# Patient Record
Sex: Male | Born: 1955
Health system: Southern US, Community
[De-identification: ages and names within clinical notes are randomized; demographics above are authoritative.]

## PROBLEM LIST (undated history)

## (undated) DIAGNOSIS — K635 Polyp of colon: Secondary | ICD-10-CM

## (undated) DIAGNOSIS — R011 Cardiac murmur, unspecified: Secondary | ICD-10-CM

## (undated) DIAGNOSIS — K219 Gastro-esophageal reflux disease without esophagitis: Secondary | ICD-10-CM

## (undated) DIAGNOSIS — I341 Nonrheumatic mitral (valve) prolapse: Secondary | ICD-10-CM

## (undated) DIAGNOSIS — I340 Nonrheumatic mitral (valve) insufficiency: Secondary | ICD-10-CM

## (undated) DIAGNOSIS — I2510 Atherosclerotic heart disease of native coronary artery without angina pectoris: Secondary | ICD-10-CM

## (undated) DIAGNOSIS — I4819 Other persistent atrial fibrillation: Secondary | ICD-10-CM

## (undated) DIAGNOSIS — I4891 Unspecified atrial fibrillation: Secondary | ICD-10-CM

## (undated) HISTORY — DX: Polyp of colon: K63.5

## (undated) HISTORY — DX: Cardiac murmur, unspecified: R01.1

## (undated) HISTORY — PX: POLYPECTOMY: SHX149

## (undated) HISTORY — DX: Gastro-esophageal reflux disease without esophagitis: K21.9

## (undated) HISTORY — DX: Unspecified atrial fibrillation: I48.91

## (undated) HISTORY — DX: Nonrheumatic mitral (valve) insufficiency: I34.0

## (undated) HISTORY — PX: COLONOSCOPY: SHX174

## (undated) HISTORY — PX: CARDIAC CATHETERIZATION: SHX172

## (undated) HISTORY — PX: UPPER GASTROINTESTINAL ENDOSCOPY: SHX188

---

## 1980-12-11 HISTORY — PX: APPENDECTOMY: SHX54

## 2003-08-30 ENCOUNTER — Emergency Department (HOSPITAL_COMMUNITY): Admission: EM | Admit: 2003-08-30 | Discharge: 2003-08-30 | Payer: Self-pay | Admitting: Emergency Medicine

## 2003-09-04 ENCOUNTER — Ambulatory Visit (HOSPITAL_COMMUNITY): Admission: RE | Admit: 2003-09-04 | Discharge: 2003-09-04 | Payer: Self-pay | Admitting: *Deleted

## 2004-10-27 ENCOUNTER — Ambulatory Visit: Payer: Self-pay | Admitting: Gastroenterology

## 2006-12-11 HISTORY — PX: OTHER SURGICAL HISTORY: SHX169

## 2006-12-11 HISTORY — PX: HIP SURGERY: SHX245

## 2007-04-08 ENCOUNTER — Ambulatory Visit: Payer: Self-pay | Admitting: Gastroenterology

## 2008-04-07 ENCOUNTER — Encounter: Admission: RE | Admit: 2008-04-07 | Discharge: 2008-04-07 | Payer: Self-pay | Admitting: Orthopedic Surgery

## 2008-06-10 DIAGNOSIS — D126 Benign neoplasm of colon, unspecified: Secondary | ICD-10-CM

## 2008-06-10 HISTORY — DX: Benign neoplasm of colon, unspecified: D12.6

## 2008-06-22 ENCOUNTER — Ambulatory Visit: Payer: Self-pay | Admitting: Gastroenterology

## 2008-07-08 ENCOUNTER — Ambulatory Visit: Payer: Self-pay | Admitting: Gastroenterology

## 2008-07-08 ENCOUNTER — Encounter: Payer: Self-pay | Admitting: Gastroenterology

## 2008-08-06 ENCOUNTER — Encounter: Payer: Self-pay | Admitting: Gastroenterology

## 2011-01-01 ENCOUNTER — Encounter: Payer: Self-pay | Admitting: Orthopedic Surgery

## 2011-04-28 NOTE — Assessment & Plan Note (Signed)
[facility]                         GASTROENTEROLOGY OFFICE NOTE   NAME:Daniel Browning, Daniel Browning                        MRN:          [id number]  DATE:04/08/2007                            DOB:          Apr 28, 1956    This is a return office visit for GERD.  Daniel Browning recently switched to  generic pantoprazole and noted several days where his symptoms did not  appear to be adequately controlled, but this resolved and now his  symptoms remain under very good control.  He has no dysphagia,  odynophagia, abdominal pain, change in bowel habits, weight loss, melena  or hematochezia.  He has not previously had upper endoscopy.   CURRENT MEDICATIONS:  1. Aspirin 81 mg daily.  2. Pantoprazole 40 mg daily.  3. Fexofenadine 80 mg daily p.r.n.   ALLERGIES:  Medication allergies:  None known.   PHYSICAL EXAMINATION:  No acute distress.  Weight 187 pounds, blood  pressure 112/78, pulse 68 and regular.  CHEST:  Clear to auscultation bilaterally.  CARDIAC:  Regular rate and rhythm without murmurs.  ABDOMEN:  Soft, nontender, nondistended, normoactive bowel sounds.  No  palpable organomegaly, masses or hernias.   ASSESSMENT AND PLAN:  1. GERD.  Symptoms under excellent control.  Continue standard anti-      reflux measures and pantoprazole 40 mg p.o. q.a.m.  Refills for 2      years supplied.  Plan for return office visit in 2 years.  If his      symptoms remain under excellent control he may try to decrease his      dosing by changing to every other day and if his symptoms remain      under good control every third day.  If he tolerates this regimen      he can try discontinuing the medication.  If he remains on      medication to control reflux, consider upper endoscopy within the      next few years to screen for Barrett's esophagus.  2. Colorectal cancer screening.  Average risk.  Colonoscopy      recommended at age 29.     Venita Lick. Russella Dar, MD, [facility]  Electronically  Signed    MTS/MedQ  DD: 04/08/2007  DT: 04/08/2007  Job #: [id number]

## 2011-04-28 NOTE — Cardiovascular Report (Signed)
NAME:  Daniel Browning, Daniel Browning                           ACCOUNT NO.:  [id number]   MEDICAL RECORD NO.:  [id number]                   PATIENT TYPE:  OIB   LOCATION:  2899                                 FACILITY:  [facility]   PHYSICIAN:  Darlin Priestly, M.D.             DATE OF BIRTH:  [birth year]   DATE OF PROCEDURE:  09/04/2003  DATE OF DISCHARGE:                              CARDIAC CATHETERIZATION   PROCEDURES PERFORMED:  1. Left heart catheterization.  2. Coronary angiography.  3. Left ventriculogram.  4. Ascending aortography.   COMPLICATIONS:  None.   INDICATIONS:  Mr. Sayre is a 55 year old male patient of Meredith Staggers, M.D. with a positive family history of CAD who recently complained  of substernal chest pain after doing yard work.  This is somewhat atypical  and is worth with deep breathing and palpation of the area.  However,  continued to persist with associated nausea and diaphoresis.  We did ask him  to undergo Cardiolite scan on September 01, 2003 which revealed mild  anterolateral ischemia at the apex with normal EF.  He is now referred for  cardiac catheterization to define his coronary status.   DESCRIPTION OF OPERATION:  After giving informed written consent patient was  brought to the cardiac catheterization laboratory.  Right and left groin  shaved, prepped and draped in usual sterile fashion.  ECG monitor  established.  Using a modified Seldinger technique, a number 6-French  arterial sheath inserted in right femoral artery.  A 6-French diagnostic  catheter was then used to perform diagnostic angiography.  This reveals a  large left main with no significant disease.  The LAD is a large sized  vessel which coursed the apex, gave rise to two diagonal branches.  There is  mild calcification in the proximal part of the LAD.  There is mild 30%  narrowing in the proximal LAD with 40% mid LAD narrowing.  The first  diagonal is a large vessel which bifurcates  distally, has a 40% ostial  narrowing.  The second diagonal is a medium sized vessel with no significant  disease.   Left circumflex is a large vessel coursing the AV groove and gave rise to  two obtuse marginal branches.  AV groove circumflex has no significant  disease.  First OM is a large vessel which bifurcates distally.  Has no  significant disease.  The second OM is a large vessel bifurcates in its  proximal portion with no significant disease.   The right coronary artery is a medium sized vessel which is dominant.  Gives  rise to a PDA/posterolateral branch.  There is no significant disease in the  RCA, PDA, or posterolateral branch.   Left ventriculogram reveals low normal EF at 50%.  There did not appear to  be any segmental wall motion abnormalities noted.   Ascending aortography reveals no evidence of aortic dissection or aortic  regurgitation.   HEMODYNAMICS:  Systemic arterial pressure 118/72, LV systemic pressure  117/7, LVEDP 13.   Following the case the right femoral site was then closed using AngioSeal  device to obtain hemostasis.  There were no complications.   CONCLUSION:  1. No significant coronary artery disease.  2. Normal left ventricular systolic function.  3. No evidence of aortic dissection.  4. Successful right femoral closure using an AngioSeal device.                                               Darlin Priestly, M.D.    RHM/MEDQ  D:  09/04/2003  T:  09/04/2003  Job:  [id number]   cc:   Meredith Staggers, M.D.  [address]  [place]  [place] [address]  Fax: [phone]

## 2013-01-07 ENCOUNTER — Other Ambulatory Visit: Payer: Self-pay | Admitting: Dermatology

## 2013-07-02 ENCOUNTER — Encounter: Payer: Self-pay | Admitting: Gastroenterology

## 2015-01-30 ENCOUNTER — Encounter: Payer: Self-pay | Admitting: Gastroenterology

## 2015-05-20 ENCOUNTER — Encounter: Payer: Self-pay | Admitting: Gastroenterology

## 2016-07-14 ENCOUNTER — Encounter: Payer: Self-pay | Admitting: Gastroenterology

## 2016-09-05 ENCOUNTER — Ambulatory Visit (AMBULATORY_SURGERY_CENTER): Payer: Self-pay | Admitting: *Deleted

## 2016-09-05 VITALS — Ht 70.0 in | Wt 189.0 lb

## 2016-09-05 DIAGNOSIS — Z86010 Personal history of colonic polyps: Secondary | ICD-10-CM

## 2016-09-05 MED ORDER — NA SULFATE-K SULFATE-MG SULF 17.5-3.13-1.6 GM/177ML PO SOLN: 1.0000 | Freq: Once | ORAL | 0 refills | Status: AC

## 2016-09-05 NOTE — Progress Notes (Signed)
No egg or soy allergy known to patient  No issues with past sedation with any surgeries  or procedures, no intubation problems  No diet pills per patient No home 02 use per patient  No blood thinners per patient  Pt denies issues with constipation  No A fib or A flutter   

## 2016-09-06 ENCOUNTER — Encounter: Payer: Self-pay | Admitting: Gastroenterology

## 2016-09-19 ENCOUNTER — Encounter: Payer: Self-pay | Admitting: Gastroenterology

## 2017-10-04 ENCOUNTER — Other Ambulatory Visit: Payer: Self-pay

## 2017-10-04 ENCOUNTER — Encounter (HOSPITAL_COMMUNITY): Payer: Self-pay | Admitting: *Deleted

## 2017-10-04 ENCOUNTER — Emergency Department (HOSPITAL_COMMUNITY)
Admission: EM | Admit: 2017-10-04 | Discharge: 2017-10-04 | Disposition: A | Discharge status: Home / Self Care | Payer: 59 | Attending: Emergency Medicine | Admitting: Emergency Medicine

## 2017-10-04 ENCOUNTER — Emergency Department (HOSPITAL_COMMUNITY): Payer: 59

## 2017-10-04 DIAGNOSIS — Z79899 Other long term (current) drug therapy: Secondary | ICD-10-CM | POA: Insufficient documentation

## 2017-10-04 DIAGNOSIS — R55 Syncope and collapse: Secondary | ICD-10-CM | POA: Diagnosis present

## 2017-10-04 LAB — URINALYSIS, ROUTINE W REFLEX MICROSCOPIC
Bilirubin Urine: NEGATIVE
Glucose, UA: NEGATIVE mg/dL
Hgb urine dipstick: NEGATIVE
Ketones, ur: NEGATIVE mg/dL
Leukocytes, UA: NEGATIVE
Nitrite: NEGATIVE
Protein, ur: 100 mg/dL — AB
Specific Gravity, Urine: 1.028 (ref 1.005–1.030)
pH: 5 (ref 5.0–8.0)

## 2017-10-04 LAB — CBC
HEMATOCRIT: 34.2 % — AB (ref 39.0–52.0)
Hemoglobin: 11.2 g/dL — ABNORMAL LOW (ref 13.0–17.0)
MCH: 26.5 pg (ref 26.0–34.0)
MCHC: 32.7 g/dL (ref 30.0–36.0)
MCV: 81 fL (ref 78.0–100.0)
Platelets: 231 10*3/uL (ref 150–400)
RBC: 4.22 MIL/uL (ref 4.22–5.81)
RDW: 13.4 % (ref 11.5–15.5)
WBC: 13.4 10*3/uL — ABNORMAL HIGH (ref 4.0–10.5)

## 2017-10-04 LAB — BASIC METABOLIC PANEL
Anion gap: 10 (ref 5–15)
BUN: 20 mg/dL (ref 6–20)
CO2: 26 mmol/L (ref 22–32)
Calcium: 9.3 mg/dL (ref 8.9–10.3)
Chloride: 102 mmol/L (ref 101–111)
Creatinine, Ser: 0.99 mg/dL (ref 0.61–1.24)
GFR calc Af Amer: >60 mL/min (ref >60)
GFR calc non Af Amer: >60 mL/min (ref >60)
Glucose, Bld: 100 mg/dL — ABNORMAL HIGH (ref 65–99)
Potassium: 4 mmol/L (ref 3.5–5.1)
Sodium: 138 mmol/L (ref 135–145)

## 2017-10-04 NOTE — ED Provider Notes (Signed)
[facility] EMERGENCY DEPARTMENT Provider Note   CSN: [id number] Arrival date & time: 10/04/17  1819     History   Chief Complaint Chief Complaint  Patient presents with  . Loss of Consciousness    HPI Daniel Browning is a 61 y.o. male.  Patient is a 61 year old male with no significant past medical history presenting for evaluation of syncope.  He was at work this evening receiving a massage that consisted of vigorous rubbing of his upper back and on deep inspiration.  After this was ongoing for several minutes, he became lightheaded, then experienced a syncopal episode.  He lost consciousness for approximately 30 seconds, then woke.  When waking, he was alert, appropriate.  There is no reported seizure-like activity and no bowel or bladder incontinence.  He now feels fine and has no complaints.  He denies any complaints leading up to this episode.   The history is provided by the patient.  Loss of Consciousness   This is a new problem. The current episode started less than 1 hour ago. The problem has been resolved. He lost consciousness for a period of less than one minute. Associated with: Massage as above. Pertinent negatives include chest pain, confusion, fever, headaches, palpitations and seizures. He has tried nothing for the symptoms.    Past Medical History:  Diagnosis Date  . Colon polyps   . GERD (gastroesophageal reflux disease)   . Heart murmur     There are no active problems to display for this patient.   Past Surgical History:  Procedure Laterality Date  . APPENDECTOMY  1982  . COLONOSCOPY    . HIP SURGERY Right 2008   8-9 screws- s/p 30 ft fall  . POLYPECTOMY    . radial head surgery Right 2008   from 30 ft. fall-   . UPPER GASTROINTESTINAL ENDOSCOPY         Home Medications    Prior to Admission medications   Medication Sig Start Date End Date Taking? Authorizing Provider  omeprazole (PRILOSEC OTC) 20 MG tablet Take 20 mg by  mouth daily.   Yes [provider]    Family History Family History  Problem Relation Age of Onset  . Lung cancer Mother 33  . Aneurysm Father 64  . Colon polyps Neg Hx   . Colon cancer Neg Hx   . Esophageal cancer Neg Hx   . Rectal cancer Neg Hx   . Stomach cancer Neg Hx     Social History Social History  Substance Use Topics  . Smoking status: Never Smoker  . Smokeless tobacco: Never Used  . Alcohol use Yes     Comment: occasional- 2-3 beers a month      Allergies   Patient has no known allergies.   Review of Systems Review of Systems  Constitutional: Negative for fever.  Cardiovascular: Positive for syncope. Negative for chest pain and palpitations.  Neurological: Negative for seizures and headaches.  Psychiatric/Behavioral: Negative for confusion.  All other systems reviewed and are negative.    Physical Exam Updated Vital Signs BP 134/85 (BP Location: Left Arm)   Pulse 61   Temp (!) 97.4 F (36.3 C) (Oral)   SpO2 100%   Physical Exam  Constitutional: He is oriented to person, place, and time. He appears well-developed and well-nourished. No distress.  HENT:  Head: Normocephalic and atraumatic.  Mouth/Throat: Oropharynx is clear and moist.  Eyes: Pupils are equal, round, and reactive to light. EOM are  normal.  Neck: Normal range of motion. Neck supple.  Cardiovascular: Normal rate and regular rhythm.  Exam reveals no friction rub.   No murmur heard. Pulmonary/Chest: Effort normal and breath sounds normal. No respiratory distress. He has no wheezes. He has no rales.  Abdominal: Soft. Bowel sounds are normal. He exhibits no distension. There is no tenderness.  Musculoskeletal: Normal range of motion. He exhibits no edema.  Neurological: He is alert and oriented to person, place, and time. No cranial nerve deficit. He exhibits normal muscle tone. Coordination normal.  Skin: Skin is warm and dry. He is not diaphoretic.  Nursing note and vitals  reviewed.    ED Treatments / Results  Labs (all labs ordered are listed, but only abnormal results are displayed) Labs Reviewed  BASIC METABOLIC PANEL - Abnormal; Notable for the following:       Result Value   Glucose, Bld 100 (*)    All other components within normal limits  CBC - Abnormal; Notable for the following:    WBC 13.4 (*)    Hemoglobin 11.2 (*)    HCT 34.2 (*)    All other components within normal limits  URINALYSIS, ROUTINE W REFLEX MICROSCOPIC - Abnormal; Notable for the following:    APPearance HAZY (*)    Protein, ur 100 (*)    Bacteria, UA RARE (*)    Squamous Epithelial / LPF 0-5 (*)    All other components within normal limits  CBG MONITORING, ED    EKG  EKG Interpretation  Date/Time:  Thursday October 04 2017 18:35:46 EDT Ventricular Rate:  58 PR Interval:  194 QRS Duration: 112 QT Interval:  424 QTC Calculation: 416 R Axis:   71 Text Interpretation:  Sinus bradycardia with occasional Premature ventricular complexes Incomplete left bundle branch block Nonspecific ST abnormality Abnormal ECG Confirmed by Veryl Speak [phone]) on 10/04/2017 11:21:45 PM       Radiology Dg Chest 2 View  Result Date: 10/04/2017 CLINICAL DATA:  Syncopal episode today. EXAM: CHEST  2 VIEW COMPARISON:  None. FINDINGS: The heart size and mediastinal contours are within normal limits. There is no focal infiltrate, pulmonary edema, or pleural effusion. The visualized skeletal structures are unremarkable. IMPRESSION: No active cardiopulmonary disease. Electronically Signed   By: Abelardo Diesel M.D.   On: 10/04/2017 19:36    Procedures Procedures (including critical care time)  Medications Ordered in ED Medications - No data to display   Initial Impression / Assessment and Plan / ED Course  I have reviewed the triage vital signs and the nursing notes.  Pertinent labs & imaging results that were available during my care of the patient were reviewed by me and considered  in my medical decision making (see chart for details).  Patient brought here after a syncopal episode that sounds vasovagal in nature.  He reports vigorous stimulation of his back muscles during a massage along with taking deep inspirations.  His workup reveals normal laboratory studies, normal EKG, and normal neurologic exam.  I see no indication for further workup.  Patient is feeling well and I believe is appropriate for discharge.  To return as needed/follow-up for any problems.  Final Clinical Impressions(s) / ED Diagnoses   Final diagnoses:  None    New Prescriptions New Prescriptions   No medications on file     Veryl Speak, MD 10/04/17 2341

## 2017-10-04 NOTE — Discharge Instructions (Signed)
Return to the emergency department if you experience any new or concerning symptoms.

## 2017-10-04 NOTE — ED Triage Notes (Addendum)
To ED for eval after having a syncopal episode during a massage while at work. No cp. No sob. Now feels nauseated. States he felt fine prior to massage. EMS on scene told pt his bp was low at 90/p. Wife states pt has had a cough over the past couple of days. Denies fever

## 2017-10-17 MED ORDER — FENTANYL CITRATE (PF) 100 MCG/2ML IJ SOLN
INTRAMUSCULAR | Status: AC
  Filled 2017-10-17: qty 2

## 2019-04-02 DIAGNOSIS — L309 Dermatitis, unspecified: Secondary | ICD-10-CM | POA: Diagnosis not present

## 2019-04-30 DIAGNOSIS — L309 Dermatitis, unspecified: Secondary | ICD-10-CM | POA: Diagnosis not present

## 2019-07-14 DIAGNOSIS — L2089 Other atopic dermatitis: Secondary | ICD-10-CM | POA: Diagnosis not present

## 2019-11-05 DIAGNOSIS — Z0000 Encounter for general adult medical examination without abnormal findings: Secondary | ICD-10-CM | POA: Diagnosis not present

## 2019-11-05 DIAGNOSIS — R079 Chest pain, unspecified: Secondary | ICD-10-CM | POA: Diagnosis not present

## 2019-11-05 DIAGNOSIS — Z125 Encounter for screening for malignant neoplasm of prostate: Secondary | ICD-10-CM | POA: Diagnosis not present

## 2019-11-05 DIAGNOSIS — R5383 Other fatigue: Secondary | ICD-10-CM | POA: Diagnosis not present

## 2019-11-10 DIAGNOSIS — E7800 Pure hypercholesterolemia, unspecified: Secondary | ICD-10-CM | POA: Diagnosis not present

## 2019-12-23 ENCOUNTER — Ambulatory Visit: Payer: Self-pay | Attending: Internal Medicine

## 2019-12-24 ENCOUNTER — Ambulatory Visit: Payer: Self-pay | Admitting: Cardiology

## 2019-12-30 DIAGNOSIS — Z23 Encounter for immunization: Secondary | ICD-10-CM | POA: Diagnosis not present

## 2020-01-06 NOTE — Progress Notes (Deleted)
    Patient referred by Daniel Arabian, MD for exertional chest pain  Subjective:   Daniel Browning, male    DOB: 1956/10/15, 64 y.o.   MRN: [id number]  *** Chief Complaint  Patient presents with  . exertional Chest Pain  . Heart Murmur    *** HPI  64 y.o. *** male with ***  *** Past Medical History:  Diagnosis Date  . Colon polyps   . GERD (gastroesophageal reflux disease)   . Heart murmur     *** Past Surgical History:  Procedure Laterality Date  . APPENDECTOMY  1982  . COLONOSCOPY    . HIP SURGERY Right 2008   8-9 screws- s/p 30 ft fall  . POLYPECTOMY    . radial head surgery Right 2008   from 30 ft. fall-   . UPPER GASTROINTESTINAL ENDOSCOPY      *** Social History   Tobacco Use  Smoking Status Never Smoker  Smokeless Tobacco Never Used    Social History   Substance and Sexual Activity  Alcohol Use Yes   Comment: occasional- 2-3 beers a month     *** Family History  Problem Relation Age of Onset  . Lung cancer Mother 63  . Aneurysm Father 65  . Colon polyps Neg Hx   . Colon cancer Neg Hx   . Esophageal cancer Neg Hx   . Rectal cancer Neg Hx   . Stomach cancer Neg Hx     *** Current Outpatient Medications on File Prior to Visit  Medication Sig Dispense Refill  . omeprazole (PRILOSEC OTC) 20 MG tablet Take 20 mg by mouth daily.     No current facility-administered medications on file prior to visit.    Cardiovascular and other pertinent studies:  *** EKG 01/07/2020: Atrial fibrillation with controlled ventricular rate 85 bpm. Occasional ectopic ventricular beat. Poor R wave progression.  Coronary angiogram 2012: LM: Normal LAD: Mild calcification with 30% prox stenosis, mid 40% stenosis, large D1 40% ostial stenosis LCx: Normal RCA: Normal Mild nonobstructive CAD Normal left ventricular systolic function. No evidence of aortic dissection.   *** Recent labs: 11/05/2019: Glucose 93, BUN/Cr 23/0.8. EGFR normal. Na/K 140/4.3.  Rest of the CMP normal H/H 13.7/39.6. MCV 88.2. Platelets 192 Chol 222, TG 65, HDL 65, LDL 145  *** ROS      *** Vitals:   01/07/20 0812 01/07/20 0816  BP: (!) 150/105 (!) 133/93  Pulse: 69   SpO2: 98%     *** Body mass index is 27.98 kg/m. Filed Weights   01/07/20 0812  Weight: 195 lb (88.5 kg)    *** Objective:   Physical Exam    ***     Assessment & Recommendations:   ***  *** CHA2DS2VAsc score 1, annual stroke risk 0.6%.   *** 10 yr ASCVD risk      Thank you for referring the patient to [place]. Please feel free to contact with any questions.  Nigel Mormon, MD [facility] [place] Pager: [phone] Office: [phone]

## 2020-01-07 ENCOUNTER — Encounter: Payer: Self-pay | Admitting: Cardiology

## 2020-01-07 ENCOUNTER — Ambulatory Visit (INDEPENDENT_AMBULATORY_CARE_PROVIDER_SITE_OTHER): Payer: BC Managed Care – PPO | Admitting: Cardiology

## 2020-01-07 ENCOUNTER — Other Ambulatory Visit: Payer: Self-pay

## 2020-01-07 VITALS — BP 133/93 | HR 69 | Ht 70.0 in | Wt 195.0 lb

## 2020-01-07 DIAGNOSIS — I208 Other forms of angina pectoris: Secondary | ICD-10-CM | POA: Diagnosis not present

## 2020-01-07 DIAGNOSIS — I4819 Other persistent atrial fibrillation: Secondary | ICD-10-CM | POA: Insufficient documentation

## 2020-01-07 DIAGNOSIS — I4891 Unspecified atrial fibrillation: Secondary | ICD-10-CM | POA: Diagnosis not present

## 2020-01-07 DIAGNOSIS — R079 Chest pain, unspecified: Secondary | ICD-10-CM | POA: Insufficient documentation

## 2020-01-07 DIAGNOSIS — I340 Nonrheumatic mitral (valve) insufficiency: Secondary | ICD-10-CM | POA: Insufficient documentation

## 2020-01-07 DIAGNOSIS — R011 Cardiac murmur, unspecified: Secondary | ICD-10-CM

## 2020-01-07 MED ORDER — NITROGLYCERIN 0.4 MG SL SUBL: 0.4000 mg | SUBLINGUAL_TABLET | SUBLINGUAL | 3 refills | Status: DC | PRN

## 2020-01-07 MED ORDER — RIVAROXABAN 20 MG PO TABS: 20.0000 mg | ORAL_TABLET | Freq: Every day | ORAL | 2 refills | Status: DC

## 2020-01-07 NOTE — Progress Notes (Signed)
Patient referred by [name], MD for exertional chest pain  Subjective:   Daniel Browning, male    DOB: 1956/01/25, 64 y.o.   MRN: [id number]   Chief Complaint  Patient presents with  . exertional Chest Pain  . Heart Murmur    HPI  64 y.o. Caucasian male with mild nonobstructive coronary artery disease (Cath 2003), known heart murmur, referred for evaluation of exertional chest pain.  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  He has been active throughout his life.  Starting spring 2020, he has experienced exertional chest tightness.  Symptoms do not occur at rest.  While he does have symptoms on exertion, he is usually able to continue his walking through it.  He has had improvement in his symptoms over the last month or so.  For example, patient walked for up to an hour for 3 miles yesterday, without any symptoms of chest tightness.  He does have exertional dyspnea over the last year or so, which has stayed stable.  Patient was known to have heart murmur throughout his life.  He has not had any echocardiogram in the recent past.  He underwent coronary angiogram after an abnormal Cardiolite stress test in 2003, by Dr. Tami Ribas.  Coronary angiogram showed mild nonobstructive coronary artery disease, details below.  His blood pressure is elevated today.  He does not have elevated blood pressure at baseline.  On a separate note, he has acid reflux which is treated with omeprazole.   Past Medical History:  Diagnosis Date  . Colon polyps   . GERD (gastroesophageal reflux disease)   . Heart murmur      Past Surgical History:  Procedure Laterality Date  . APPENDECTOMY  1982  . COLONOSCOPY    . HIP SURGERY Right 2008   8-9 screws- s/p 30 ft fall  . POLYPECTOMY    . radial head surgery Right 2008   from 30 ft. fall-   . UPPER GASTROINTESTINAL ENDOSCOPY       Social History   Tobacco Use  Smoking Status Never Smoker  Smokeless Tobacco Never Used    Social History     Substance and Sexual Activity  Alcohol Use Yes   Comment: occasional- 2-3 beers a month     Family History  Problem Relation Age of Onset  . Lung cancer Mother 67  . Aneurysm Father 79  . Colon polyps Neg Hx   . Colon cancer Neg Hx   . Esophageal cancer Neg Hx   . Rectal cancer Neg Hx   . Stomach cancer Neg Hx      Current Outpatient Medications on File Prior to Visit  Medication Sig Dispense Refill  . omeprazole (PRILOSEC OTC) 20 MG tablet Take 20 mg by mouth daily.     No current facility-administered medications on file prior to visit.    Cardiovascular and other pertinent studies:  EKG 01/07/2020: Atrial fibrillation with controlled ventricular rate 85 bpm. Occasional ectopic ventricular beat. Poor R wave progression.  Coronary angiogram 2012: LM: Normal LAD: Mild calcification with 30% prox stenosis, mid 40% stenosis, large D1 40% ostial stenosis LCx: Normal RCA: Normal Mild nonobstructive CAD Normal left ventricular systolic function. No evidence of aortic dissection.  Recent labs: 11/05/2019: Glucose 93, BUN/Cr 23/0.8. EGFR normal. Na/K 140/4.3. Rest of the CMP normal H/H 13.7/39.6. MCV 88.2. Platelets 192 Chol 222, TG 65, HDL 65, LDL 145   Review of Systems  Cardiovascular: Positive for chest pain (Exertional) and dyspnea  on exertion. Negative for leg swelling, palpitations and syncope.        Vitals:   01/07/20 0812 01/07/20 0816  BP: (!) 150/105 (!) 133/93  Pulse: 69   SpO2: 98%      Body mass index is 27.98 kg/m. Filed Weights   01/07/20 0812  Weight: 195 lb (88.5 kg)     Objective:   Physical Exam  Constitutional: He appears well-developed and well-nourished.  Neck: No JVD present.  Cardiovascular: Normal rate and intact distal pulses. An irregularly irregular rhythm present.  Murmur heard. High-pitched holosystolic murmur is present with a grade of 4/6 radiating to the neck and apex. Carotid bruit is likely conducted  murmur Pulses:      Carotid pulses are on the left side with bruit. Pulmonary/Chest: Effort normal and breath sounds normal. He has no wheezes. He has no rales.  Musculoskeletal:        General: No edema.  Nursing note and vitals reviewed.       Assessment & Recommendations:   64 y.o. Caucasian male with mild nonobstructive coronary artery disease (Cath 2003), known heart murmur, referred for evaluation of exertional chest pain, found to have atrial fibrillaiton  Atrial fibrillation: New finding.  Unclear chronicity.  Rate very well controlled without any AV nodal blocking agents. CHA2DS2VASc score 1, annual stroke risk 0.6%.  While he does have mild coronary artery disease, he is not had any myocardial infarction. While his overall stroke risk remains low, I recommend Xarelto 20 mg daily for the following reason.  He is relatively young and healthy, and has had exertional chest pain and dyspnea.  I do think he will benefit from early conversion to sinus rhythm.  Therefore, I recommend cardioversion in 4 weeks after adequate anticoagulation.  He will need anticoagulation for at least 4 weeks after the cardioversion to reduce stroke risk.  Exertional chest pain: Concerning for angina.  His symptoms lasted throughout spring and summer, but seem to have improved recently.  He does have known mild coronary artery disease as of coronary angiogram in 2003.  Stress test was abnormal back then prior to his coronary angiogram.  Therefore, stress test now will not provide any incremental information.  Calcium score is a moot point given that he did have mild obstructive CAD even in 2003.  CT angiogram would be ideal to evaluate for any obstructive CAD.  However, unable to do so currently due to his atrial fibrillation.  Therefore, I recommend CT angiogram after conversion to sinus rhythm.  He is reluctant to start statin and/or antianginal therapy at this time.  We have mutually agreed on sublingual  nitroglycerin for as needed use, at very least.  Given use of Xarelto, I would avoid aspirin at this time.  Systolic murmur: I suspect he may have mitral valve prolapse and mitral regurgitation.  Other differential is aortic stenosis, although less likely.  I will obtain echocardiogram.  If he does have mitral regurgitation, I suspect that may be driving his atrial fibrillation.  Further recommendations after above testing.  Thank you for referring the patient to [place]. Please feel free to contact with any questions.  Nigel Mormon, MD [facility] Pager: [phone] Office: [phone]

## 2020-01-14 ENCOUNTER — Other Ambulatory Visit: Payer: Self-pay

## 2020-01-14 ENCOUNTER — Ambulatory Visit (INDEPENDENT_AMBULATORY_CARE_PROVIDER_SITE_OTHER): Payer: BC Managed Care – PPO

## 2020-01-14 DIAGNOSIS — I208 Other forms of angina pectoris: Secondary | ICD-10-CM | POA: Diagnosis not present

## 2020-01-14 DIAGNOSIS — I4891 Unspecified atrial fibrillation: Secondary | ICD-10-CM | POA: Diagnosis not present

## 2020-01-15 ENCOUNTER — Ambulatory Visit: Payer: BC Managed Care – PPO | Attending: Internal Medicine

## 2020-01-15 DIAGNOSIS — Z20822 Contact with and (suspected) exposure to covid-19: Secondary | ICD-10-CM

## 2020-01-16 ENCOUNTER — Other Ambulatory Visit: Payer: BC Managed Care – PPO

## 2020-01-16 LAB — NOVEL CORONAVIRUS, NAA: SARS-CoV-2, NAA: NOT DETECTED

## 2020-01-20 DIAGNOSIS — R05 Cough: Secondary | ICD-10-CM | POA: Diagnosis not present

## 2020-01-20 DIAGNOSIS — K219 Gastro-esophageal reflux disease without esophagitis: Secondary | ICD-10-CM | POA: Diagnosis not present

## 2020-01-20 DIAGNOSIS — I4891 Unspecified atrial fibrillation: Secondary | ICD-10-CM | POA: Diagnosis not present

## 2020-01-21 ENCOUNTER — Telehealth: Payer: Self-pay

## 2020-01-21 ENCOUNTER — Other Ambulatory Visit: Payer: BC Managed Care – PPO

## 2020-01-21 ENCOUNTER — Telehealth: Payer: Self-pay | Admitting: Cardiology

## 2020-01-21 DIAGNOSIS — I340 Nonrheumatic mitral (valve) insufficiency: Secondary | ICD-10-CM

## 2020-01-21 NOTE — Telephone Encounter (Signed)
Discussed the echo findings of severe primary MR. Will cancel the cardioversion and CTA. Instead, will perform TEE and RHC/LHC and refer to Dr. Roxy Manns to discuss mitral valve repair.  Will schedule for 2/23.  Nigel Mormon, MD

## 2020-01-21 NOTE — Telephone Encounter (Signed)
Please send lasix 20 mg 30 pills 1 refill.  Thanks MJP

## 2020-01-21 NOTE — Telephone Encounter (Signed)
Pt called back to inform us that he would like to start on a fluid pill that you had suggest. Please advise. Thank you

## 2020-01-21 NOTE — Telephone Encounter (Signed)
TEE at 9:15am on 2/23  L & R CATH at 12pm on 2/23  (Dr Einar Gip has procedures in between) CTA- CANCELLED for 2/25 [place] Patient aware    Spoke with the patient- advised he is to hold [place] 2 days from procedure (Starting on Sunday Feb 21st)   Please advice if changes need to be made, thank you.   Leda Quail

## 2020-01-22 MED ORDER — FUROSEMIDE 20 MG PO TABS: 20.0000 mg | ORAL_TABLET | Freq: Every day | ORAL | 3 refills | Status: DC

## 2020-01-22 NOTE — Telephone Encounter (Signed)
Ok done and pt is infomred

## 2020-01-27 DIAGNOSIS — Z23 Encounter for immunization: Secondary | ICD-10-CM | POA: Diagnosis not present

## 2020-01-29 DIAGNOSIS — I342 Nonrheumatic mitral (valve) stenosis: Secondary | ICD-10-CM | POA: Diagnosis not present

## 2020-01-29 DIAGNOSIS — I340 Nonrheumatic mitral (valve) insufficiency: Secondary | ICD-10-CM | POA: Diagnosis not present

## 2020-01-30 ENCOUNTER — Other Ambulatory Visit (HOSPITAL_COMMUNITY)
Admission: RE | Admit: 2020-01-30 | Discharge: 2020-01-30 | Disposition: A | Discharge status: Home / Self Care | Payer: BC Managed Care – PPO | Source: Ambulatory Visit | Attending: Cardiology | Admitting: Cardiology

## 2020-01-30 ENCOUNTER — Other Ambulatory Visit (HOSPITAL_COMMUNITY): Payer: Self-pay | Admitting: Cardiology

## 2020-01-30 DIAGNOSIS — Z20822 Contact with and (suspected) exposure to covid-19: Secondary | ICD-10-CM | POA: Insufficient documentation

## 2020-01-30 DIAGNOSIS — Z01812 Encounter for preprocedural laboratory examination: Secondary | ICD-10-CM | POA: Insufficient documentation

## 2020-01-30 DIAGNOSIS — I4891 Unspecified atrial fibrillation: Secondary | ICD-10-CM | POA: Diagnosis not present

## 2020-01-30 LAB — SARS CORONAVIRUS 2 (TAT 6-24 HRS): SARS Coronavirus 2: NEGATIVE

## 2020-02-02 LAB — BASIC METABOLIC PANEL
BUN/Creatinine Ratio: 14 (ref 10–24)
BUN: 17 mg/dL (ref 8–27)
CO2: 22 mmol/L (ref 20–29)
Calcium: 9.7 mg/dL (ref 8.6–10.2)
Chloride: 102 mmol/L (ref 96–106)
Creatinine, Ser: 1.19 mg/dL (ref 0.76–1.27)
GFR calc Af Amer: 75 mL/min/{1.73_m2} (ref >59)
GFR calc non Af Amer: 65 mL/min/{1.73_m2} (ref >59)
Glucose: 87 mg/dL (ref 65–99)
Potassium: 4.9 mmol/L (ref 3.5–5.2)
Sodium: 143 mmol/L (ref 134–144)

## 2020-02-03 ENCOUNTER — Encounter (HOSPITAL_COMMUNITY)
Admission: RE | Disposition: A | Discharge status: Home / Self Care | Payer: Self-pay | Source: Home / Self Care | Attending: Cardiology

## 2020-02-03 ENCOUNTER — Ambulatory Visit (HOSPITAL_COMMUNITY)
Admission: RE | Admit: 2020-02-03 | Discharge: 2020-02-03 | Disposition: A | Discharge status: Home / Self Care | Payer: BC Managed Care – PPO | Attending: Cardiology | Admitting: Cardiology

## 2020-02-03 ENCOUNTER — Ambulatory Visit (HOSPITAL_COMMUNITY): Payer: BC Managed Care – PPO | Admitting: Certified Registered Nurse Anesthetist

## 2020-02-03 ENCOUNTER — Ambulatory Visit (HOSPITAL_COMMUNITY): Payer: BC Managed Care – PPO

## 2020-02-03 ENCOUNTER — Other Ambulatory Visit: Payer: Self-pay

## 2020-02-03 ENCOUNTER — Encounter (HOSPITAL_COMMUNITY): Payer: Self-pay | Admitting: Cardiology

## 2020-02-03 DIAGNOSIS — I2582 Chronic total occlusion of coronary artery: Secondary | ICD-10-CM | POA: Diagnosis not present

## 2020-02-03 DIAGNOSIS — K219 Gastro-esophageal reflux disease without esophagitis: Secondary | ICD-10-CM | POA: Diagnosis not present

## 2020-02-03 DIAGNOSIS — R591 Generalized enlarged lymph nodes: Secondary | ICD-10-CM | POA: Diagnosis not present

## 2020-02-03 DIAGNOSIS — I340 Nonrheumatic mitral (valve) insufficiency: Secondary | ICD-10-CM | POA: Diagnosis present

## 2020-02-03 DIAGNOSIS — Z7901 Long term (current) use of anticoagulants: Secondary | ICD-10-CM | POA: Insufficient documentation

## 2020-02-03 DIAGNOSIS — I4891 Unspecified atrial fibrillation: Secondary | ICD-10-CM | POA: Diagnosis not present

## 2020-02-03 DIAGNOSIS — Z79899 Other long term (current) drug therapy: Secondary | ICD-10-CM | POA: Diagnosis not present

## 2020-02-03 DIAGNOSIS — I10 Essential (primary) hypertension: Secondary | ICD-10-CM | POA: Diagnosis not present

## 2020-02-03 DIAGNOSIS — I2510 Atherosclerotic heart disease of native coronary artery without angina pectoris: Secondary | ICD-10-CM | POA: Diagnosis not present

## 2020-02-03 DIAGNOSIS — I081 Rheumatic disorders of both mitral and tricuspid valves: Secondary | ICD-10-CM | POA: Diagnosis not present

## 2020-02-03 HISTORY — PX: TEE WITHOUT CARDIOVERSION: SHX5443

## 2020-02-03 HISTORY — PX: RIGHT/LEFT HEART CATH AND CORONARY ANGIOGRAPHY: CATH118266

## 2020-02-03 LAB — CBC
HCT: 41.4 % (ref 39.0–52.0)
Hemoglobin: 14 g/dL (ref 13.0–17.0)
MCH: 30.2 pg (ref 26.0–34.0)
MCHC: 33.8 g/dL (ref 30.0–36.0)
MCV: 89.2 fL (ref 80.0–100.0)
Platelets: 269 10*3/uL (ref 150–400)
RBC: 4.64 MIL/uL (ref 4.22–5.81)
RDW: 12.6 % (ref 11.5–15.5)
WBC: 5.4 10*3/uL (ref 4.0–10.5)
nRBC: 0 % (ref 0.0–0.2)

## 2020-02-03 LAB — POCT I-STAT EG7
Acid-base deficit: 1 mmol/L (ref 0.0–2.0)
Bicarbonate: 24.5 mmol/L (ref 20.0–28.0)
Calcium, Ion: 1.24 mmol/L (ref 1.15–1.40)
HCT: 37 % — ABNORMAL LOW (ref 39.0–52.0)
Hemoglobin: 12.6 g/dL — ABNORMAL LOW (ref 13.0–17.0)
O2 Saturation: 74 %
Potassium: 4.1 mmol/L (ref 3.5–5.1)
Sodium: 141 mmol/L (ref 135–145)
TCO2: 26 mmol/L (ref 22–32)
pCO2, Ven: 44.9 mmHg (ref 44.0–60.0)
pH, Ven: 7.345 (ref 7.250–7.430)
pO2, Ven: 42 mmHg (ref 32.0–45.0)

## 2020-02-03 LAB — BASIC METABOLIC PANEL
Anion gap: 10 (ref 5–15)
BUN: 19 mg/dL (ref 8–23)
CO2: 25 mmol/L (ref 22–32)
Calcium: 9.1 mg/dL (ref 8.9–10.3)
Chloride: 105 mmol/L (ref 98–111)
Creatinine, Ser: 1.06 mg/dL (ref 0.61–1.24)
GFR calc Af Amer: >60 mL/min (ref >60)
GFR calc non Af Amer: >60 mL/min (ref >60)
Glucose, Bld: 109 mg/dL — ABNORMAL HIGH (ref 70–99)
Potassium: 4.5 mmol/L (ref 3.5–5.1)
Sodium: 140 mmol/L (ref 135–145)

## 2020-02-03 SURGERY — ECHOCARDIOGRAM, TRANSESOPHAGEAL
Anesthesia: Monitor Anesthesia Care

## 2020-02-03 SURGERY — RIGHT/LEFT HEART CATH AND CORONARY ANGIOGRAPHY
Anesthesia: LOCAL

## 2020-02-03 MED ORDER — MIDAZOLAM HCL 2 MG/2ML IJ SOLN
INTRAMUSCULAR | Status: AC
  Filled 2020-02-03: qty 2

## 2020-02-03 MED ORDER — SODIUM CHLORIDE 0.9% FLUSH: 3.0000 mL | INTRAVENOUS | Status: DC | PRN

## 2020-02-03 MED ORDER — HEPARIN SODIUM (PORCINE) 1000 UNIT/ML IJ SOLN
INTRAMUSCULAR | Status: AC
  Filled 2020-02-03: qty 1

## 2020-02-03 MED ORDER — FENTANYL CITRATE (PF) 100 MCG/2ML IJ SOLN
INTRAMUSCULAR | Status: DC | PRN
  Administered 2020-02-03: 25 ug via INTRAVENOUS

## 2020-02-03 MED ORDER — HEPARIN (PORCINE) IN NACL 1000-0.9 UT/500ML-% IV SOLN
INTRAVENOUS | Status: DC | PRN
  Administered 2020-02-03 (×2): 500 mL

## 2020-02-03 MED ORDER — SODIUM CHLORIDE 0.9 % WEIGHT BASED INFUSION
3.0000 mL/kg/h | INTRAVENOUS | Status: AC
  Administered 2020-02-03: 250 mL/kg/h via INTRAVENOUS
  Administered 2020-02-03: 3 mL/kg/h via INTRAVENOUS

## 2020-02-03 MED ORDER — SODIUM CHLORIDE 0.9 % IV SOLN: INTRAVENOUS | Status: DC

## 2020-02-03 MED ORDER — HEPARIN SODIUM (PORCINE) 1000 UNIT/ML IJ SOLN
INTRAMUSCULAR | Status: DC | PRN
  Administered 2020-02-03: 4500 [IU] via INTRAVENOUS

## 2020-02-03 MED ORDER — FENTANYL CITRATE (PF) 100 MCG/2ML IJ SOLN
INTRAMUSCULAR | Status: AC
  Filled 2020-02-03: qty 2

## 2020-02-03 MED ORDER — SODIUM CHLORIDE 0.9% FLUSH: 3.0000 mL | Freq: Two times a day (BID) | INTRAVENOUS | Status: DC

## 2020-02-03 MED ORDER — LIDOCAINE HCL (PF) 1 % IJ SOLN
INTRAMUSCULAR | Status: DC | PRN
  Administered 2020-02-03 (×2): 2 mL

## 2020-02-03 MED ORDER — PROPOFOL 500 MG/50ML IV EMUL
INTRAVENOUS | Status: DC | PRN
  Administered 2020-02-03: 150 ug/kg/min via INTRAVENOUS

## 2020-02-03 MED ORDER — IOHEXOL 350 MG/ML SOLN
INTRAVENOUS | Status: DC | PRN
  Administered 2020-02-03: 70 mL via INTRA_ARTERIAL

## 2020-02-03 MED ORDER — HEPARIN (PORCINE) IN NACL 1000-0.9 UT/500ML-% IV SOLN
INTRAVENOUS | Status: AC
  Filled 2020-02-03: qty 1000

## 2020-02-03 MED ORDER — VERAPAMIL HCL 2.5 MG/ML IV SOLN
INTRAVENOUS | Status: AC
  Filled 2020-02-03: qty 2

## 2020-02-03 MED ORDER — VERAPAMIL HCL 2.5 MG/ML IV SOLN
INTRAVENOUS | Status: DC | PRN
  Administered 2020-02-03: 10 mL via INTRA_ARTERIAL

## 2020-02-03 MED ORDER — SODIUM CHLORIDE 0.9 % IV SOLN: 250.0000 mL | INTRAVENOUS | Status: DC | PRN

## 2020-02-03 MED ORDER — SODIUM CHLORIDE 0.9 % WEIGHT BASED INFUSION
1.0000 mL/kg/h | INTRAVENOUS | Status: DC
  Administered 2020-02-03: 1 mL/kg/h via INTRAVENOUS

## 2020-02-03 MED ORDER — ASPIRIN 81 MG PO CHEW
81.0000 mg | CHEWABLE_TABLET | ORAL | Status: AC
  Administered 2020-02-03: 81 mg via ORAL

## 2020-02-03 MED ORDER — ASPIRIN 81 MG PO CHEW
CHEWABLE_TABLET | ORAL | Status: AC
  Filled 2020-02-03: qty 1

## 2020-02-03 MED ORDER — BUTAMBEN-TETRACAINE-BENZOCAINE 2-2-14 % EX AERO
INHALATION_SPRAY | CUTANEOUS | Status: DC | PRN
  Administered 2020-02-03: 2 via TOPICAL

## 2020-02-03 MED ORDER — LIDOCAINE HCL (PF) 1 % IJ SOLN
INTRAMUSCULAR | Status: AC
  Filled 2020-02-03: qty 30

## 2020-02-03 MED ORDER — MIDAZOLAM HCL 2 MG/2ML IJ SOLN
INTRAMUSCULAR | Status: DC | PRN
  Administered 2020-02-03: 1 mg via INTRAVENOUS

## 2020-02-03 SURGICAL SUPPLY — 12 items
CATH BALLN WEDGE 5F 110CM (CATHETERS) ×1 IMPLANT
CATH INFINITI 5 FR JL3.5 (CATHETERS) ×1 IMPLANT
CATH INFINITI JR4 5F (CATHETERS) ×1 IMPLANT
CATH OPTITORQUE TIG 4.0 5F (CATHETERS) ×1 IMPLANT
GLIDESHEATH SLEND A-KIT 6F 22G (SHEATH) ×1 IMPLANT
GUIDEWIRE INQWIRE 1.5J.035X260 (WIRE) IMPLANT
INQWIRE 1.5J .035X260CM (WIRE) ×2
KIT HEART LEFT (KITS) ×2 IMPLANT
PACK CARDIAC CATHETERIZATION (CUSTOM PROCEDURE TRAY) ×2 IMPLANT
SHEATH GLIDE SLENDER 4/5FR (SHEATH) ×1 IMPLANT
TRANSDUCER W/STOPCOCK (MISCELLANEOUS) ×2 IMPLANT
TUBING CIL FLEX 10 FLL-RA (TUBING) ×2 IMPLANT

## 2020-02-03 NOTE — Interval H&P Note (Signed)
History and Physical Interval Note:  02/03/2020 8:30 AM  Daniel Browning  has presented today for surgery, with the diagnosis of AFIB.  The various methods of treatment have been discussed with the patient and family. After consideration of risks, benefits and other options for treatment, the patient has consented to  Procedure(s): TRANSESOPHAGEAL ECHOCARDIOGRAM (TEE) (N/A) as a surgical intervention.  The patient's history has been reviewed, patient examined, no change in status, stable for surgery.  I have reviewed the patient's chart and labs.  Questions were answered to the patient's satisfaction.     [place]

## 2020-02-03 NOTE — H&P (Signed)
Daniel Browning is an 64 y.o. male.   Chief Complaint: Mitral regurgitation HPI:   64 y.o. Caucasian male with mild nonobstructive coronary artery disease (Cath 2003), severe primary MR,. New onset Afib.  Plan for TEE and RHC/LHC as pre-op workup.   Past Medical History:  Diagnosis Date  . Colon polyps   . GERD (gastroesophageal reflux disease)   . Heart murmur     Past Surgical History:  Procedure Laterality Date  . APPENDECTOMY  1982  . COLONOSCOPY    . HIP SURGERY Right 2008   8-9 screws- s/p 30 ft fall  . POLYPECTOMY    . radial head surgery Right 2008   from 30 ft. fall-   . UPPER GASTROINTESTINAL ENDOSCOPY      Family History  Problem Relation Age of Onset  . Lung cancer Mother 41  . Aneurysm Father 64  . Colon polyps Neg Hx   . Colon cancer Neg Hx   . Esophageal cancer Neg Hx   . Rectal cancer Neg Hx   . Stomach cancer Neg Hx    Social History:  reports that he has never smoked. He has never used smokeless tobacco. He reports current alcohol use. He reports that he does not use drugs.  Allergies: No Known Allergies  Review of Systems  Constitution: Negative for decreased appetite, malaise/fatigue, weight gain and weight loss.  HENT: Negative for congestion.   Eyes: Negative for visual disturbance.  Cardiovascular: Positive for chest pain and dyspnea on exertion. Negative for leg swelling, palpitations and syncope.  Respiratory: Negative for cough.   Endocrine: Negative for cold intolerance.  Hematologic/Lymphatic: Does not bruise/bleed easily.  Skin: Negative for itching and rash.  Musculoskeletal: Negative for myalgias.  Gastrointestinal: Negative for abdominal pain, nausea and vomiting.  Genitourinary: Negative for dysuria.  Neurological: Negative for dizziness and weakness.  Psychiatric/Behavioral: The patient is not nervous/anxious.   All other systems reviewed and are negative.    There were no vitals taken for this visit. There is no height or  weight on file to calculate BMI.  Physical Exam  Constitutional: He is oriented to person, place, and time. He appears well-developed and well-nourished. No distress.  HENT:  Head: Normocephalic and atraumatic.  Eyes: Pupils are equal, round, and reactive to light. Conjunctivae are normal.  Neck: No JVD present.  Cardiovascular: Normal rate, regular rhythm and intact distal pulses.  Murmur heard. High-pitched blowing holosystolic murmur is present with a grade of 3/6 at the apex. Pulmonary/Chest: Effort normal and breath sounds normal. He has no wheezes. He has no rales.  Abdominal: Soft. Bowel sounds are normal. There is no rebound.  Musculoskeletal:        General: No edema.  Lymphadenopathy:    He has no cervical adenopathy.  Neurological: He is alert and oriented to person, place, and time. No cranial nerve deficit.  Skin: Skin is warm and dry.  Psychiatric: He has a normal mood and affect.  Nursing note and vitals reviewed.   Labs:   Lab Results  Component Value Date   WBC 13.4 (H) 10/04/2017   HGB 11.2 (L) 10/04/2017   HCT 34.2 (L) 10/04/2017   MCV 81.0 10/04/2017   PLT 231 10/04/2017    Medications Prior to Admission  Medication Sig Dispense Refill  . nitroGLYCERIN (NITROSTAT) 0.4 MG SL tablet Place 1 tablet (0.4 mg total) under the tongue every 5 (five) minutes as needed for chest pain. 90 tablet 3  . omeprazole (PRILOSEC OTC) 20  MG tablet Take 20 mg by mouth daily.    . furosemide (LASIX) 20 MG tablet Take 1 tablet (20 mg total) by mouth daily. (Patient not taking: Reported on 01/27/2020) 90 tablet 3  . rivaroxaban (XARELTO) 20 MG TABS tablet Take 1 tablet (20 mg total) by mouth daily with supper. (Patient not taking: Reported on 01/27/2020) 60 tablet 2      Current Facility-Administered Medications:  .  0.9 %  sodium chloride infusion, , Intravenous, Continuous, Judah Chevere J, MD .  0.9 %  sodium chloride infusion, 250 mL, Intravenous, PRN, Shayle Donahoo,  Swayzee Wadley J, MD .  0.9% sodium chloride infusion, 3 mL/kg/hr, Intravenous, Continuous **FOLLOWED BY** 0.9% sodium chloride infusion, 1 mL/kg/hr, Intravenous, Continuous, Kalif Kattner J, MD .  Derrill Memo ON 02/04/2020] aspirin chewable tablet 81 mg, 81 mg, Oral, Pre-Cath, Leviathan Macera J, MD   Vitals pending    CARDIAC STUDIES:  Echocardiogram 01/14/2020:  Left ventricle cavity is normal in size. Mild concentric hypertrophy of  the left ventricle. Normal global wall motion. Normal LV systolic function  with EF 55%. Diastolic function not assessed due to severity of mitral  regurgitation.   Left atrial cavity is severely dilated.  Flail posterior leaflet with mild thickening. Severe, eccentric,  anteriorly directed mitral regurgitation.  Mild tricuspid regurgitation.  Estimated pulmonary artery systolic pressure is 29 mmHg.  EKG 01/07/2020: Atrial fibrillation, occasional PVC.   Assessment/Plan  64 y.o. Caucasian male with mild nonobstructive coronary artery disease (Cath 2003), severe primary MR,. New onset Afib.  Plan for TEE and RHC/LHC for pre-op workup.  Nigel Mormon, MD 02/03/2020, 8:24 AM [facility] Pager: [phone] Office: [phone] If no answer: [phone]

## 2020-02-03 NOTE — Anesthesia Postprocedure Evaluation (Signed)
Anesthesia Post Note  Patient: Daniel Browning  Procedure(s) Performed: TRANSESOPHAGEAL ECHOCARDIOGRAM (TEE) (N/A )     Patient location during evaluation: PACU Anesthesia Type: MAC Level of consciousness: awake and alert Pain management: pain level controlled Vital Signs Assessment: post-procedure vital signs reviewed and stable Respiratory status: spontaneous breathing, nonlabored ventilation, respiratory function stable and patient connected to nasal cannula oxygen Cardiovascular status: stable and blood pressure returned to baseline Postop Assessment: no apparent nausea or vomiting Anesthetic complications: no    Last Vitals:  Vitals:   02/03/20 0955 02/03/20 1010  BP: 94/70 118/78  Pulse: 89 99  Resp: (!) 31 14  Temp:    SpO2: 97% 98%    Last Pain:  Vitals:   02/03/20 0943  TempSrc: Axillary                 [name]

## 2020-02-03 NOTE — Anesthesia Preprocedure Evaluation (Addendum)
Anesthesia Evaluation  Patient identified by MRN, date of birth, ID band Patient awake    Reviewed: Allergy & Precautions, NPO status , Patient's Chart, lab work & pertinent test results  Airway Mallampati: II  TM Distance: >3 FB Neck ROM: Full    Dental no notable dental hx. (+) Teeth Intact, Implants   Pulmonary neg pulmonary ROS,    Pulmonary exam normal breath sounds clear to auscultation       Cardiovascular hypertension, Normal cardiovascular exam+ Valvular Problems/Murmurs MR  Rhythm:Regular Rate:Normal     Neuro/Psych negative neurological ROS  negative psych ROS   GI/Hepatic Neg liver ROS, GERD  ,  Endo/Other  negative endocrine ROS  Renal/GU negative Renal ROS     Musculoskeletal negative musculoskeletal ROS (+)   Abdominal   Peds  Hematology negative hematology ROS (+)   Anesthesia Other Findings   Reproductive/Obstetrics                            Anesthesia Physical Anesthesia Plan  ASA: III  Anesthesia Plan: MAC   Post-op Pain Management:    Induction: Intravenous  PONV Risk Score and Plan: Treatment may vary due to age or medical condition  Airway Management Planned: Nasal Cannula and Natural Airway  Additional Equipment:   Intra-op Plan:   Post-operative Plan:   Informed Consent: I have reviewed the patients History and Physical, chart, labs and discussed the procedure including the risks, benefits and alternatives for the proposed anesthesia with the patient or authorized representative who has indicated his/her understanding and acceptance.     Dental advisory given  Plan Discussed with:   Anesthesia Plan Comments:         Anesthesia Quick Evaluation

## 2020-02-03 NOTE — CV Procedure (Signed)
TEE: Under deep sedation administered and monitored by anesthesiology, TEE was performed without complications: LV: Normal. Normal EF. RV: Normal LA: Normal. Left atrial appendage: Normal without thrombus. Normal function. Inter atrial septum is intact without defect. RA: Normal  MV: Flail P2 with severe anteriorly directed MR. TV: Normal Trace TR AV: Normal. No AI or AS. PV: Normal. No PI.  Thoracic and ascending aorta: Normal without significant plaque or atheromatous changes.  Deep sedation administered and monitored by anesthesiology, Patient tolerated the procedure well and there was no complication from deep sedation.   Nigel Mormon, MD [facility] Pager: [phone] Office: [phone]

## 2020-02-03 NOTE — Transfer of Care (Signed)
Immediate Anesthesia Transfer of Care Note  Patient: Daniel Browning  Procedure(s) Performed: TRANSESOPHAGEAL ECHOCARDIOGRAM (TEE) (N/A )  Patient Location: PACU and Endoscopy Unit  Anesthesia Type:MAC  Level of Consciousness: responds to stimulation  Airway & Oxygen Therapy: Patient Spontanous Breathing and Patient connected to nasal cannula oxygen  Post-op Assessment: Report given to RN and Post -op Vital signs reviewed and stable  Post vital signs: Reviewed and stable  Last Vitals:  Vitals Value Taken Time  BP 95/52 02/03/20 0943  Temp 36.6 C 02/03/20 0943  Pulse 93 02/03/20 0948  Resp 21 02/03/20 0948  SpO2 96 % 02/03/20 0948  Vitals shown include unvalidated device data.  Last Pain:  Vitals:   02/03/20 0943  TempSrc: Axillary         Complications: No apparent anesthesia complications

## 2020-02-03 NOTE — Discharge Instructions (Signed)
DRINK PLENTY OF FLUIDS FOR THE NEXT 2-3 DAYS.  KEEP ARM ELEVATED THE REMAINDER OF THE DAY.  Radial Site Care  This sheet gives you information about how to care for yourself after your procedure. Your health care provider may also give you more specific instructions. If you have problems or questions, contact your health care provider. What can I expect after the procedure? After the procedure, it is common to have:  Bruising and tenderness at the catheter insertion area. Follow these instructions at home: Medicines  Take over-the-counter and prescription medicines only as told by your health care provider. Insertion site care 1. Follow instructions from your health care provider about how to take care of your insertion site. Make sure you: ? Wash your hands with soap and water before you change your bandage (dressing). If soap and water are not available, use hand sanitizer. ? Change your dressing as told by your health care provider. 2. Check your insertion site every day for signs of infection. Check for: ? Redness, swelling, or pain. ? Fluid or blood. ? Pus or a bad smell. ? Warmth. 3. Do not take baths, swim, or use a hot tub for 5 days. 4. You may shower 24-48 hours after the procedure. ? Remove the dressing and gently wash the site with plain soap and water. ? Pat the area dry with a clean towel. ? Do not rub the site. That could cause bleeding. 5. Do not apply powder or lotion to the site. Activity  1. For 24 hours after the procedure, or as directed by your health care provider: ? Do not flex or bend the affected arm. ? Do not push or pull heavy objects with the affected arm. ? Do not drive yourself home from the hospital or clinic. You may drive 24 hours after the procedure. ? Do not operate machinery or power tools. 2. Do not push, pull or lift anything that is heavier than 10 lb for 5 days. 3. Ask your health care provider when it is okay to: ? Return to work or  school. ? Resume usual physical activities or sports. ? Resume sexual activity. General instructions  If the catheter site starts to bleed, raise your arm and put firm pressure on the site. If the bleeding does not stop, get help right away. This is a medical emergency.  If you went home on the same day as your procedure, a responsible adult should be with you for the first 24 hours after you arrive home.  Keep all follow-up visits as told by your health care provider. This is important. Contact a health care provider if:  You have a fever.  You have redness, swelling, or yellow drainage around your insertion site. Get help right away if:  You have unusual pain at the radial site.  The catheter insertion area swells very fast.  The insertion area is bleeding, and the bleeding does not stop when you hold steady pressure on the area.  Your arm or hand becomes pale, cool, tingly, or numb. These symptoms may represent a serious problem that is an emergency. Do not wait to see if the symptoms will go away. Get medical help right away. Call your local emergency services (911 in the [place] Do not drive yourself to the hospital. Summary  After the procedure, it is common to have bruising and tenderness at the site.  Follow instructions from your health care provider about how to take care of your radial site wound. Check   the wound every day for signs of infection.  Do not push, pull or lift anything that is heavier than 10 lb for 5 days.  This information is not intended to replace advice given to you by your health care provider. Make sure you discuss any questions you have with your health care provider. Document Revised: 01/02/2018 Document Reviewed: 01/02/2018 Elsevier Patient Education  2020 [employer] 

## 2020-02-03 NOTE — Progress Notes (Signed)
  Echocardiogram Echocardiogram Transesophageal has been performed.  Daniel Browning 02/03/2020, 10:09 AM

## 2020-02-03 NOTE — Interval H&P Note (Signed)
History and Physical Interval Note:  02/03/2020 10:39 AM  Daniel Browning  has presented today for surgery, with the diagnosis of Chest pain.  The various methods of treatment have been discussed with the patient and family. After consideration of risks, benefits and other options for treatment, the patient has consented to  Procedure(s): RIGHT/LEFT HEART CATH AND CORONARY ANGIOGRAPHY (N/A) as a surgical intervention.  The patient's history has been reviewed, patient examined, no change in status, stable for surgery.  I have reviewed the patient's chart and labs.  Questions were answered to the patient's satisfaction.    2012 Appropriate Use Criteria for Diagnostic Catheterization Home / Select Test of Interest Indication for RHC Valvular Disease Valvular Disease (Right and Left Heart Catheterization or Right Heart Catheterization Alone With or  Valvular Disease  (Right and Left Heart Catheterization or Right Heart Catheterization  Alone With or Without Left Ventriculography and Coronary Angiography) Link Here: [employer]impTSh[employer]fi Indication:  Preoperative assessment before valvular surgery A (7) Indication: 70; Score 7    Jayde Daffin J Caylan Schifano

## 2020-02-05 ENCOUNTER — Ambulatory Visit (HOSPITAL_COMMUNITY): Payer: Self-pay

## 2020-02-05 ENCOUNTER — Encounter: Payer: Self-pay | Admitting: Thoracic Surgery (Cardiothoracic Vascular Surgery)

## 2020-02-05 ENCOUNTER — Other Ambulatory Visit: Payer: Self-pay

## 2020-02-05 ENCOUNTER — Institutional Professional Consult (permissible substitution) (INDEPENDENT_AMBULATORY_CARE_PROVIDER_SITE_OTHER): Payer: BC Managed Care – PPO | Admitting: Thoracic Surgery (Cardiothoracic Vascular Surgery)

## 2020-02-05 VITALS — BP 133/86 | HR 60 | Temp 97.7°F | Resp 20 | Ht 70.0 in | Wt 188.0 lb

## 2020-02-05 DIAGNOSIS — I25119 Atherosclerotic heart disease of native coronary artery with unspecified angina pectoris: Secondary | ICD-10-CM | POA: Diagnosis not present

## 2020-02-05 DIAGNOSIS — I4819 Other persistent atrial fibrillation: Secondary | ICD-10-CM | POA: Diagnosis not present

## 2020-02-05 DIAGNOSIS — I340 Nonrheumatic mitral (valve) insufficiency: Secondary | ICD-10-CM | POA: Diagnosis not present

## 2020-02-05 DIAGNOSIS — I208 Other forms of angina pectoris: Secondary | ICD-10-CM

## 2020-02-05 DIAGNOSIS — I2510 Atherosclerotic heart disease of native coronary artery without angina pectoris: Secondary | ICD-10-CM | POA: Insufficient documentation

## 2020-02-05 NOTE — Patient Instructions (Addendum)
Stop taking Xarelto after you take your dose on March 4   Continue taking all other medications without change through the day before surgery.  Make sure to bring all of your medications with you when you come for your Pre-Admission Testing appointment at [facility] Short-Stay Department.  Have nothing to eat or drink after midnight the night before surgery.  On the morning of surgery take only Prilosec with a sip of water.  At your appointment for Pre-Admission Testing at the [facility] Short-Stay Department you will be asked to sign permission forms for your upcoming surgery.  By definition your signature on these forms implies that you and/or your designee provide full informed consent for your planned surgical procedure(s), that alternative treatment options have been discussed, that you understand and accept any and all potential risks, and that you have some understanding of what to expect for your post-operative convalescence.  For any major cardiac surgical procedure potential operative risks include but are not limited to at least some risk of death, stroke or other neurologic complication, myocardial infarction, congestive heart failure, respiratory failure, renal failure, bleeding requiring blood transfusion and/or reexploration, irregular heart rhythm, heart block or bradycardia requiring permanent pacemaker, pneumonia, pericardial effusion, pleural effusion, wound infection, pulmonary embolus or other thromboembolic complication, chronic pain, or other complications related to the specific procedure(s) performed.  Please call to schedule a follow-up appointment in our office prior to surgery if you have any unresolved questions about your planned surgical procedure, the associated risks, alternative treatment options, and/or expectations for your post-operative recovery.

## 2020-02-05 NOTE — Progress Notes (Signed)
[address]       [place] [address]             [phone]     CARDIOTHORACIC SURGERY CONSULTATION REPORT  Referring Provider is Patwardhan, Reynold Bowen, MD PCP is [name], MD  Chief Complaint  Patient presents with  . Mitral Regurgitation    Surgical eval, Cardiac Cath and TEE 02/03/20    HPI:  Patient is a 64 yo male with history of GERD, mitral valve prolapse with life-long heart murmur and CAD referred for surgical consultation to discuss treatment options for management of recently diagnosed severe, symptomatic primary mitral regurgitation, single-vessel coronary artery disease with stable angina pectoris and presumably recent onset persistent atrial fibrillation.  Patient states that he has been told that he had a heart murmur since his teenage years.  He was first evaluated by a cardiologist in 2003 when he had an abnormal stress test performed for evaluation of chest pain.  Catheterization performed at that time reportedly revealed non-obstructive coronary artery disease.  Patient states that he also had an echocardiogram performed at that time that revealed mitral valve prolapse which he was told was not worrisome but needed to be watched intermittently.  Patient states that approximately 1 year ago he began to experience intermittent chest discomfort and SOB with exertion.  He initially attributed these symptoms as reflux, but they persisted and gradually got worse.  He denies any prolonged episodes of chest pain and symptoms have always been associated with exertion and relieved by rest.  He denies PND, orthopnea, or lower extremity edema.  He also denies any symptoms of palpitations, dizzy spells or syncope.  Patient eventually presented to his primary care physician last November who discovered a new prominent systolic murmur on examination.  He was referred for cardiology consult and evaluated by Dr. Earnie Larsson on January 07, 2020.  He was found  to be in atrial fibrillation at the time and started on oral Xarelto for anticoagulation.  Transthoracic echocardiogram revealed mitral valve prolapse with severe mitral regurgitation and low normal LV systolic function with ejection fraction reported 55% in the setting of severe mitral regurgitation.  The patient was evaluated by Dr. Gerrit Friends at [facility] for possible mitral valve repair who recommended diagnostic cardiac catheterization.  TEE and diagnostic catheterization were performed February 03, 2020.  TEE confirmed the presence of mitral valve prolapse with an obvious flail segment of the posterior leaflet and severe mitral regurgitation.  There was severe left atrial enlargement.  Left ventricular function was reported normal with ejection fraction 55-60%.  Diagnostic cardiac catheterization revealed severe coronary artery disease with chronic occlusion of the mid left anterior descending coronary artery with mild non-obstructive disease in the left circumflex and right coronary arteries.  There was mild pulmonary hypertension and large V waves on wedge tracing consistent with severe mitral regurgitation.  The patient was referred for a second surgical consultation.  Patient is married and lives locally in [place] with his wife and an adult daughter who is disabled.  He has 2 other adult daughters who both live and work in [place].  He works full-time at [employer] in [place].  He previously owned and operated a Tree surgeon.  The patient does not exercise on a regular basis but he reports no significant physical limitations.  He enjoys playing golf although he has not been playing as much recently.  He spends a lot of time working around the house when  he is not at work.   Past Medical History:  Diagnosis Date  . Colon polyps   . GERD (gastroesophageal reflux disease)   . Heart murmur     Past Surgical History:  Procedure Laterality Date  . APPENDECTOMY  1982  . COLONOSCOPY    . HIP  SURGERY Right 2008   8-9 screws- s/p 30 ft fall  . POLYPECTOMY    . radial head surgery Right 2008   from 30 ft. fall-   . RIGHT/LEFT HEART CATH AND CORONARY ANGIOGRAPHY N/A 02/03/2020   Procedure: RIGHT/LEFT HEART CATH AND CORONARY ANGIOGRAPHY;  Surgeon: Nigel Mormon, MD;  Location: [facility];  Service: Cardiovascular;  Laterality: N/A;  . UPPER GASTROINTESTINAL ENDOSCOPY      Family History  Problem Relation Age of Onset  . Lung cancer Mother 49  . Aneurysm Father 69  . Colon polyps Neg Hx   . Colon cancer Neg Hx   . Esophageal cancer Neg Hx   . Rectal cancer Neg Hx   . Stomach cancer Neg Hx     Social History   Socioeconomic History  . Marital status: Married    Spouse name: Not on file  . Number of children: 3  . Years of education: Not on file  . Highest education level: Not on file  Occupational History  . Not on file  Tobacco Use  . Smoking status: Never Smoker  . Smokeless tobacco: Never Used  Substance and Sexual Activity  . Alcohol use: Yes    Comment: occasional- 2-3 beers a month   . Drug use: No  . Sexual activity: Not on file  Other Topics Concern  . Not on file  Social History Narrative  . Not on file   Social Determinants of Health   Financial Resource Strain:   . Difficulty of Paying Living Expenses: Not on file  Food Insecurity:   . Worried About Charity fundraiser in the Last Year: Not on file  . Ran Out of Food in the Last Year: Not on file  Transportation Needs:   . Lack of Transportation (Medical): Not on file  . Lack of Transportation (Non-Medical): Not on file  Physical Activity:   . Days of Exercise per Week: Not on file  . Minutes of Exercise per Session: Not on file  Stress:   . Feeling of Stress : Not on file  Social Connections:   . Frequency of Communication with Friends and Family: Not on file  . Frequency of Social Gatherings with Friends and Family: Not on file  . Attends Religious Services: Not on file    . Active Member of Clubs or Organizations: Not on file  . Attends Archivist Meetings: Not on file  . Marital Status: Not on file  Intimate Partner Violence:   . Fear of Current or Ex-Partner: Not on file  . Emotionally Abused: Not on file  . Physically Abused: Not on file  . Sexually Abused: Not on file    Current Outpatient Medications  Medication Sig Dispense Refill  . furosemide (LASIX) 20 MG tablet Take 1 tablet (20 mg total) by mouth daily. 90 tablet 3  . nitroGLYCERIN (NITROSTAT) 0.4 MG SL tablet Place 1 tablet (0.4 mg total) under the tongue every 5 (five) minutes as needed for chest pain. 90 tablet 3  . omeprazole (PRILOSEC OTC) 20 MG tablet Take 20 mg by mouth daily.    . rivaroxaban (XARELTO) 20 MG TABS tablet  Take 1 tablet (20 mg total) by mouth daily with supper. 60 tablet 2   No current facility-administered medications for this visit.    No Known Allergies    Review of Systems:   General:  normal appetite, decreased energy, no weight gain, no weight loss, no fever  Cardiac:  + chest pain with exertion, no chest pain at rest, +SOB with exertion, no resting SOB, no PND, no orthopnea, no palpitations, + arrhythmia, + atrial fibrillation, no LE edema, no dizzy spells, no syncope  Respiratory:  + exertion shortness of breath, no home oxygen, + productive cough, + dry cough, no bronchitis, no wheezing, no hemoptysis, no asthma, no pain with inspiration or cough, no sleep apnea, no CPAP at night  GI:   no difficulty swallowing, + reflux, no frequent heartburn, no hiatal hernia, no abdominal pain, no constipation, no diarrhea, no hematochezia, no hematemesis, no melena  GU:   no dysuria,  no frequency, no urinary tract infection, no hematuria, no enlarged prostate, no kidney stones, no kidney disease  Vascular:  no pain suggestive of claudication, no pain in feet, no leg cramps, no varicose veins, no DVT, no non-healing foot ulcer  Neuro:   no stroke, no TIA's, no  seizures, no headaches, no temporary blindness one eye,  no slurred speech, no peripheral neuropathy, no chronic pain, no instability of gait, no memory/cognitive dysfunction  Musculoskeletal: no arthritis, no joint swelling, no myalgias, no difficulty walking, normal mobility   Skin:   no rash, no itching, no skin infections, no pressure sores or ulcerations  Psych:   no anxiety, no depression, no nervousness, no unusual recent stress  Eyes:   no blurry vision, no floaters, no recent vision changes, + wears glasses for reading only  ENT:   no hearing loss, no loose or painful teeth, no dentures, last saw dentist 1 year ago  Hematologic:  no easy bruising, no abnormal bleeding, no clotting disorder, no frequent epistaxis  Endocrine:  no diabetes, does not check CBG's at home     Physical Exam:   BP 133/86 (BP Location: Right Arm, Patient Position: Sitting, Cuff Size: Normal)   Pulse 60   Temp 97.7 F (36.5 C) (Oral)   Resp 20   Ht 5\' 10"  (1.778 m)   Wt 188 lb (85.3 kg)   SpO2 97% Comment: RA  BMI 26.98 kg/m   General:   well-appearing  HEENT:  Unremarkable   Neck:   no JVD, no bruits, no adenopathy   Chest:   clear to auscultation, symmetrical breath sounds, no wheezes, no rhonchi   CV:   Irregular rate and rhythm w/ prominent holosystolic murmur   Abdomen:  soft, non-tender, no masses   Extremities:  warm, well-perfused, pulses palpable, no LE edema  Rectal/GU  Deferred  Neuro:   Grossly non-focal and symmetrical throughout  Skin:   Clean and dry, no rashes, no breakdown   Diagnostic Tests:   Echocardiogram 01/14/2020:  Left ventricle cavity is normal in size. Mild concentric hypertrophy of  the left ventricle. Normal global wall motion. Normal LV systolic function  with EF 55%. Diastolic function not assessed due to severity of mitral  regurgitation.   Left atrial cavity is severely dilated.  Flail posterior leaflet with mild thickening. Severe, eccentric,  anteriorly  directed mitral regurgitation.  Mild tricuspid regurgitation.  Estimated pulmonary artery systolic pressure is 29 mmHg.      TRANSESOPHOGEAL ECHO REPORT       Patient Name:  Oletta Darter Date of Exam: 02/03/2020  Medical Rec #: [id number]   Height:    70.0 in  Accession #:  [id number]  Weight:    195.0 lb  Date of Birth: [birth year]   BSA:     2.065 m  Patient Age:  77 years   BP:      129/81 mmHg  Patient Gender: M       HR:      104 bpm.  Exam Location: Outpatient   Procedure: Transesophageal Echo   Indications:   mitral regurgitation    History:     Patient has no prior history of Echocardiogram  examinations.          Arrythmias:Atrial Fibrillation.    Sonographer:   Johny Chess RDCS  Referring Phys: [id number] [facility] J PATWARDHAN  Diagnosing Phys: Vernell Leep MD   PROCEDURE: After discussion of the risks and benefits of a TEE, an  informed consent was obtained from the patient. The transesophogeal probe  was passed without difficulty through the esophogus of the patient. Local  oropharyngeal anesthetic was provided  with Cetacaine. Sedation performed by different physician. Image quality  was good. The patient's vital signs; including heart rate, blood pressure,  and oxygen saturation; remained stable throughout the procedure. The  patient developed no complications  during the procedure. Deep sedation administered and monitored by  anesthesiology.   IMPRESSIONS    1. Left ventricular ejection fraction, by estimation, is 55 to 60%. The  left ventricle has normal function. The left ventricle has no regional  wall motion abnormalities. Left ventricular diastolic function could not  be evaluated.  2. Right ventricular systolic function is normal. The right ventricular  size is normal.  3. Left atrial size was severely dilated. No left atrial/left atrial  appendage thrombus was detected.   4. 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  5. No other significant valvular abnormality.   FINDINGS  Left Ventricle: Left ventricular ejection fraction, by estimation, is 55  to 60%. The left ventricle has normal function. The left ventricle has no  regional wall motion abnormalities. The left ventricular internal cavity  size was normal in size. There is  no left ventricular hypertrophy.   Right Ventricle: The right ventricular size is normal. No increase in  right ventricular wall thickness. Right ventricular systolic function is  normal.   Left Atrium: Left atrial size was severely dilated. No left atrial/left  atrial appendage thrombus was detected.   Right Atrium: Right atrial size was normal in size.   Pericardium: There is no evidence of pericardial effusion.   Mitral Valve: The mitral valve is myxomatous. There is severe prolapse of  the middle scallop of the posterior leaflet of the mitral valve. Severe  mitral valve regurgitation. MV peak gradient, 9.2 mmHg. The mean mitral  valve gradient is 4.0 mmHg.   Tricuspid Valve: The tricuspid valve is grossly normal. Tricuspid valve  regurgitation is trivial.   Aortic Valve: The aortic valve is tricuspid. Aortic valve regurgitation is  not visualized.   Pulmonic Valve: The pulmonic valve was normal in structure. Pulmonic valve  regurgitation is not visualized.   Aorta: The aortic root is normal in size and structure.   IAS/Shunts: No atrial level shunt detected by color flow Doppler.     MITRAL VALVE  MV Peak grad: 9.2 mmHg  MV Mean grad: 4.0 mmHg  MV Vmax:   1.52 m/s  MV Vmean:   90.4  cm/s  MR Peak grad: 105.5 mmHg  MR Mean grad: 57.0 mmHg  MR Vmax:   513.50 cm/s  MR Vmean:   346.5 cm/s   Manish Patwardhan MD  Electronically signed by Vernell Leep MD  Signature Date/Time: 02/03/2020/5:28:05 PM    RIGHT/LEFT HEART CATH AND CORONARY  ANGIOGRAPHY  Conclusion  LM: Normal LAD: Mid LAD CTO with faint collaterals from Diag and RCA LCx: Prox OM1 focal 40% stenosis RCA: Normal  RA: 6 mmHg RV: 31/2 mmHg PA: 36/14 mmHg, mean PAP 23 mmHg PCW: Mean 16 mmHg with tall V wave LVEDP 7 mmHg CO: 5.4 L/min CI: 2.6 L/min.m2  Impression: Single vessel obstructive CAD (LAD CTO) Nonobstructive prox OM1 disease Tall V waves s/o severe mitral regurgitation  Nigel Mormon, MD [facility] [place] Pager: [phone] Office: [phone]    Recommendations  Antiplatelet/Anticoag On Aspirin and rivaroxaban for CAD and Afib  Surgeon Notes    02/03/2020 9:41 AM CV Procedure signed by Nigel Mormon, MD  Indications  Nonrheumatic mitral valve regurgitation [I34.0 (ICD-10-CM)]  Procedural Details  Technical Details Procedures: 1. Right heart catheterization 2. Left heart catheterization 3. Selective right and left coronary angiography 4. Conscious sedation monitoring 36 min  Indication: Mitral regurgitation  History: 64 y.o.Caucasianmalewith mild nonobstructive coronary artery disease(Cath 2003),severe primary MR,. New onset Afib.  Diagnostic Angiography: 5 Fr TIG  Pressures tracings obtained in right atrium, right ventricle, pulmonary artery, and pulmonary capillary wedge position.   Anticoagulation:  4500 units heparin  Hemostasis: TR band  Total contrast used: 70 cc   Total fluoro time: 5.4 min Air Kerma: 699 mGy  All wires and catheters removed out of the body at the end of the procedure Final angiogram showed no dissection/perforation         Estimated blood loss <50 mL.   During this procedure medications were administered to achieve and maintain moderate conscious sedation while the patient's heart rate, blood pressure, and oxygen saturation were continuously monitored and I was present face-to-face 100% of this time.     RIGHT/LEFT HEART CATH AND CORONARY  ANGIOGRAPHY  None Documented by Nigel Mormon, MD 02/03/2020 6:23 PM  Date Found: 02/03/2020  Time Range: Intraprocedure      Coronary Findings  Diagnostic Dominance: Co-dominant Left Anterior Descending  Collaterals  Dist LAD filled by collaterals from 1st Diag.    Collaterals  Dist LAD filled by collaterals from RPDA.    Mid LAD to Dist LAD lesion 100% stenosed  Mid LAD to Dist LAD lesion is 100% stenosed.  Left Circumflex  First Obtuse Marginal Branch  1st Mrg lesion 40% stenosed  1st Mrg lesion is 40% stenosed.  Right Coronary Artery  Prox RCA lesion 0% stenosed  Non-stenotic Prox RCA lesion.  Intervention  No interventions have been documented. Right Heart  Right Heart Pressures RA: 6 mmHg RV: 31/2 mmHg PA: 36/14 mmHg, mean PAP 23 mmHg PCW: Mean 16 mmHg with tall V wave  Left Heart  Left Ventricle LVEDP 7 mmHg  CO: 5.4 L/min CI: 2.6 L/min.m2  Coronary Diagrams  Diagnostic Dominance: Co-dominant  Intervention  Implants   No implant documentation for this case.  Syngo Images  Show images for CARDIAC CATHETERIZATION  Images on Long Term Storage  Show images for Callahan, Mondry to Procedure Log  Procedure Log    Hemo Data   Most Recent Value  Fick Cardiac Output 5.48 L/min  Fick Cardiac Output Index 2.69 (L/min)/BSA  RA A Wave  7 mmHg  RA V Wave 7 mmHg  RA Mean 6 mmHg  RV Systolic Pressure 31 mmHg  RV Diastolic Pressure 2 mmHg  RV EDP 6 mmHg  PA Systolic Pressure 36 mmHg  PA Diastolic Pressure 14 mmHg  PA Mean 23 mmHg  PW A Wave 14 mmHg  PW V Wave 26 mmHg  PW Mean 16 mmHg  AO Systolic Pressure 0000000 mmHg  AO Diastolic Pressure 82 mmHg  AO Mean 96 mmHg  LV Systolic Pressure 93 mmHg  LV Diastolic Pressure 4 mmHg  LV EDP 7 mmHg  AOp Systolic Pressure [id number] mmHg  AOp Diastolic Pressure 71 mmHg  AOp Mean Pressure 86 mmHg  LVp Systolic Pressure 123XX123 mmHg  LVp Diastolic Pressure 7 mmHg  LVp EDP Pressure 9 mmHg  QP/QS 1    TPVR Index 8.56 HRUI  TSVR Index 35.75 HRUI  PVR SVR Ratio 0.08  TPVR/TSVR Ratio 0.24     Impression:  Patient has mitral valve prolapse with stage D severe symptomatic primary mitral regurgitation, severe vessel coronary artery disease with stable exertional angina, and recent onset persistent atrial fibrillation.  He describes stable symptoms of exertional chest discomfort and shortness of breath that have been slowly progressing over the past year consistent with classical angina pectoris and chronic diastolic congestive heart failure, functional class II.    I have personally reviewed the patient's recent transesophageal echocardiogram and diagnostic cardiac catheterization.  TEE reveals myxomatous degenerative disease of the mitral valve with an obvious flail segment involving a portion of the posterior leaflet with ruptured primary chordae tendinae and severe mitral regurgitation.  The jet of regurgitation is eccentric.  The left atrium is dilated.  There is normal left ventricular size and systolic function.  Catheterization reveals multivessel coronary artery disease with 100% chronic occlusion of the mid left anterior descending coronary artery just after a medium size diagonal branch.  There are faint collaterals filling the terminal portion of the left anterior descending coronary artery from both the diagonal branch and the right coronary circulation.  The distal portion of the left anterior descending coronary artery is not well-visualized and may be diffusely diseased.  There is nonobstructive disease in the left circumflex and right coronary territories.  There was mild pulmonary hypertension with large V waves on pulmonary capillary wedge tracing.  I agree the patient needs mitral valve repair.  Although the patient's terminal portion of the left anterior descending coronary artery may be diffusely diseased, I would favor an attempt at coronary artery bypass grafting at the time of  surgery.  Finally, the patient may benefit from concomitant Maze procedure.   Plan:  The patient and his wife were counseled at length regarding the indications, risks and potential benefits of mitral valve repair and coronary artery bypass grafting.  The rationale for elective surgery has been explained, including a comparison between surgery and continued medical therapy with close follow-up.  The likelihood of successful and durable mitral valve repair has been discussed with particular reference to the findings of their recent echocardiogram.  Based upon these findings and previous experience, I have quoted them a greater than 95 percent likelihood of successful valve repair with less than 1 percent risk of mortality or major morbidity.  Alternative surgical approaches have been discussed including a comparison between conventional sternotomy and minimally-invasive techniques.  The rationale for coronary artery bypass grafting and the associated need to proceed with surgery via conventional median sternotomy was discussed.  The relative risks and benefits of  performing a maze procedure at the time of his surgery was discussed at length, including the expected likelihood of long term freedom from recurrent symptomatic atrial fibrillation and/or atrial flutter.  Expectations for the patient's postoperative convalescence has been discussed.  The patient desires to proceed with surgery in the near future.    We tentatively plan to proceed with mitral valve repair, coronary artery bypass grafting, and Maze procedure on Thursday, February 19, 2020.  The patient has been instructed to stop taking Xarelto 1 week prior to surgery.  He will return to our office for follow-up prior to surgery on Monday, February 16, 2020.  All questions answered.    I spent in excess of 90 minutes during the conduct of this office consultation and >50% of this time involved direct face-to-face encounter with the patient for counseling  and/or coordination of their care.    Valentina Gu. Roxy Manns, MD 02/05/2020 4:18 PM

## 2020-02-06 ENCOUNTER — Encounter: Payer: Self-pay | Admitting: *Deleted

## 2020-02-06 ENCOUNTER — Other Ambulatory Visit: Payer: Self-pay | Admitting: *Deleted

## 2020-02-06 DIAGNOSIS — I2510 Atherosclerotic heart disease of native coronary artery without angina pectoris: Secondary | ICD-10-CM

## 2020-02-06 DIAGNOSIS — I340 Nonrheumatic mitral (valve) insufficiency: Secondary | ICD-10-CM

## 2020-02-13 NOTE — Pre-Procedure Instructions (Signed)
[id number], [place] [id number] [place] [phone] Phone: [phone] Fax: [phone]     Your procedure is scheduled on Thursday March 11th.  Report to [facility] Main Entrance "A" at 5:30 A.M., and check in at the Admitting office.  Call this number if you have problems the morning of surgery:  [phone]  Call [phone] if you have any questions prior to your surgery date Monday-Friday 8am-4pm    Remember:  Do not eat or drink after midnight the night before your surgery   Take these medicines the morning of surgery with A SIP OF WATER  omeprazole (PRILOSEC OTC)   Follow your surgeon's instructions on when to stop rivaroxaban (XARELTO).  If no instructions were given by your surgeon then you will need to call the office to get those instructions.     7 days prior to surgery STOP taking any Aspirin (unless otherwise instructed by your surgeon), Aleve, Naproxen, Ibuprofen, Motrin, Advil, Goody's, BC's, all herbal medications, fish oil, and all vitamins.    The Morning of Surgery  Do not wear jewelry, make-up or nail polish.  Do not wear lotions, powders, or perfumes/colognes, or deodorant  Do not shave 48 hours prior to surgery.  Men may shave face and neck.  Do not bring valuables to the hospital.  [facility] is not responsible for any belongings or valuables.  If you are a smoker, DO NOT Smoke 24 hours prior to surgery  If you wear a CPAP at night please bring your mask the morning of surgery   Remember that you must have someone to transport you home after your surgery, and remain with you for 24 hours if you are discharged the same day.   Please bring cases for contacts, glasses, hearing aids, dentures or bridgework because it cannot be worn into surgery.    Leave your suitcase in the car.  After surgery it may be brought to your room.  For patients admitted to the  hospital, discharge time will be determined by your treatment team.  Patients discharged the day of surgery will not be allowed to drive home.    Special instructions:   [facility]- Preparing For Surgery  Before surgery, you can play an important role. Because skin is not sterile, your skin needs to be as free of germs as possible. You can reduce the number of germs on your skin by washing with CHG (chlorahexidine gluconate) Soap before surgery.  CHG is an antiseptic cleaner which kills germs and bonds with the skin to continue killing germs even after washing.    Oral Hygiene is also important to reduce your risk of infection.  Remember - BRUSH YOUR TEETH THE MORNING OF SURGERY WITH YOUR REGULAR TOOTHPASTE  Please do not use if you have an allergy to CHG or antibacterial soaps. If your skin becomes reddened/irritated stop using the CHG.  Do not shave (including legs and underarms) for at least 48 hours prior to first CHG shower. It is OK to shave your face.  Please follow these instructions carefully.   1. Shower the NIGHT BEFORE SURGERY and the MORNING OF SURGERY with CHG Soap.   2. If you chose to wash your hair, wash your hair first as usual with your normal shampoo.  3. After you shampoo, rinse your hair and body thoroughly to remove the shampoo.  4. Use CHG as you would any other liquid  soap. You can apply CHG directly to the skin and wash gently with a scrungie or a clean washcloth.   5. Apply the CHG Soap to your body ONLY FROM THE NECK DOWN.  Do not use on open wounds or open sores. Avoid contact with your eyes, ears, mouth and genitals (private parts). Wash Face and genitals (private parts)  with your normal soap.   6. Wash thoroughly, paying special attention to the area where your surgery will be performed.  7. Thoroughly rinse your body with warm water from the neck down.  8. DO NOT shower/wash with your normal soap after using and rinsing off the CHG Soap.  9. Pat  yourself dry with a CLEAN TOWEL.  10. Wear CLEAN PAJAMAS to bed the night before surgery, wear comfortable clothes the morning of surgery  11. Place CLEAN SHEETS on your bed the night of your first shower and DO NOT SLEEP WITH PETS.    Day of Surgery:  Please shower the morning of surgery with the CHG soap Do not apply any deodorants/lotions. Please wear clean clothes to the hospital/surgery center.   Remember to brush your teeth WITH YOUR REGULAR TOOTHPASTE.   Please read over the following fact sheets that you were given.

## 2020-02-16 ENCOUNTER — Ambulatory Visit (HOSPITAL_COMMUNITY)
Admission: RE | Admit: 2020-02-16 | Discharge: 2020-02-16 | Disposition: A | Discharge status: Home / Self Care | Payer: BC Managed Care – PPO | Source: Ambulatory Visit | Attending: Thoracic Surgery (Cardiothoracic Vascular Surgery) | Admitting: Thoracic Surgery (Cardiothoracic Vascular Surgery)

## 2020-02-16 ENCOUNTER — Encounter: Payer: Self-pay | Admitting: Thoracic Surgery (Cardiothoracic Vascular Surgery)

## 2020-02-16 ENCOUNTER — Other Ambulatory Visit: Payer: Self-pay

## 2020-02-16 ENCOUNTER — Other Ambulatory Visit (HOSPITAL_COMMUNITY)
Admission: RE | Admit: 2020-02-16 | Discharge: 2020-02-16 | Disposition: A | Discharge status: Home / Self Care | Payer: BC Managed Care – PPO | Source: Ambulatory Visit | Attending: Thoracic Surgery (Cardiothoracic Vascular Surgery) | Admitting: Thoracic Surgery (Cardiothoracic Vascular Surgery)

## 2020-02-16 ENCOUNTER — Ambulatory Visit (INDEPENDENT_AMBULATORY_CARE_PROVIDER_SITE_OTHER): Payer: BC Managed Care – PPO | Admitting: Thoracic Surgery (Cardiothoracic Vascular Surgery)

## 2020-02-16 ENCOUNTER — Encounter (HOSPITAL_COMMUNITY): Payer: Self-pay

## 2020-02-16 ENCOUNTER — Encounter (HOSPITAL_COMMUNITY)
Admission: RE | Admit: 2020-02-16 | Discharge: 2020-02-16 | Disposition: A | Discharge status: Home / Self Care | Payer: BC Managed Care – PPO | Source: Ambulatory Visit | Attending: Thoracic Surgery (Cardiothoracic Vascular Surgery) | Admitting: Thoracic Surgery (Cardiothoracic Vascular Surgery)

## 2020-02-16 VITALS — BP 132/84 | HR 84 | Temp 97.3°F | Resp 16 | Ht 70.0 in | Wt 193.0 lb

## 2020-02-16 DIAGNOSIS — I2510 Atherosclerotic heart disease of native coronary artery without angina pectoris: Secondary | ICD-10-CM | POA: Diagnosis not present

## 2020-02-16 DIAGNOSIS — Z20822 Contact with and (suspected) exposure to covid-19: Secondary | ICD-10-CM | POA: Diagnosis not present

## 2020-02-16 DIAGNOSIS — Z951 Presence of aortocoronary bypass graft: Secondary | ICD-10-CM | POA: Diagnosis not present

## 2020-02-16 DIAGNOSIS — I340 Nonrheumatic mitral (valve) insufficiency: Secondary | ICD-10-CM

## 2020-02-16 DIAGNOSIS — I4891 Unspecified atrial fibrillation: Secondary | ICD-10-CM | POA: Insufficient documentation

## 2020-02-16 DIAGNOSIS — Z01810 Encounter for preprocedural cardiovascular examination: Secondary | ICD-10-CM | POA: Diagnosis not present

## 2020-02-16 DIAGNOSIS — I447 Left bundle-branch block, unspecified: Secondary | ICD-10-CM | POA: Diagnosis not present

## 2020-02-16 DIAGNOSIS — Z01818 Encounter for other preprocedural examination: Secondary | ICD-10-CM | POA: Diagnosis not present

## 2020-02-16 DIAGNOSIS — I4819 Other persistent atrial fibrillation: Secondary | ICD-10-CM

## 2020-02-16 DIAGNOSIS — I25119 Atherosclerotic heart disease of native coronary artery with unspecified angina pectoris: Secondary | ICD-10-CM

## 2020-02-16 HISTORY — DX: Atherosclerotic heart disease of native coronary artery without angina pectoris: I25.10

## 2020-02-16 LAB — BLOOD GAS, ARTERIAL
Acid-Base Excess: 0.4 mmol/L (ref 0.0–2.0)
Bicarbonate: 24.2 mmol/L (ref 20.0–28.0)
Drawn by: 421801
FIO2: 21
O2 Saturation: 98.2 %
Patient temperature: 37
pCO2 arterial: 37 mmHg (ref 32.0–48.0)
pH, Arterial: 7.43 (ref 7.350–7.450)
pO2, Arterial: 112 mmHg — ABNORMAL HIGH (ref 83.0–108.0)

## 2020-02-16 LAB — CBC
HCT: 40.4 % (ref 39.0–52.0)
Hemoglobin: 13.6 g/dL (ref 13.0–17.0)
MCH: 30.4 pg (ref 26.0–34.0)
MCHC: 33.7 g/dL (ref 30.0–36.0)
MCV: 90.2 fL (ref 80.0–100.0)
Platelets: 179 10*3/uL (ref 150–400)
RBC: 4.48 MIL/uL (ref 4.22–5.81)
RDW: 13.1 % (ref 11.5–15.5)
WBC: 6.4 10*3/uL (ref 4.0–10.5)
nRBC: 0 % (ref 0.0–0.2)

## 2020-02-16 LAB — URINALYSIS, ROUTINE W REFLEX MICROSCOPIC
Bilirubin Urine: NEGATIVE
Glucose, UA: NEGATIVE mg/dL
Hgb urine dipstick: NEGATIVE
Ketones, ur: NEGATIVE mg/dL
Leukocytes,Ua: NEGATIVE
Nitrite: NEGATIVE
Protein, ur: NEGATIVE mg/dL
Specific Gravity, Urine: 1.021 (ref 1.005–1.030)
pH: 6 (ref 5.0–8.0)

## 2020-02-16 LAB — COMPREHENSIVE METABOLIC PANEL
ALT: 19 U/L (ref 0–44)
AST: 22 U/L (ref 15–41)
Albumin: 3.9 g/dL (ref 3.5–5.0)
Alkaline Phosphatase: 53 U/L (ref 38–126)
Anion gap: 8 (ref 5–15)
BUN: 19 mg/dL (ref 8–23)
CO2: 22 mmol/L (ref 22–32)
Calcium: 9 mg/dL (ref 8.9–10.3)
Chloride: 107 mmol/L (ref 98–111)
Creatinine, Ser: 0.95 mg/dL (ref 0.61–1.24)
GFR calc Af Amer: >60 mL/min (ref >60)
GFR calc non Af Amer: >60 mL/min (ref >60)
Glucose, Bld: 106 mg/dL — ABNORMAL HIGH (ref 70–99)
Potassium: 4.2 mmol/L (ref 3.5–5.1)
Sodium: 137 mmol/L (ref 135–145)
Total Bilirubin: 1.1 mg/dL (ref 0.3–1.2)
Total Protein: 6.7 g/dL (ref 6.5–8.1)

## 2020-02-16 LAB — ABO/RH: ABO/RH(D): O POS

## 2020-02-16 LAB — SURGICAL PCR SCREEN
MRSA, PCR: NEGATIVE
Staphylococcus aureus: NEGATIVE

## 2020-02-16 LAB — HEMOGLOBIN A1C
Hgb A1c MFr Bld: 5.2 % (ref 4.8–5.6)
Mean Plasma Glucose: 102.54 mg/dL

## 2020-02-16 NOTE — Progress Notes (Signed)
[address]       [place] [address]             [phone]     CARDIOTHORACIC SURGERY OFFICE NOTE  Referring Provider is Patwardhan, Reynold Bowen, MD PCP is [name], MD   HPI:  Patient is a 64 yo male with history of GERD, mitral valve prolapse with life-long heart murmur and CAD who returns to the office with tentative plans to proceed with elective surgical intervention later this week for management of recently diagnosed severe, symptomatic primary mitral regurgitation, single-vessel coronary artery disease with stable angina pectoris and presumably recent onset persistent atrial fibrillation.  He was originally seen in consultation on February 05, 2020.  He reports no new problems or complaints over the last few weeks.  He stopped taking Xarelto 2 days ago in anticipation of his upcoming surgery.  Current Outpatient Medications  Medication Sig Dispense Refill  . furosemide (LASIX) 20 MG tablet Take 1 tablet (20 mg total) by mouth daily. 90 tablet 3  . nitroGLYCERIN (NITROSTAT) 0.4 MG SL tablet Place 1 tablet (0.4 mg total) under the tongue every 5 (five) minutes as needed for chest pain. 90 tablet 3  . omeprazole (PRILOSEC OTC) 20 MG tablet Take 20 mg by mouth daily.    . Potassium 99 MG TABS Take 99 mg by mouth daily.    . rivaroxaban (XARELTO) 20 MG TABS tablet Take 1 tablet (20 mg total) by mouth daily with supper. (Patient not taking: Reported on 02/16/2020) 60 tablet 2   No current facility-administered medications for this visit.      Physical Exam:   BP 132/84 (BP Location: Left Arm, Patient Position: Sitting, Cuff Size: Normal)   Pulse 84   Temp (!) 97.3 F (36.3 C)   Resp 16   Ht 5\' 10"  (1.778 m)   Wt 193 lb (87.5 kg)   SpO2 99% Comment: RA  BMI 27.69 kg/m   General:  Well-appearing  Chest:   Clear to auscultation  CV:   Irregular rate and rhythm with prominent systolic murmur  Incisions:  n/a  Abdomen:  Soft  nontender  Extremities:  Warm and well-perfused  Diagnostic Tests:  CHEST - 2 VIEW  COMPARISON:  10/04/2017  FINDINGS: The heart size and mediastinal contours are within normal limits. Both lungs are clear. The visualized skeletal structures are unremarkable.  IMPRESSION: No acute abnormality of the lungs.   Electronically Signed   By: Eddie Candle M.D.   On: 02/16/2020 10:11   Impression:  Patient has mitral valve prolapse with stage D severe symptomatic primary mitral regurgitation, severe vessel coronary artery disease with stable exertional angina, and recent onset persistent atrial fibrillation.  He describes stable symptoms of exertional chest discomfort and shortness of breath that have been slowly progressing over the past year consistent with classical angina pectoris and chronic diastolic congestive heart failure, functional class II.    I have personally reviewed the patient's recent transesophageal echocardiogram and diagnostic cardiac catheterization.  TEE reveals myxomatous degenerative disease of the mitral valve with an obvious flail segment involving a portion of the posterior leaflet with ruptured primary chordae tendinae and severe mitral regurgitation.  The jet of regurgitation is eccentric.  The left atrium is dilated.  There is normal left ventricular size and systolic function.  Catheterization reveals multivessel coronary artery disease with 100% chronic occlusion of the mid left anterior descending coronary artery just after a medium size diagonal  branch.  There are faint collaterals filling the terminal portion of the left anterior descending coronary artery from both the diagonal branch and the right coronary circulation.  The distal portion of the left anterior descending coronary artery is not well-visualized and may be diffusely diseased.  There is nonobstructive disease in the left circumflex and right coronary territories.  There was mild pulmonary  hypertension with large V waves on pulmonary capillary wedge tracing.  I agree the patient needs mitral valve repair.  Although the patient's terminal portion of the left anterior descending coronary artery may be diffusely diseased, I would favor an attempt at coronary artery bypass grafting at the time of surgery.  Finally, the patient may benefit from concomitant Maze procedure.   Plan:  The patient and his wife were again counseled at length regarding the indications, risks and potential benefits of mitral valve repair and coronary artery bypass grafting.  The rationale for elective surgery has been explained, including a comparison between surgery and continued medical therapy with close follow-up.  The likelihood of successful and durable mitral valve repair has been discussed with particular reference to the findings of their recent echocardiogram.  Based upon these findings and previous experience, I have quoted them a greater than 95 percent likelihood of successful valve repair with less than 1 percent risk of mortality or major morbidity.  Alternative surgical approaches have been discussed including a comparison between conventional sternotomy and minimally-invasive techniques.  The rationale for coronary artery bypass grafting and the associated need to proceed with surgery via conventional median sternotomy was discussed.  The relative risks and benefits of performing a maze procedure at the time of his surgery was discussed at length, including the expected likelihood of long term freedom from recurrent symptomatic atrial fibrillation and/or atrial flutter.  Expectations for the patient's postoperative convalescence has been discussed.    The patient understands and accepts all potential risks of surgery including but not limited to risk of death, stroke or other neurologic complication, myocardial infarction, congestive heart failure, respiratory failure, renal failure, bleeding  requiring transfusion and/or reexploration, arrhythmia, infection or other wound complications, pneumonia, pleural and/or pericardial effusion, pulmonary embolus, aortic dissection or other major vascular complication, late recurrence of ischemic heart disease, or delayed complications related to valve repair or replacement including but not limited to structural valve deterioration and failure, thrombosis, embolization, endocarditis, or paravalvular leak.   All of their questions have been answered.    I spent in excess of 15 minutes during the conduct of this office consultation and >50% of this time involved direct face-to-face encounter with the patient for counseling and/or coordination of their care.    Valentina Gu. Roxy Manns, MD 02/16/2020 11:05 AM

## 2020-02-16 NOTE — Patient Instructions (Signed)
Do not take Xarelto  Continue taking all other medications without change through the day before surgery.  Make sure to bring all of your medications with you when you come for your Pre-Admission Testing appointment at [facility] Short-Stay Department.  Have nothing to eat or drink after midnight the night before surgery.  On the morning of surgery take only Prilosec with a sip of water.  At your appointment for Pre-Admission Testing at the [facility] Short-Stay Department you will be asked to sign permission forms for your upcoming surgery.  By definition your signature on these forms implies that you and/or your designee provide full informed consent for your planned surgical procedure(s), that alternative treatment options have been discussed, that you understand and accept any and all potential risks, and that you have some understanding of what to expect for your post-operative convalescence.  For any major cardiac surgical procedure potential operative risks include but are not limited to at least some risk of death, stroke or other neurologic complication, myocardial infarction, congestive heart failure, respiratory failure, renal failure, bleeding requiring blood transfusion and/or reexploration, irregular heart rhythm, heart block or bradycardia requiring permanent pacemaker, pneumonia, pericardial effusion, pleural effusion, wound infection, pulmonary embolus or other thromboembolic complication, chronic pain, or other complications related to the specific procedure(s) performed.  Please call to schedule a follow-up appointment in our office prior to surgery if you have any unresolved questions about your planned surgical procedure, the associated risks, alternative treatment options, and/or expectations for your post-operative recovery.

## 2020-02-16 NOTE — Progress Notes (Signed)
PCP - Elisabeth Cara Cardiologist - [place]  Chest x-ray - 02/16/20 EKG - 02/16/20 Stress Test - 2003 ECHO - 01/14/20 Cardiac Cath - 02/03/20    Blood Thinner Instructions: LD on 02/14/20, will need PTT, PT/INR DOS  COVID TEST- 02/16/20   Anesthesia review: yes, cardiac hx  Patient denies shortness of breath, fever, cough and chest pain at PAT appointment   All instructions explained to the patient, with a verbal understanding of the material. Patient agrees to go over the instructions while at home for a better understanding. Patient also instructed to self quarantine after being tested for COVID-19. The opportunity to ask questions was provided.

## 2020-02-16 NOTE — Progress Notes (Signed)
Results sent to [facility] with TCTS  Contains abnormal data Blood gas, arterial on room air Order: [id number] Status:  Final result  Visible to patient:  Yes (MyChart)  Next appt:  03/04/2020 at 10:00 AM in Cardiology [facility] Esther Hardy, MD)  Dx:  Mitral valve insufficiency, unspecifi...  Ref Range & Units 09:33  FIO2  21.00   pH, Arterial 7.350 - 7.450 7.430   pCO2 arterial 32.0 - 48.0 mmHg 37.0   pO2, Arterial 83.0 - 108.0 mmHg 112High    Bicarbonate 20.0 - 28.0 mmol/L 24.2   Acid-Base Excess 0.0 - 2.0 mmol/L 0.4   O2 Saturation % 98.2   Patient temperature  37.0   Collection site  LEFT BRACHIAL   Drawn by  [id number]   Sample type  ARTERIAL DRAW   Allens test (pass/fail) PASS BRACHIAL ARTERYAbnormal    Comment: Performed at [facility], [place] [address] [place], [place] [address]  Resulting Agency  [facility]      Specimen Collected: 02/16/20 09:33  Last Resulted: 02/16/20 09:53      Lab Flowsheet    Order Details    View Encounter    Lab and Collection Details    Routing    Result History

## 2020-02-16 NOTE — Progress Notes (Signed)
VASCULAR LAB PRELIMINARY  PRELIMINARY  PRELIMINARY  PRELIMINARY  Pre CABG Dopplers completed.    Preliminary report:  See CV proc for preliminary results.   Terrell Shimko, RVT 02/16/2020, 8:47 AM

## 2020-02-17 ENCOUNTER — Encounter (HOSPITAL_COMMUNITY): Payer: Self-pay

## 2020-02-17 LAB — SARS CORONAVIRUS 2 (TAT 6-24 HRS): SARS Coronavirus 2: NEGATIVE

## 2020-02-17 NOTE — Progress Notes (Signed)
Anesthesia Chart Review:  Case: [id number] Date/Time: 02/19/20 0715   Procedures:      MITRAL VALVE REPAIR (MVR) (N/A Chest)     CORONARY ARTERY BYPASS GRAFTING (CABG) (N/A Chest)     MAZE (N/A )     TRANSESOPHAGEAL ECHOCARDIOGRAM (TEE) (N/A )   Anesthesia type: General   Pre-op diagnosis:      MR     CAD     AFIB   Location: [facility] OR ROOM 15 / [facility] OR   Surgeons: Rexene Alberts, MD      DISCUSSION: Patient is a 64 year old male scheduled for the above procedure.  History includes never smoker, CAD (1V), murmur/MVP/severe MR, afib (diagnosed 01/28/20), GERD   Last Xarelto 02/14/20. He is for PT/PTT on the day of surgery.  02/16/2020 presurgical COVID-19 test negative.   VS: BP 122/88   Pulse 81   Temp 36.5 C (Oral)   Resp 18   Ht 5\' 10"  (1.778 m)   Wt 87.5 kg   SpO2 100%   BMI 27.69 kg/m   PROVIDERS: [name], MD is PCP  Vernell Leep, MD is cardiologist   LABS: Labs reviewed: Acceptable for surgery. (all labs ordered are listed, but only abnormal results are displayed)  Labs Reviewed  BLOOD GAS, ARTERIAL - Abnormal; Notable for the following components:      Result Value   pO2, Arterial 112 (*)    Allens test (pass/fail) BRACHIAL ARTERY (*)    All other components within normal limits  COMPREHENSIVE METABOLIC PANEL - Abnormal; Notable for the following components:   Glucose, Bld 106 (*)    All other components within normal limits  SURGICAL PCR SCREEN  CBC  HEMOGLOBIN A1C  URINALYSIS, ROUTINE W REFLEX MICROSCOPIC  TYPE AND SCREEN  ABO/RH     IMAGES: CXR 02/16/20: FINDINGS: The heart size and mediastinal contours are within normal limits. Both lungs are clear. The visualized skeletal structures are unremarkable. IMPRESSION: No acute abnormality of the lungs.   EKG:  EKG 02/16/20:  Atrial fibrillation Incomplete left bundle branch block Abnormal ECG New since previous tracing 04 Oct 2017 [Afib/flutter diagnosed 01/07/20] Confirmed by Dixie Dials [phone]) on 02/16/2020 11:13:44 AM  EKG 01/07/20: Atrial flutter-fibrillation  - occasional ectopic ventricular beat    ABNORMAL RHYTHM   CV: Carotid US 02/16/20: Summary:  Right Carotid: The extracranial vessels were near-normal with only minimal wall thickening or plaque.  Left Carotid: The extracranial vessels were near-normal with only minimal wall thickening or plaque.  Vertebrals: Bilateral vertebral arteries demonstrate antegrade flow.  Subclavians: Normal flow hemodynamics were seen in bilateral subclavian arteries.   RHC/LHC 02/03/20: LM: Normal LAD: Mid LAD CTO with faint collaterals from Diag and RCA LCx: Prox OM1 focal 40% stenosis RCA: Normal RA: 6 mmHg RV: 31/2 mmHg PA: 36/14 mmHg, mean PAP 23 mmHg PCW: Mean 16 mmHg with tall V wave LVEDP 7 mmHg CO: 5.4 L/min CI: 2.6 L/min.m2 Impression: Single vessel obstructive CAD (LAD CTO) Nonobstructive prox OM1 disease Tall V waves s/o severe mitral regurgitation  TEE 02/03/20: IMPRESSIONS  1. Left ventricular ejection fraction, by estimation, is 55 to 60%. The  left ventricle has normal function. The left ventricle has no regional  wall motion abnormalities. Left ventricular diastolic function could not  be evaluated.  2. Right ventricular systolic function is normal. The right ventricular  size is normal.  3. Left atrial size was severely dilated. No left atrial/left atrial  appendage thrombus was detected.  4. Myxomatous mitral  valve with flail P1-P2 with severe eccentirc mitral  regurgitation.  5. No other significant valvular abnormality.  Echocardiogram 01/14/2020:  Left ventricle cavity is normal in size. Mild concentric hypertrophy of  the left ventricle. Normal global wall motion. Normal LV systolic function  with EF 55%. Diastolic function not assessed due to severity of mitral  regurgitation.   Left atrial cavity is severely dilated.  Flail posterior leaflet with mild thickening. Severe, eccentric,   anteriorly directed mitral regurgitation.  Mild tricuspid regurgitation.  Estimated pulmonary artery systolic pressure is 29 mmHg.   Past Medical History:  Diagnosis Date  . Atrial fibrillation ([place])   . Colon polyps   . Coronary artery disease   . GERD (gastroesophageal reflux disease)   . Heart murmur   . Nonrheumatic mitral valve regurgitation     Past Surgical History:  Procedure Laterality Date  . APPENDECTOMY  1982  . COLONOSCOPY    . HIP SURGERY Right 2008   8-9 screws- s/p 30 ft fall  . POLYPECTOMY    . radial head surgery Right 2008   from 30 ft. fall-   . RIGHT/LEFT HEART CATH AND CORONARY ANGIOGRAPHY N/A 02/03/2020   Procedure: RIGHT/LEFT HEART CATH AND CORONARY ANGIOGRAPHY;  Surgeon: Nigel Mormon, MD;  Location: [facility] CV LAB;  Service: Cardiovascular;  Laterality: N/A;  . TEE WITHOUT CARDIOVERSION N/A 02/03/2020   Procedure: TRANSESOPHAGEAL ECHOCARDIOGRAM (TEE);  Surgeon: Nigel Mormon, MD;  Location[facility] ENDOSCOPY;  Service: Cardiovascular;  Laterality: N/A;  . UPPER GASTROINTESTINAL ENDOSCOPY      MEDICATIONS: . furosemide (LASIX) 20 MG tablet  . nitroGLYCERIN (NITROSTAT) 0.4 MG SL tablet  . omeprazole (PRILOSEC OTC) 20 MG tablet  . Potassium 99 MG TABS  . rivaroxaban (XARELTO) 20 MG TABS tablet   No current facility-administered medications for this encounter.     Myra Gianotti, PA-C Surgical Short Stay/Anesthesiology [facility] Phone [phone] [facility] Phone [phone] 02/17/2020 2:12 PM

## 2020-02-17 NOTE — Anesthesia Preprocedure Evaluation (Addendum)
Anesthesia Evaluation  Patient identified by MRN, date of birth, ID band Patient awake    Reviewed: Allergy & Precautions, NPO status , Patient's Chart, lab work & pertinent test results  Airway Mallampati: II  TM Distance: >3 FB Neck ROM: Full    Dental  (+) Teeth Intact, Dental Advisory Given   Pulmonary    breath sounds clear to auscultation       Cardiovascular  Rhythm:Irregular Rate:Normal + Systolic murmurs    Neuro/Psych    GI/Hepatic   Endo/Other    Renal/GU      Musculoskeletal   Abdominal   Peds  Hematology   Anesthesia Other Findings   Reproductive/Obstetrics                             Anesthesia Physical Anesthesia Plan  ASA: III  Anesthesia Plan: General   Post-op Pain Management:    Induction: Intravenous  PONV Risk Score and Plan: Ondansetron and Dexamethasone  Airway Management Planned: Oral ETT  Additional Equipment: Arterial line, PA Cath, 3D TEE and Ultrasound Guidance Line Placement  Intra-op Plan:   Post-operative Plan: Possible Post-op intubation/ventilation  Informed Consent: I have reviewed the patients History and Physical, chart, labs and discussed the procedure including the risks, benefits and alternatives for the proposed anesthesia with the patient or authorized representative who has indicated his/her understanding and acceptance.     Dental advisory given  Plan Discussed with: CRNA and Anesthesiologist  Anesthesia Plan Comments: (PAT note written 02/17/2020 by Myra Gianotti, PA-C. )       Anesthesia Quick Evaluation

## 2020-02-18 MED ORDER — SODIUM CHLORIDE 0.9 % IV SOLN
750.0000 mg | INTRAVENOUS | Status: AC
  Administered 2020-02-19: 750 mg via INTRAVENOUS
  Filled 2020-02-18: qty 750

## 2020-02-18 MED ORDER — SODIUM CHLORIDE 0.9 % IV SOLN
1.5000 g | INTRAVENOUS | Status: AC
  Administered 2020-02-19: 1.5 g via INTRAVENOUS
  Filled 2020-02-18: qty 1.5

## 2020-02-18 MED ORDER — TRANEXAMIC ACID 1000 MG/10ML IV SOLN
1.5000 mg/kg/h | INTRAVENOUS | Status: AC
  Administered 2020-02-19: 1.5 mg/kg/h via INTRAVENOUS
  Filled 2020-02-18: qty 25

## 2020-02-18 MED ORDER — PLASMA-LYTE 148 IV SOLN
INTRAVENOUS | Status: DC
  Filled 2020-02-18: qty 2.5

## 2020-02-18 MED ORDER — INSULIN REGULAR(HUMAN) IN NACL 100-0.9 UT/100ML-% IV SOLN
INTRAVENOUS | Status: AC
  Administered 2020-02-19: .9 [IU]/h via INTRAVENOUS
  Filled 2020-02-18: qty 100

## 2020-02-18 MED ORDER — POTASSIUM CHLORIDE 2 MEQ/ML IV SOLN
80.0000 meq | INTRAVENOUS | Status: DC
  Filled 2020-02-18: qty 40

## 2020-02-18 MED ORDER — MILRINONE LACTATE IN DEXTROSE 20-5 MG/100ML-% IV SOLN
0.3000 ug/kg/min | INTRAVENOUS | Status: AC
  Administered 2020-02-19: .25 ug/kg/min via INTRAVENOUS
  Filled 2020-02-18: qty 100

## 2020-02-18 MED ORDER — VANCOMYCIN HCL 1000 MG IV SOLR
INTRAVENOUS | Status: DC
  Filled 2020-02-18: qty 1000

## 2020-02-18 MED ORDER — DEXMEDETOMIDINE HCL IN NACL 400 MCG/100ML IV SOLN
0.1000 ug/kg/h | INTRAVENOUS | Status: AC
  Administered 2020-02-19: .3 ug/kg/h via INTRAVENOUS
  Filled 2020-02-18: qty 100

## 2020-02-18 MED ORDER — NOREPINEPHRINE 4 MG/250ML-% IV SOLN
0.0000 ug/min | INTRAVENOUS | Status: DC
  Filled 2020-02-18: qty 250

## 2020-02-18 MED ORDER — PHENYLEPHRINE HCL-NACL 20-0.9 MG/250ML-% IV SOLN
30.0000 ug/min | INTRAVENOUS | Status: AC
  Administered 2020-02-19: 15 ug/min via INTRAVENOUS
  Filled 2020-02-18: qty 250

## 2020-02-18 MED ORDER — TRANEXAMIC ACID (OHS) PUMP PRIME SOLUTION
2.0000 mg/kg | INTRAVENOUS | Status: DC
  Filled 2020-02-18: qty 1.75

## 2020-02-18 MED ORDER — MANNITOL 20 % IV SOLN
INTRAVENOUS | Status: DC
  Filled 2020-02-18: qty 13

## 2020-02-18 MED ORDER — GLUTARALDEHYDE 0.625% SOAKING SOLUTION
TOPICAL | Status: DC
  Filled 2020-02-18: qty 50

## 2020-02-18 MED ORDER — SODIUM CHLORIDE 0.9 % IV SOLN
INTRAVENOUS | Status: DC
  Filled 2020-02-18: qty 30

## 2020-02-18 MED ORDER — EPINEPHRINE HCL 5 MG/250ML IV SOLN IN NS
0.0000 ug/min | INTRAVENOUS | Status: DC
  Filled 2020-02-18: qty 250

## 2020-02-18 MED ORDER — VANCOMYCIN HCL 1500 MG/300ML IV SOLN
1500.0000 mg | INTRAVENOUS | Status: AC
  Administered 2020-02-19: 1500 mg via INTRAVENOUS
  Filled 2020-02-18: qty 300

## 2020-02-18 MED ORDER — NITROGLYCERIN IN D5W 200-5 MCG/ML-% IV SOLN
2.0000 ug/min | INTRAVENOUS | Status: DC
  Filled 2020-02-18: qty 250

## 2020-02-18 MED ORDER — TRANEXAMIC ACID (OHS) BOLUS VIA INFUSION
15.0000 mg/kg | INTRAVENOUS | Status: AC
  Administered 2020-02-19: 1312.5 mg via INTRAVENOUS
  Filled 2020-02-18: qty 1313

## 2020-02-18 MED ORDER — MAGNESIUM SULFATE 50 % IJ SOLN
40.0000 meq | INTRAMUSCULAR | Status: DC
  Filled 2020-02-18: qty 9.85

## 2020-02-19 ENCOUNTER — Other Ambulatory Visit: Payer: Self-pay

## 2020-02-19 ENCOUNTER — Encounter (HOSPITAL_COMMUNITY)
Admission: RE | Disposition: A | Discharge status: Home / Self Care | Payer: Self-pay | Source: Home / Self Care | Attending: Thoracic Surgery (Cardiothoracic Vascular Surgery)

## 2020-02-19 ENCOUNTER — Inpatient Hospital Stay (HOSPITAL_COMMUNITY)
Admission: RE | Admit: 2020-02-19 | Discharge: 2020-02-24 | DRG: 220 | Disposition: A | Discharge status: Home / Self Care | Payer: BC Managed Care – PPO | Attending: Thoracic Surgery (Cardiothoracic Vascular Surgery) | Admitting: Thoracic Surgery (Cardiothoracic Vascular Surgery)

## 2020-02-19 ENCOUNTER — Encounter (HOSPITAL_COMMUNITY): Payer: Self-pay | Admitting: Thoracic Surgery (Cardiothoracic Vascular Surgery)

## 2020-02-19 ENCOUNTER — Inpatient Hospital Stay (HOSPITAL_COMMUNITY): Payer: BC Managed Care – PPO

## 2020-02-19 ENCOUNTER — Inpatient Hospital Stay (HOSPITAL_COMMUNITY): Payer: BC Managed Care – PPO | Admitting: Vascular Surgery

## 2020-02-19 ENCOUNTER — Inpatient Hospital Stay (HOSPITAL_COMMUNITY): Payer: BC Managed Care – PPO | Admitting: Certified Registered Nurse Anesthetist

## 2020-02-19 DIAGNOSIS — R918 Other nonspecific abnormal finding of lung field: Secondary | ICD-10-CM | POA: Diagnosis not present

## 2020-02-19 DIAGNOSIS — I2582 Chronic total occlusion of coronary artery: Secondary | ICD-10-CM | POA: Diagnosis present

## 2020-02-19 DIAGNOSIS — Z7982 Long term (current) use of aspirin: Secondary | ICD-10-CM

## 2020-02-19 DIAGNOSIS — J90 Pleural effusion, not elsewhere classified: Secondary | ICD-10-CM | POA: Diagnosis not present

## 2020-02-19 DIAGNOSIS — I5032 Chronic diastolic (congestive) heart failure: Secondary | ICD-10-CM | POA: Diagnosis not present

## 2020-02-19 DIAGNOSIS — I341 Nonrheumatic mitral (valve) prolapse: Secondary | ICD-10-CM | POA: Diagnosis not present

## 2020-02-19 DIAGNOSIS — I059 Rheumatic mitral valve disease, unspecified: Secondary | ICD-10-CM | POA: Diagnosis not present

## 2020-02-19 DIAGNOSIS — I081 Rheumatic disorders of both mitral and tricuspid valves: Secondary | ICD-10-CM | POA: Diagnosis not present

## 2020-02-19 DIAGNOSIS — Z952 Presence of prosthetic heart valve: Secondary | ICD-10-CM | POA: Diagnosis not present

## 2020-02-19 DIAGNOSIS — Z951 Presence of aortocoronary bypass graft: Secondary | ICD-10-CM

## 2020-02-19 DIAGNOSIS — J9811 Atelectasis: Secondary | ICD-10-CM

## 2020-02-19 DIAGNOSIS — D62 Acute posthemorrhagic anemia: Secondary | ICD-10-CM | POA: Diagnosis not present

## 2020-02-19 DIAGNOSIS — Z8679 Personal history of other diseases of the circulatory system: Secondary | ICD-10-CM

## 2020-02-19 DIAGNOSIS — I4819 Other persistent atrial fibrillation: Secondary | ICD-10-CM | POA: Diagnosis not present

## 2020-02-19 DIAGNOSIS — I340 Nonrheumatic mitral (valve) insufficiency: Secondary | ICD-10-CM | POA: Diagnosis not present

## 2020-02-19 DIAGNOSIS — K219 Gastro-esophageal reflux disease without esophagitis: Secondary | ICD-10-CM | POA: Diagnosis present

## 2020-02-19 DIAGNOSIS — Z7901 Long term (current) use of anticoagulants: Secondary | ICD-10-CM

## 2020-02-19 DIAGNOSIS — I440 Atrioventricular block, first degree: Secondary | ICD-10-CM | POA: Diagnosis not present

## 2020-02-19 DIAGNOSIS — Z20822 Contact with and (suspected) exposure to covid-19: Secondary | ICD-10-CM | POA: Diagnosis present

## 2020-02-19 DIAGNOSIS — I2510 Atherosclerotic heart disease of native coronary artery without angina pectoris: Secondary | ICD-10-CM | POA: Diagnosis not present

## 2020-02-19 DIAGNOSIS — Z48812 Encounter for surgical aftercare following surgery on the circulatory system: Secondary | ICD-10-CM | POA: Diagnosis not present

## 2020-02-19 DIAGNOSIS — R110 Nausea: Secondary | ICD-10-CM | POA: Diagnosis not present

## 2020-02-19 DIAGNOSIS — Z452 Encounter for adjustment and management of vascular access device: Secondary | ICD-10-CM | POA: Diagnosis not present

## 2020-02-19 DIAGNOSIS — Z9889 Other specified postprocedural states: Secondary | ICD-10-CM

## 2020-02-19 DIAGNOSIS — I4891 Unspecified atrial fibrillation: Secondary | ICD-10-CM | POA: Diagnosis not present

## 2020-02-19 DIAGNOSIS — I272 Pulmonary hypertension, unspecified: Secondary | ICD-10-CM | POA: Diagnosis present

## 2020-02-19 DIAGNOSIS — I208 Other forms of angina pectoris: Secondary | ICD-10-CM | POA: Diagnosis present

## 2020-02-19 HISTORY — PX: MITRAL VALVE REPAIR: SHX2039

## 2020-02-19 HISTORY — DX: Other specified postprocedural states: Z98.890

## 2020-02-19 HISTORY — DX: Nonrheumatic mitral (valve) prolapse: I34.1

## 2020-02-19 HISTORY — DX: Personal history of other diseases of the circulatory system: Z86.79

## 2020-02-19 HISTORY — PX: MAZE: SHX5063

## 2020-02-19 HISTORY — PX: CLIPPING OF ATRIAL APPENDAGE: SHX5773

## 2020-02-19 HISTORY — DX: Presence of aortocoronary bypass graft: Z95.1

## 2020-02-19 HISTORY — PX: CORONARY ARTERY BYPASS GRAFT: SHX141

## 2020-02-19 HISTORY — PX: TEE WITHOUT CARDIOVERSION: SHX5443

## 2020-02-19 HISTORY — DX: Other persistent atrial fibrillation: I48.19

## 2020-02-19 LAB — POCT I-STAT 7, (LYTES, BLD GAS, ICA,H+H)
Acid-base deficit: 3 mmol/L — ABNORMAL HIGH (ref 0.0–2.0)
Acid-base deficit: 4 mmol/L — ABNORMAL HIGH (ref 0.0–2.0)
Acid-base deficit: 5 mmol/L — ABNORMAL HIGH (ref 0.0–2.0)
Acid-base deficit: 6 mmol/L — ABNORMAL HIGH (ref 0.0–2.0)
Bicarbonate: 25.1 mmol/L (ref 20.0–28.0)
Bicarbonate: 23.1 mmol/L (ref 20.0–28.0)
Bicarbonate: 22.1 mmol/L (ref 20.0–28.0)
Bicarbonate: 19.2 mmol/L — ABNORMAL LOW (ref 20.0–28.0)
Bicarbonate: 19.6 mmol/L — ABNORMAL LOW (ref 20.0–28.0)
Calcium, Ion: 1.05 mmol/L — ABNORMAL LOW (ref 1.15–1.40)
Calcium, Ion: 1.03 mmol/L — ABNORMAL LOW (ref 1.15–1.40)
Calcium, Ion: 1.11 mmol/L — ABNORMAL LOW (ref 1.15–1.40)
Calcium, Ion: 1.03 mmol/L — ABNORMAL LOW (ref 1.15–1.40)
Calcium, Ion: 1.08 mmol/L — ABNORMAL LOW (ref 1.15–1.40)
HCT: 34 % — ABNORMAL LOW (ref 39.0–52.0)
HCT: 23 % — ABNORMAL LOW (ref 39.0–52.0)
HCT: 35 % — ABNORMAL LOW (ref 39.0–52.0)
HCT: 28 % — ABNORMAL LOW (ref 39.0–52.0)
HCT: 33 % — ABNORMAL LOW (ref 39.0–52.0)
Hemoglobin: 11.6 g/dL — ABNORMAL LOW (ref 13.0–17.0)
Hemoglobin: 7.8 g/dL — ABNORMAL LOW (ref 13.0–17.0)
Hemoglobin: 11.9 g/dL — ABNORMAL LOW (ref 13.0–17.0)
Hemoglobin: 9.5 g/dL — ABNORMAL LOW (ref 13.0–17.0)
Hemoglobin: 11.2 g/dL — ABNORMAL LOW (ref 13.0–17.0)
O2 Saturation: 100 %
O2 Saturation: 100 %
O2 Saturation: 90 %
O2 Saturation: 96 %
O2 Saturation: 97 %
Patient temperature: 37.4
Patient temperature: 37
Patient temperature: 37
Potassium: 4.4 mmol/L (ref 3.5–5.1)
Potassium: 3.8 mmol/L (ref 3.5–5.1)
Potassium: 4.4 mmol/L (ref 3.5–5.1)
Potassium: 4 mmol/L (ref 3.5–5.1)
Potassium: 4.3 mmol/L (ref 3.5–5.1)
Sodium: 138 mmol/L (ref 135–145)
Sodium: 138 mmol/L (ref 135–145)
Sodium: 139 mmol/L (ref 135–145)
Sodium: 140 mmol/L (ref 135–145)
Sodium: 139 mmol/L (ref 135–145)
TCO2: 26 mmol/L (ref 22–32)
TCO2: 24 mmol/L (ref 22–32)
TCO2: 23 mmol/L (ref 22–32)
TCO2: 20 mmol/L — ABNORMAL LOW (ref 22–32)
TCO2: 21 mmol/L — ABNORMAL LOW (ref 22–32)
pCO2 arterial: 42.8 mmHg (ref 32.0–48.0)
pCO2 arterial: 43.7 mmHg (ref 32.0–48.0)
pCO2 arterial: 46 mmHg (ref 32.0–48.0)
pCO2 arterial: 32.7 mmHg (ref 32.0–48.0)
pCO2 arterial: 36.3 mmHg (ref 32.0–48.0)
pH, Arterial: 7.377 (ref 7.350–7.450)
pH, Arterial: 7.332 — ABNORMAL LOW (ref 7.350–7.450)
pH, Arterial: 7.292 — ABNORMAL LOW (ref 7.350–7.450)
pH, Arterial: 7.377 (ref 7.350–7.450)
pH, Arterial: 7.341 — ABNORMAL LOW (ref 7.350–7.450)
pO2, Arterial: 290 mmHg — ABNORMAL HIGH (ref 83.0–108.0)
pO2, Arterial: 255 mmHg — ABNORMAL HIGH (ref 83.0–108.0)
pO2, Arterial: 68 mmHg — ABNORMAL LOW (ref 83.0–108.0)
pO2, Arterial: 80 mmHg — ABNORMAL LOW (ref 83.0–108.0)
pO2, Arterial: 97 mmHg (ref 83.0–108.0)

## 2020-02-19 LAB — POCT I-STAT, CHEM 8
BUN: 21 mg/dL (ref 8–23)
BUN: 20 mg/dL (ref 8–23)
BUN: 18 mg/dL (ref 8–23)
BUN: 19 mg/dL (ref 8–23)
BUN: 17 mg/dL (ref 8–23)
BUN: 16 mg/dL (ref 8–23)
Calcium, Ion: 1.22 mmol/L (ref 1.15–1.40)
Calcium, Ion: 1.19 mmol/L (ref 1.15–1.40)
Calcium, Ion: 1.1 mmol/L — ABNORMAL LOW (ref 1.15–1.40)
Calcium, Ion: 1.12 mmol/L — ABNORMAL LOW (ref 1.15–1.40)
Calcium, Ion: 1.13 mmol/L — ABNORMAL LOW (ref 1.15–1.40)
Calcium, Ion: 1.04 mmol/L — ABNORMAL LOW (ref 1.15–1.40)
Chloride: 103 mmol/L (ref 98–111)
Chloride: 103 mmol/L (ref 98–111)
Chloride: 104 mmol/L (ref 98–111)
Chloride: 104 mmol/L (ref 98–111)
Chloride: 105 mmol/L (ref 98–111)
Chloride: 104 mmol/L (ref 98–111)
Creatinine, Ser: 0.7 mg/dL (ref 0.61–1.24)
Creatinine, Ser: 0.8 mg/dL (ref 0.61–1.24)
Creatinine, Ser: 0.7 mg/dL (ref 0.61–1.24)
Creatinine, Ser: 0.7 mg/dL (ref 0.61–1.24)
Creatinine, Ser: 0.7 mg/dL (ref 0.61–1.24)
Creatinine, Ser: 0.6 mg/dL — ABNORMAL LOW (ref 0.61–1.24)
Glucose, Bld: 109 mg/dL — ABNORMAL HIGH (ref 70–99)
Glucose, Bld: 108 mg/dL — ABNORMAL HIGH (ref 70–99)
Glucose, Bld: 124 mg/dL — ABNORMAL HIGH (ref 70–99)
Glucose, Bld: 140 mg/dL — ABNORMAL HIGH (ref 70–99)
Glucose, Bld: 115 mg/dL — ABNORMAL HIGH (ref 70–99)
Glucose, Bld: 122 mg/dL — ABNORMAL HIGH (ref 70–99)
HCT: 36 % — ABNORMAL LOW (ref 39.0–52.0)
HCT: 33 % — ABNORMAL LOW (ref 39.0–52.0)
HCT: 28 % — ABNORMAL LOW (ref 39.0–52.0)
HCT: 29 % — ABNORMAL LOW (ref 39.0–52.0)
HCT: 29 % — ABNORMAL LOW (ref 39.0–52.0)
HCT: 24 % — ABNORMAL LOW (ref 39.0–52.0)
Hemoglobin: 12.2 g/dL — ABNORMAL LOW (ref 13.0–17.0)
Hemoglobin: 11.2 g/dL — ABNORMAL LOW (ref 13.0–17.0)
Hemoglobin: 9.5 g/dL — ABNORMAL LOW (ref 13.0–17.0)
Hemoglobin: 9.9 g/dL — ABNORMAL LOW (ref 13.0–17.0)
Hemoglobin: 9.9 g/dL — ABNORMAL LOW (ref 13.0–17.0)
Hemoglobin: 8.2 g/dL — ABNORMAL LOW (ref 13.0–17.0)
Potassium: 4.2 mmol/L (ref 3.5–5.1)
Potassium: 4.3 mmol/L (ref 3.5–5.1)
Potassium: 4.2 mmol/L (ref 3.5–5.1)
Potassium: 5.1 mmol/L (ref 3.5–5.1)
Potassium: 4.5 mmol/L (ref 3.5–5.1)
Potassium: 3.8 mmol/L (ref 3.5–5.1)
Sodium: 137 mmol/L (ref 135–145)
Sodium: 137 mmol/L (ref 135–145)
Sodium: 135 mmol/L (ref 135–145)
Sodium: 137 mmol/L (ref 135–145)
Sodium: 138 mmol/L (ref 135–145)
Sodium: 137 mmol/L (ref 135–145)
TCO2: 30 mmol/L (ref 22–32)
TCO2: 26 mmol/L (ref 22–32)
TCO2: 26 mmol/L (ref 22–32)
TCO2: 29 mmol/L (ref 22–32)
TCO2: 26 mmol/L (ref 22–32)
TCO2: 24 mmol/L (ref 22–32)

## 2020-02-19 LAB — CBC
HCT: 37 % — ABNORMAL LOW (ref 39.0–52.0)
HCT: 32.9 % — ABNORMAL LOW (ref 39.0–52.0)
Hemoglobin: 12.4 g/dL — ABNORMAL LOW (ref 13.0–17.0)
Hemoglobin: 11.2 g/dL — ABNORMAL LOW (ref 13.0–17.0)
MCH: 30.2 pg (ref 26.0–34.0)
MCH: 30.3 pg (ref 26.0–34.0)
MCHC: 33.5 g/dL (ref 30.0–36.0)
MCHC: 34 g/dL (ref 30.0–36.0)
MCV: 90.2 fL (ref 80.0–100.0)
MCV: 88.9 fL (ref 80.0–100.0)
Platelets: 129 10*3/uL — ABNORMAL LOW (ref 150–400)
Platelets: UNDETERMINED 10*3/uL (ref 150–400)
RBC: 4.1 MIL/uL — ABNORMAL LOW (ref 4.22–5.81)
RBC: 3.7 MIL/uL — ABNORMAL LOW (ref 4.22–5.81)
RDW: 13.1 % (ref 11.5–15.5)
RDW: 13.2 % (ref 11.5–15.5)
WBC: 20 10*3/uL — ABNORMAL HIGH (ref 4.0–10.5)
WBC: 17.3 10*3/uL — ABNORMAL HIGH (ref 4.0–10.5)
nRBC: 0 % (ref 0.0–0.2)
nRBC: 0 % (ref 0.0–0.2)

## 2020-02-19 LAB — PROTIME-INR
INR: 1.1 (ref 0.8–1.2)
INR: 1.5 — ABNORMAL HIGH (ref 0.8–1.2)
Prothrombin Time: 14.1 seconds (ref 11.4–15.2)
Prothrombin Time: 18.3 seconds — ABNORMAL HIGH (ref 11.4–15.2)

## 2020-02-19 LAB — BASIC METABOLIC PANEL
Anion gap: 10 (ref 5–15)
BUN: 16 mg/dL (ref 8–23)
CO2: 17 mmol/L — ABNORMAL LOW (ref 22–32)
Calcium: 7.1 mg/dL — ABNORMAL LOW (ref 8.9–10.3)
Chloride: 109 mmol/L (ref 98–111)
Creatinine, Ser: 0.82 mg/dL (ref 0.61–1.24)
GFR calc Af Amer: >60 mL/min (ref >60)
GFR calc non Af Amer: >60 mL/min (ref >60)
Glucose, Bld: 158 mg/dL — ABNORMAL HIGH (ref 70–99)
Potassium: 4.3 mmol/L (ref 3.5–5.1)
Sodium: 136 mmol/L (ref 135–145)

## 2020-02-19 LAB — HEMOGLOBIN AND HEMATOCRIT, BLOOD
HCT: 28.4 % — ABNORMAL LOW (ref 39.0–52.0)
Hemoglobin: 9.8 g/dL — ABNORMAL LOW (ref 13.0–17.0)

## 2020-02-19 LAB — APTT
aPTT: 28 seconds (ref 24–36)
aPTT: 29 seconds (ref 24–36)

## 2020-02-19 LAB — GLUCOSE, CAPILLARY
Glucose-Capillary: 136 mg/dL — ABNORMAL HIGH (ref 70–99)
Glucose-Capillary: 129 mg/dL — ABNORMAL HIGH (ref 70–99)
Glucose-Capillary: 109 mg/dL — ABNORMAL HIGH (ref 70–99)
Glucose-Capillary: 105 mg/dL — ABNORMAL HIGH (ref 70–99)
Glucose-Capillary: 88 mg/dL (ref 70–99)
Glucose-Capillary: 137 mg/dL — ABNORMAL HIGH (ref 70–99)
Glucose-Capillary: 149 mg/dL — ABNORMAL HIGH (ref 70–99)
Glucose-Capillary: 149 mg/dL — ABNORMAL HIGH (ref 70–99)

## 2020-02-19 LAB — ECHO INTRAOPERATIVE TEE
Height: 70 in
Weight: 3040 oz

## 2020-02-19 LAB — MAGNESIUM: Magnesium: 3 mg/dL — ABNORMAL HIGH (ref 1.7–2.4)

## 2020-02-19 LAB — PLATELET COUNT: Platelets: 106 10*3/uL — ABNORMAL LOW (ref 150–400)

## 2020-02-19 LAB — PREPARE RBC (CROSSMATCH)

## 2020-02-19 SURGERY — REPAIR, MITRAL VALVE
Anesthesia: General | Site: Chest

## 2020-02-19 MED ORDER — ROCURONIUM BROMIDE 10 MG/ML (PF) SYRINGE
PREFILLED_SYRINGE | INTRAVENOUS | Status: DC | PRN
  Administered 2020-02-19: 50 mg via INTRAVENOUS
  Administered 2020-02-19: 20 mg via INTRAVENOUS
  Administered 2020-02-19 (×3): 40 mg via INTRAVENOUS
  Administered 2020-02-19: 20 mg via INTRAVENOUS
  Administered 2020-02-19: 60 mg via INTRAVENOUS
  Administered 2020-02-19: 20 mg via INTRAVENOUS

## 2020-02-19 MED ORDER — SODIUM CHLORIDE (PF) 0.9 % IJ SOLN
OROMUCOSAL | Status: DC | PRN
  Administered 2020-02-19 (×4): 4 mL via TOPICAL

## 2020-02-19 MED ORDER — SODIUM CHLORIDE 0.45 % IV SOLN: INTRAVENOUS | Status: DC | PRN

## 2020-02-19 MED ORDER — SODIUM CHLORIDE 0.9 % IR SOLN
Status: DC | PRN
  Administered 2020-02-19: 1000 mL

## 2020-02-19 MED ORDER — INSULIN REGULAR(HUMAN) IN NACL 100-0.9 UT/100ML-% IV SOLN
INTRAVENOUS | Status: DC
  Administered 2020-02-19: 1.5 [IU]/h via INTRAVENOUS

## 2020-02-19 MED ORDER — SODIUM CHLORIDE 0.9 % IV SOLN
1.5000 g | Freq: Two times a day (BID) | INTRAVENOUS | Status: AC
  Administered 2020-02-19 – 2020-02-21 (×4): 1.5 g via INTRAVENOUS
  Filled 2020-02-19 (×5): qty 1.5

## 2020-02-19 MED ORDER — SODIUM CHLORIDE 0.9 % IV SOLN: 250.0000 mL | INTRAVENOUS | Status: DC

## 2020-02-19 MED ORDER — ACETAMINOPHEN 160 MG/5ML PO SOLN: 650.0000 mg | Freq: Once | ORAL | Status: AC

## 2020-02-19 MED ORDER — ALBUMIN HUMAN 5 % IV SOLN
INTRAVENOUS | Status: AC
  Administered 2020-02-19: 12.5 g via INTRAVENOUS
  Filled 2020-02-19: qty 500

## 2020-02-19 MED ORDER — POTASSIUM CHLORIDE 10 MEQ/50ML IV SOLN
INTRAVENOUS | Status: AC
  Filled 2020-02-19: qty 150

## 2020-02-19 MED ORDER — FENTANYL CITRATE (PF) 100 MCG/2ML IJ SOLN
INTRAMUSCULAR | Status: DC | PRN
  Administered 2020-02-19: 150 ug via INTRAVENOUS
  Administered 2020-02-19 (×2): 100 ug via INTRAVENOUS
  Administered 2020-02-19: 150 ug via INTRAVENOUS
  Administered 2020-02-19: 100 ug via INTRAVENOUS
  Administered 2020-02-19: 250 ug via INTRAVENOUS

## 2020-02-19 MED ORDER — DEXMEDETOMIDINE HCL IN NACL 400 MCG/100ML IV SOLN
0.0000 ug/kg/h | INTRAVENOUS | Status: DC
  Administered 2020-02-19: 0.032 ug/kg/h via INTRAVENOUS
  Administered 2020-02-19: 0.2 ug/kg/h via INTRAVENOUS
  Filled 2020-02-19: qty 100

## 2020-02-19 MED ORDER — NITROGLYCERIN IN D5W 200-5 MCG/ML-% IV SOLN
0.0000 ug/min | INTRAVENOUS | Status: DC
  Administered 2020-02-19: 0 ug/min via INTRAVENOUS

## 2020-02-19 MED ORDER — ORAL CARE MOUTH RINSE
15.0000 mL | Freq: Two times a day (BID) | OROMUCOSAL | Status: DC
  Administered 2020-02-20 – 2020-02-24 (×4): 15 mL via OROMUCOSAL

## 2020-02-19 MED ORDER — SODIUM CHLORIDE 0.9 % IR SOLN
Status: DC | PRN
  Administered 2020-02-19: 3000 mL
  Administered 2020-02-19: 5000 mL

## 2020-02-19 MED ORDER — ACETAMINOPHEN 650 MG RE SUPP: 650.0000 mg | Freq: Once | RECTAL | Status: AC

## 2020-02-19 MED ORDER — FAMOTIDINE IN NACL 20-0.9 MG/50ML-% IV SOLN
INTRAVENOUS | Status: AC
  Administered 2020-02-19: 20 mg
  Filled 2020-02-19: qty 50

## 2020-02-19 MED ORDER — CHLORHEXIDINE GLUCONATE 0.12 % MT SOLN
15.0000 mL | OROMUCOSAL | Status: AC
  Administered 2020-02-19: 15 mL via OROMUCOSAL

## 2020-02-19 MED ORDER — DOCUSATE SODIUM 100 MG PO CAPS
200.0000 mg | ORAL_CAPSULE | Freq: Every day | ORAL | Status: DC
  Administered 2020-02-20 – 2020-02-23 (×4): 200 mg via ORAL
  Filled 2020-02-19 (×4): qty 2

## 2020-02-19 MED ORDER — PROTAMINE SULFATE 10 MG/ML IV SOLN
INTRAVENOUS | Status: AC
  Filled 2020-02-19: qty 25

## 2020-02-19 MED ORDER — BISACODYL 10 MG RE SUPP: 10.0000 mg | Freq: Every day | RECTAL | Status: DC

## 2020-02-19 MED ORDER — OXYCODONE HCL 5 MG PO TABS
5.0000 mg | ORAL_TABLET | ORAL | Status: DC | PRN
  Administered 2020-02-19 – 2020-02-22 (×11): 10 mg via ORAL
  Filled 2020-02-19 (×11): qty 2

## 2020-02-19 MED ORDER — LACTATED RINGERS IV SOLN: INTRAVENOUS | Status: DC

## 2020-02-19 MED ORDER — PHENYLEPHRINE HCL-NACL 20-0.9 MG/250ML-% IV SOLN
0.0000 ug/min | INTRAVENOUS | Status: DC
  Administered 2020-02-19: 45 ug/min via INTRAVENOUS
  Administered 2020-02-19: 65 ug/min via INTRAVENOUS
  Administered 2020-02-19: 60 ug/min via INTRAVENOUS
  Filled 2020-02-19 (×2): qty 250

## 2020-02-19 MED ORDER — MIDAZOLAM HCL (PF) 10 MG/2ML IJ SOLN
INTRAMUSCULAR | Status: AC
  Filled 2020-02-19: qty 2

## 2020-02-19 MED ORDER — MIDAZOLAM HCL 5 MG/5ML IJ SOLN
INTRAMUSCULAR | Status: DC | PRN
  Administered 2020-02-19 (×3): 2 mg via INTRAVENOUS
  Administered 2020-02-19: 4 mg via INTRAVENOUS

## 2020-02-19 MED ORDER — ROCURONIUM BROMIDE 10 MG/ML (PF) SYRINGE
PREFILLED_SYRINGE | INTRAVENOUS | Status: AC
  Filled 2020-02-19: qty 10

## 2020-02-19 MED ORDER — DEXTROSE 50 % IV SOLN: 0.0000 mL | INTRAVENOUS | Status: DC | PRN

## 2020-02-19 MED ORDER — BISACODYL 5 MG PO TBEC
10.0000 mg | DELAYED_RELEASE_TABLET | Freq: Every day | ORAL | Status: DC
  Administered 2020-02-20 – 2020-02-23 (×4): 10 mg via ORAL
  Filled 2020-02-19 (×4): qty 2

## 2020-02-19 MED ORDER — ONDANSETRON HCL 4 MG/2ML IJ SOLN
4.0000 mg | Freq: Four times a day (QID) | INTRAMUSCULAR | Status: DC | PRN
  Administered 2020-02-21 – 2020-02-23 (×3): 4 mg via INTRAVENOUS
  Filled 2020-02-19 (×3): qty 2

## 2020-02-19 MED ORDER — ACETAMINOPHEN 160 MG/5ML PO SOLN: 1000.0000 mg | Freq: Four times a day (QID) | ORAL | Status: DC

## 2020-02-19 MED ORDER — PROPOFOL 10 MG/ML IV BOLUS
INTRAVENOUS | Status: AC
  Filled 2020-02-19: qty 20

## 2020-02-19 MED ORDER — PROTAMINE SULFATE 10 MG/ML IV SOLN
INTRAVENOUS | Status: AC
  Filled 2020-02-19: qty 5

## 2020-02-19 MED ORDER — VANCOMYCIN HCL IN DEXTROSE 1-5 GM/200ML-% IV SOLN
1000.0000 mg | Freq: Once | INTRAVENOUS | Status: AC
  Administered 2020-02-19: 1000 mg via INTRAVENOUS
  Filled 2020-02-19: qty 200

## 2020-02-19 MED ORDER — MAGNESIUM SULFATE 4 GM/100ML IV SOLN
4.0000 g | Freq: Once | INTRAVENOUS | Status: AC
  Administered 2020-02-19: 4 g via INTRAVENOUS
  Filled 2020-02-19: qty 100

## 2020-02-19 MED ORDER — ASPIRIN 81 MG PO CHEW: 324.0000 mg | CHEWABLE_TABLET | Freq: Every day | ORAL | Status: DC

## 2020-02-19 MED ORDER — METOPROLOL TARTRATE 12.5 MG HALF TABLET
12.5000 mg | ORAL_TABLET | Freq: Once | ORAL | Status: AC
  Administered 2020-02-19: 12.5 mg via ORAL
  Filled 2020-02-19: qty 1

## 2020-02-19 MED ORDER — MORPHINE SULFATE (PF) 2 MG/ML IV SOLN
1.0000 mg | INTRAVENOUS | Status: DC | PRN
  Administered 2020-02-19 (×2): 2 mg via INTRAVENOUS
  Administered 2020-02-19: 4 mg via INTRAVENOUS
  Administered 2020-02-19 – 2020-02-21 (×6): 2 mg via INTRAVENOUS
  Administered 2020-02-21: 3 mg via INTRAVENOUS
  Filled 2020-02-19 (×2): qty 1
  Filled 2020-02-19 (×2): qty 2
  Filled 2020-02-19 (×3): qty 1
  Filled 2020-02-19: qty 2
  Filled 2020-02-19 (×4): qty 1

## 2020-02-19 MED ORDER — ASPIRIN EC 325 MG PO TBEC: 325.0000 mg | DELAYED_RELEASE_TABLET | Freq: Every day | ORAL | Status: DC

## 2020-02-19 MED ORDER — VANCOMYCIN HCL 1000 MG IV SOLR
INTRAVENOUS | Status: DC | PRN
  Administered 2020-02-19: 1000 mL

## 2020-02-19 MED ORDER — ACETAMINOPHEN 650 MG RE SUPP
RECTAL | Status: AC
  Administered 2020-02-19: 650 mg via RECTAL
  Filled 2020-02-19: qty 1

## 2020-02-19 MED ORDER — CHLORHEXIDINE GLUCONATE CLOTH 2 % EX PADS
6.0000 | MEDICATED_PAD | Freq: Every day | CUTANEOUS | Status: DC
  Administered 2020-02-20 – 2020-02-21 (×3): 6 via TOPICAL

## 2020-02-19 MED ORDER — SODIUM CHLORIDE 0.9% FLUSH
10.0000 mL | Freq: Two times a day (BID) | INTRAVENOUS | Status: DC
  Administered 2020-02-20: 10 mL
  Administered 2020-02-20: 30 mL
  Administered 2020-02-21: 20 mL

## 2020-02-19 MED ORDER — SODIUM CHLORIDE 0.9 % IV SOLN: INTRAVENOUS | Status: DC

## 2020-02-19 MED ORDER — SODIUM CHLORIDE 0.9% FLUSH: 10.0000 mL | INTRAVENOUS | Status: DC | PRN

## 2020-02-19 MED ORDER — CHLORHEXIDINE GLUCONATE 4 % EX LIQD: 30.0000 mL | CUTANEOUS | Status: DC

## 2020-02-19 MED ORDER — LACTATED RINGERS IV SOLN: INTRAVENOUS | Status: DC | PRN

## 2020-02-19 MED ORDER — METOPROLOL TARTRATE 5 MG/5ML IV SOLN: 2.5000 mg | INTRAVENOUS | Status: DC | PRN

## 2020-02-19 MED ORDER — ALBUMIN HUMAN 5 % IV SOLN
250.0000 mL | INTRAVENOUS | Status: AC | PRN
  Administered 2020-02-19 (×2): 12.5 g via INTRAVENOUS
  Filled 2020-02-19: qty 250

## 2020-02-19 MED ORDER — ACETAMINOPHEN 500 MG PO TABS
1000.0000 mg | ORAL_TABLET | Freq: Four times a day (QID) | ORAL | Status: DC
  Administered 2020-02-19 – 2020-02-24 (×17): 1000 mg via ORAL
  Filled 2020-02-19 (×18): qty 2

## 2020-02-19 MED ORDER — PLASMA-LYTE 148 IV SOLN: INTRAVENOUS | Status: DC | PRN

## 2020-02-19 MED ORDER — PROPOFOL 10 MG/ML IV BOLUS
INTRAVENOUS | Status: DC | PRN
  Administered 2020-02-19: 80 mg via INTRAVENOUS

## 2020-02-19 MED ORDER — FAMOTIDINE IN NACL 20-0.9 MG/50ML-% IV SOLN: 20.0000 mg | Freq: Two times a day (BID) | INTRAVENOUS | Status: DC

## 2020-02-19 MED ORDER — PHENYLEPHRINE HCL-NACL 10-0.9 MG/250ML-% IV SOLN
INTRAVENOUS | Status: DC | PRN
  Administered 2020-02-19: 20 ug/min via INTRAVENOUS

## 2020-02-19 MED ORDER — HEPARIN SODIUM (PORCINE) 1000 UNIT/ML IJ SOLN
INTRAMUSCULAR | Status: AC
  Filled 2020-02-19: qty 1

## 2020-02-19 MED ORDER — FENTANYL CITRATE (PF) 250 MCG/5ML IJ SOLN
INTRAMUSCULAR | Status: AC
  Filled 2020-02-19: qty 25

## 2020-02-19 MED ORDER — SODIUM CHLORIDE 0.9% FLUSH: 3.0000 mL | INTRAVENOUS | Status: DC | PRN

## 2020-02-19 MED ORDER — PROTAMINE SULFATE 10 MG/ML IV SOLN
INTRAVENOUS | Status: DC | PRN
  Administered 2020-02-19: 240 mg via INTRAVENOUS
  Administered 2020-02-19: 10 mg via INTRAVENOUS

## 2020-02-19 MED ORDER — SODIUM CHLORIDE 0.9% FLUSH
3.0000 mL | Freq: Two times a day (BID) | INTRAVENOUS | Status: DC
  Administered 2020-02-20 – 2020-02-21 (×4): 3 mL via INTRAVENOUS

## 2020-02-19 MED ORDER — MILRINONE LACTATE IN DEXTROSE 20-5 MG/100ML-% IV SOLN
0.0000 ug/kg/min | INTRAVENOUS | Status: DC
  Administered 2020-02-20: 0.25 ug/kg/min via INTRAVENOUS
  Filled 2020-02-19: qty 100

## 2020-02-19 MED ORDER — PHENYLEPHRINE 40 MCG/ML (10ML) SYRINGE FOR IV PUSH (FOR BLOOD PRESSURE SUPPORT)
PREFILLED_SYRINGE | INTRAVENOUS | Status: AC
  Filled 2020-02-19: qty 10

## 2020-02-19 MED ORDER — POTASSIUM CHLORIDE 10 MEQ/50ML IV SOLN: 10.0000 meq | INTRAVENOUS | Status: AC

## 2020-02-19 MED ORDER — MIDAZOLAM HCL 2 MG/2ML IJ SOLN: 2.0000 mg | INTRAMUSCULAR | Status: DC | PRN

## 2020-02-19 MED ORDER — CHLORHEXIDINE GLUCONATE 0.12 % MT SOLN
15.0000 mL | Freq: Once | OROMUCOSAL | Status: AC
  Administered 2020-02-19: 15 mL via OROMUCOSAL
  Filled 2020-02-19: qty 15

## 2020-02-19 MED ORDER — PANTOPRAZOLE SODIUM 40 MG PO TBEC
40.0000 mg | DELAYED_RELEASE_TABLET | Freq: Every day | ORAL | Status: DC
  Administered 2020-02-21 – 2020-02-24 (×4): 40 mg via ORAL
  Filled 2020-02-19 (×4): qty 1

## 2020-02-19 MED ORDER — MILRINONE LACTATE IN DEXTROSE 20-5 MG/100ML-% IV SOLN: 0.2500 ug/kg/min | INTRAVENOUS | Status: AC

## 2020-02-19 MED ORDER — TRAMADOL HCL 50 MG PO TABS
50.0000 mg | ORAL_TABLET | ORAL | Status: DC | PRN
  Administered 2020-02-20 – 2020-02-21 (×4): 100 mg via ORAL
  Filled 2020-02-19 (×4): qty 2

## 2020-02-19 MED ORDER — HEPARIN SODIUM (PORCINE) 1000 UNIT/ML IJ SOLN
INTRAMUSCULAR | Status: DC | PRN
  Administered 2020-02-19: 27000 [IU] via INTRAVENOUS

## 2020-02-19 MED ORDER — LACTATED RINGERS IV SOLN: 500.0000 mL | Freq: Once | INTRAVENOUS | Status: DC | PRN

## 2020-02-19 MED ORDER — SODIUM CHLORIDE 0.9% IV SOLUTION: Freq: Once | INTRAVENOUS | Status: DC

## 2020-02-19 SURGICAL SUPPLY — 174 items
ADAPTER CARDIO PERF ANTE/RETRO (ADAPTER) ×3 IMPLANT
ADH SKN CLS LQ APL DERMABOND (GAUZE/BANDAGES/DRESSINGS) ×2
ADPR PRFSN 84XANTGRD RTRGD (ADAPTER) ×2
APL SWBSTK 6 STRL LF DISP (MISCELLANEOUS)
APPLICATOR COTTON TIP 6 STRL (MISCELLANEOUS) IMPLANT
APPLICATOR COTTON TIP 6IN STRL (MISCELLANEOUS)
BAG DECANTER FOR FLEXI CONT (MISCELLANEOUS) ×9 IMPLANT
BASKET HEART (ORDER IN 25'S) (MISCELLANEOUS)
BASKET HEART (ORDER IN 25S) (MISCELLANEOUS) ×2 IMPLANT
BLADE CLIPPER SURG (BLADE) ×6 IMPLANT
BLADE STERNUM SYSTEM 6 (BLADE) ×6 IMPLANT
BLADE SURG 11 STRL SS (BLADE) ×4 IMPLANT
BNDG ELASTIC 4X5.8 VLCR STR LF (GAUZE/BANDAGES/DRESSINGS) ×2 IMPLANT
BNDG ELASTIC 6X5.8 VLCR STR LF (GAUZE/BANDAGES/DRESSINGS) ×2 IMPLANT
BNDG GAUZE ELAST 4 BULKY (GAUZE/BANDAGES/DRESSINGS) ×2 IMPLANT
CANISTER SUCT 3000ML PPV (MISCELLANEOUS) ×6 IMPLANT
CANN PRFSN 3/8X14X24FR PCFC (MISCELLANEOUS)
CANN PRFSN 3/8XCNCT ST RT ANG (MISCELLANEOUS)
CANNULA EZ GLIDE AORTIC 21FR (CANNULA) ×7 IMPLANT
CANNULA FEM VENOUS REMOTE 22FR (CANNULA) ×1 IMPLANT
CANNULA GUNDRY RCSP 15FR (MISCELLANEOUS) ×3 IMPLANT
CANNULA PRFSN 3/8X14X24FR PCFC (MISCELLANEOUS) IMPLANT
CANNULA PRFSN 3/8XCNCT RT ANG (MISCELLANEOUS) IMPLANT
CANNULA SUMP PERICARDIAL (CANNULA) ×3 IMPLANT
CANNULA VEN MTL TIP RT (MISCELLANEOUS)
CANNULA VENNOUS METAL TIP 20FR (CANNULA) ×1 IMPLANT
CATH CPB KIT OWEN (MISCELLANEOUS) ×3 IMPLANT
CATH FOLEY 2WAY SLVR  5CC 14FR (CATHETERS)
CATH FOLEY 2WAY SLVR 5CC 14FR (CATHETERS) IMPLANT
CATH ROBINSON RED A/P 18FR (CATHETERS) ×1 IMPLANT
CATH THORACIC 28FR RT ANG (CATHETERS) IMPLANT
CATH THORACIC 36FR (CATHETERS) ×3 IMPLANT
CLAMP ISOLATOR SYNERGY LG (MISCELLANEOUS) ×2 IMPLANT
CLAMP OLL ABLATION (MISCELLANEOUS) ×1 IMPLANT
CLIP FOGARTY SPRING 6M (CLIP) IMPLANT
CLIP RETRACTION 3.0MM CORONARY (MISCELLANEOUS) ×3 IMPLANT
CLIP VESOCCLUDE MED 24/CT (CLIP) IMPLANT
CLIP VESOCCLUDE SM WIDE 24/CT (CLIP) IMPLANT
CNTNR URN SCR LID CUP LEK RST (MISCELLANEOUS) IMPLANT
CONN 1/2X1/2X1/2  BEN (MISCELLANEOUS) ×6
CONN 1/2X1/2X1/2 BEN (MISCELLANEOUS) ×2 IMPLANT
CONN 3/8X1/2 ST GISH (MISCELLANEOUS) ×6 IMPLANT
CONN ST 1/4X3/8  BEN (MISCELLANEOUS) ×3
CONN ST 1/4X3/8 BEN (MISCELLANEOUS) IMPLANT
CONT SPEC 4OZ CLIKSEAL STRL BL (MISCELLANEOUS) ×1 IMPLANT
CONT SPEC 4OZ STRL OR WHT (MISCELLANEOUS) ×3
COVER PROBE W GEL 5X96 (DRAPES) ×1 IMPLANT
COVER SURGICAL LIGHT HANDLE (MISCELLANEOUS) ×6 IMPLANT
DERMABOND ADHESIVE PROPEN (GAUZE/BANDAGES/DRESSINGS) ×1
DERMABOND ADVANCED .7 DNX6 (GAUZE/BANDAGES/DRESSINGS) IMPLANT
DEVICE CLOSURE PERCLS PRGLD 6F (VASCULAR PRODUCTS) IMPLANT
DEVICE SUT CK QUICK LOAD INDV (Prosthesis & Implant Heart) ×3 IMPLANT
DEVICE SUT CK QUICK LOAD MINI (Prosthesis & Implant Heart) ×2 IMPLANT
DRAIN CHANNEL 32F RND 10.7 FF (WOUND CARE) ×9 IMPLANT
DRAPE CARDIOVASCULAR INCISE (DRAPES) ×6
DRAPE CV SPLIT W-CLR ANES SCRN (DRAPES) IMPLANT
DRAPE INCISE IOBAN 66X45 STRL (DRAPES) ×7 IMPLANT
DRAPE PERI GROIN 82X75IN TIB (DRAPES) IMPLANT
DRAPE SLUSH/WARMER DISC (DRAPES) ×6 IMPLANT
DRAPE SRG 135X102X78XABS (DRAPES) ×2 IMPLANT
DRSG AQUACEL AG ADV 3.5X14 (GAUZE/BANDAGES/DRESSINGS) ×5 IMPLANT
ELECT BLADE 4.0 EZ CLEAN MEGAD (MISCELLANEOUS) ×6
ELECT REM PT RETURN 9FT ADLT (ELECTROSURGICAL) ×6
ELECTRODE BLDE 4.0 EZ CLN MEGD (MISCELLANEOUS) ×2 IMPLANT
ELECTRODE REM PT RTRN 9FT ADLT (ELECTROSURGICAL) ×8 IMPLANT
FELT TEFLON 1X6 (MISCELLANEOUS) ×12 IMPLANT
FIBERTAPE STERNAL CLSR 2 36IN (Sternal Fixation) ×2 IMPLANT
FIBERTAPE STERNAL CLSR 2X36 (Sternal Fixation) ×3 IMPLANT
GAUZE SPONGE 4X4 12PLY STRL (GAUZE/BANDAGES/DRESSINGS) ×9 IMPLANT
GLOVE BIO SURGEON STRL SZ 6 (GLOVE) IMPLANT
GLOVE BIO SURGEON STRL SZ 6.5 (GLOVE) ×6 IMPLANT
GLOVE BIO SURGEON STRL SZ7 (GLOVE) ×2 IMPLANT
GLOVE BIO SURGEON STRL SZ7.5 (GLOVE) IMPLANT
GLOVE BIOGEL PI IND STRL 6.5 (GLOVE) IMPLANT
GLOVE BIOGEL PI INDICATOR 6.5 (GLOVE) ×1
GLOVE ORTHO TXT STRL SZ7.5 (GLOVE) ×6 IMPLANT
GLOVE SURG SS PI 6.0 STRL IVOR (GLOVE) ×1 IMPLANT
GOWN STRL REUS W/ TWL LRG LVL3 (GOWN DISPOSABLE) ×16 IMPLANT
GOWN STRL REUS W/TWL LRG LVL3 (GOWN DISPOSABLE) ×27
HEMOSTAT POWDER SURGIFOAM 1G (HEMOSTASIS) ×16 IMPLANT
INSERT FOGARTY XLG (MISCELLANEOUS) ×6 IMPLANT
IV NS 1000ML (IV SOLUTION) ×3
IV NS 1000ML BAXH (IV SOLUTION) IMPLANT
IV NS IRRIG 3000ML ARTHROMATIC (IV SOLUTION) ×1 IMPLANT
KIT BASIN OR (CUSTOM PROCEDURE TRAY) ×5 IMPLANT
KIT DRAINAGE VACCUM ASSIST (KITS) ×1 IMPLANT
KIT SUCTION CATH 14FR (SUCTIONS) ×16 IMPLANT
KIT SUT CK MINI COMBO 4X17 (Prosthesis & Implant Heart) ×1 IMPLANT
KIT TURNOVER KIT B (KITS) ×5 IMPLANT
KIT VASOVIEW HEMOPRO 2 VH 4000 (KITS) ×2 IMPLANT
LEAD PACING MYOCARDI (MISCELLANEOUS) ×3 IMPLANT
LINE VENT (MISCELLANEOUS) ×1 IMPLANT
LOOP VESSEL SUPERMAXI WHITE (MISCELLANEOUS) ×4 IMPLANT
MARKER GRAFT CORONARY BYPASS (MISCELLANEOUS) ×6 IMPLANT
NDL SUT PASSING CERCLAG MED (SUTURE) IMPLANT
NDL SUT PASSING CERCLAGE MED (SUTURE) ×3
NEEDLE SUT PASSING CERCLAG MED (SUTURE) ×2 IMPLANT
NS IRRIG 1000ML POUR BTL (IV SOLUTION) ×30 IMPLANT
PACK E OPEN HEART (SUTURE) ×3 IMPLANT
PACK OPEN HEART (CUSTOM PROCEDURE TRAY) ×5 IMPLANT
PAD ARMBOARD 7.5X6 YLW CONV (MISCELLANEOUS) ×10 IMPLANT
PAD ELECT DEFIB RADIOL ZOLL (MISCELLANEOUS) ×3 IMPLANT
PENCIL BUTTON HOLSTER BLD 10FT (ELECTRODE) ×2 IMPLANT
PERCLOSE PROGLIDE 6F (VASCULAR PRODUCTS) ×6
POSITIONER HEAD DONUT 9IN (MISCELLANEOUS) ×5 IMPLANT
PROBE CRYO2-ABLATION MALLABLE (MISCELLANEOUS) ×1 IMPLANT
PUNCH AORTIC ROTATE 4.0MM (MISCELLANEOUS) IMPLANT
PUNCH AORTIC ROTATE 4.5MM 8IN (MISCELLANEOUS) IMPLANT
PUNCH AORTIC ROTATE 5MM 8IN (MISCELLANEOUS) IMPLANT
RING MITRAL MEMO 4D 34 (Prosthesis & Implant Heart) ×1 IMPLANT
SET CARDIOPLEGIA MPS 5001102 (MISCELLANEOUS) ×1 IMPLANT
SET IRRIG TUBING LAPAROSCOPIC (IRRIGATION / IRRIGATOR) ×3 IMPLANT
SHEATH PINNACLE 8F 10CM (SHEATH) ×1 IMPLANT
SOL ANTI FOG 6CC (MISCELLANEOUS) IMPLANT
SOLUTION ANTI FOG 6CC (MISCELLANEOUS)
SPONGE LAP 18X18 RF (DISPOSABLE) IMPLANT
SPONGE LAP 18X18 X RAY DECT (DISPOSABLE) ×1 IMPLANT
SPONGE LAP 4X18 RFD (DISPOSABLE) IMPLANT
SUT BONE WAX W31G (SUTURE) ×6 IMPLANT
SUT ETHIBOND (SUTURE) ×2 IMPLANT
SUT ETHIBOND 2 0 SH (SUTURE) ×8 IMPLANT
SUT ETHIBOND 2 0 SH 36X2 (SUTURE) ×4 IMPLANT
SUT ETHIBOND 2 0 V4 (SUTURE) IMPLANT
SUT ETHIBOND 2 0V4 GREEN (SUTURE) IMPLANT
SUT ETHIBOND 2-0 RB-1 WHT (SUTURE) ×2 IMPLANT
SUT ETHIBOND 4 0 TF (SUTURE) IMPLANT
SUT ETHIBOND 5 0 C 1 30 (SUTURE) ×3 IMPLANT
SUT ETHIBOND X763 2 0 SH 1 (SUTURE) ×13 IMPLANT
SUT MNCRL AB 3-0 PS2 18 (SUTURE) ×13 IMPLANT
SUT MNCRL AB 4-0 PS2 18 (SUTURE) IMPLANT
SUT PDS AB 1 CTX 36 (SUTURE) ×12 IMPLANT
SUT PROLENE 2 0 SH DA (SUTURE) IMPLANT
SUT PROLENE 3 0 SH 1 (SUTURE) ×3 IMPLANT
SUT PROLENE 3 0 SH DA (SUTURE) ×7 IMPLANT
SUT PROLENE 3 0 SH1 36 (SUTURE) ×2 IMPLANT
SUT PROLENE 4 0 RB 1 (SUTURE) ×36
SUT PROLENE 4 0 SH DA (SUTURE) ×6 IMPLANT
SUT PROLENE 4-0 RB1 .5 CRCL 36 (SUTURE) ×4 IMPLANT
SUT PROLENE 5 0 C 1 36 (SUTURE) IMPLANT
SUT PROLENE 6 0 C 1 30 (SUTURE) ×3 IMPLANT
SUT PROLENE 7.0 RB 3 (SUTURE) ×6 IMPLANT
SUT PROLENE 8 0 BV175 6 (SUTURE) ×1 IMPLANT
SUT PROLENE BLUE 7 0 (SUTURE) ×2 IMPLANT
SUT PROLENE POLY MONO (SUTURE) ×2 IMPLANT
SUT PTFE CHORD X 16MM (SUTURE) ×1 IMPLANT
SUT PTFE CHORD X 20MM (SUTURE) ×1 IMPLANT
SUT SILK  1 MH (SUTURE) ×9
SUT SILK 1 MH (SUTURE) ×4 IMPLANT
SUT STEEL 6MS V (SUTURE) IMPLANT
SUT STEEL STERNAL CCS#1 18IN (SUTURE) IMPLANT
SUT STEEL SZ 6 DBL 3X14 BALL (SUTURE) IMPLANT
SUT VIC AB 1 CTX 36 (SUTURE)
SUT VIC AB 1 CTX36XBRD ANBCTR (SUTURE) IMPLANT
SUT VIC AB 2-0 CT1 27 (SUTURE)
SUT VIC AB 2-0 CT1 TAPERPNT 27 (SUTURE) IMPLANT
SUT VIC AB 2-0 CTX 27 (SUTURE) IMPLANT
SUT VIC AB 3-0 SH 27 (SUTURE)
SUT VIC AB 3-0 SH 27X BRD (SUTURE) IMPLANT
SUT VIC AB 3-0 X1 27 (SUTURE) IMPLANT
SUT VICRYL 4-0 PS2 18IN ABS (SUTURE) IMPLANT
SWAB COLLECTION DEVICE MRSA (MISCELLANEOUS) ×1 IMPLANT
SWAB CULTURE LIQUID MINI MALE (MISCELLANEOUS) ×1 IMPLANT
SYR BULB IRRIGATION 50ML (SYRINGE) ×1 IMPLANT
SYSTEM SAHARA CHEST DRAIN ATS (WOUND CARE) ×5 IMPLANT
TAPE CLOTH SURG 4X10 WHT LF (GAUZE/BANDAGES/DRESSINGS) ×1 IMPLANT
TAPE PAPER 2X10 WHT MICROPORE (GAUZE/BANDAGES/DRESSINGS) ×1 IMPLANT
TOWEL GREEN STERILE (TOWEL DISPOSABLE) ×5 IMPLANT
TOWEL GREEN STERILE FF (TOWEL DISPOSABLE) ×5 IMPLANT
TRAY FOLEY SLVR 14FR TEMP STAT (SET/KITS/TRAYS/PACK) ×2 IMPLANT
TRAY FOLEY SLVR 16FR TEMP STAT (SET/KITS/TRAYS/PACK) ×5 IMPLANT
TUBING LAP HI FLOW INSUFFLATIO (TUBING) ×4 IMPLANT
UNDERPAD 30X30 (UNDERPADS AND DIAPERS) ×5 IMPLANT
WATER STERILE IRR 1000ML POUR (IV SOLUTION) ×10 IMPLANT
WIRE EMERALD 3MM-J .035X150CM (WIRE) ×1 IMPLANT

## 2020-02-19 NOTE — Progress Notes (Signed)
TCTS BRIEF SICU PROGRESS NOTE  Day of Surgery  S/P Procedure(s) (LRB): MITRAL VALVE REPAIR (MVR) USING MEMO 4D RING SIZE 34MM (N/A) CORONARY ARTERY BYPASS GRAFTING (CABG) x ONE, USING LEFT INTERNAL MAMMARY ARTERY (N/A) MAZE (N/A) TRANSESOPHAGEAL ECHOCARDIOGRAM (TEE) (N/A) Clipping Of Atrial Appendage Using AtriCure PROClip size 45MM (N/A)   Waking up on vent NSR w/ stable hemodynamics O2 sats 100% Chest tube output low Excellent UOP Labs okay  Plan: Continue routine early postop.  Wean to extubate.  Rexene Alberts, MD 02/19/2020 5:25 PM

## 2020-02-19 NOTE — Anesthesia Postprocedure Evaluation (Signed)
Anesthesia Post Note  Patient: Daniel Browning  Procedure(s) Performed: MITRAL VALVE REPAIR (MVR) USING MEMO 4D RING SIZE 34MM (N/A Chest) CORONARY ARTERY BYPASS GRAFTING (CABG) x ONE, USING LEFT INTERNAL MAMMARY ARTERY (N/A Chest) MAZE (N/A ) TRANSESOPHAGEAL ECHOCARDIOGRAM (TEE) (N/A ) Clipping Of Atrial Appendage Using AtriCure PROClip size 45MM (N/A Chest)     Patient location during evaluation: SICU Anesthesia Type: General Level of consciousness: sedated and patient remains intubated per anesthesia plan Pain management: pain level controlled Vital Signs Assessment: post-procedure vital signs reviewed and stable Respiratory status: patient remains intubated per anesthesia plan Cardiovascular status: stable Anesthetic complications: no    Last Vitals:  Vitals:   02/19/20 1830 02/19/20 1845  BP:  (!) 96/56  Pulse: 77 76  Resp: 18 17  Temp: 37 C   SpO2: 95% 96%    Last Pain:  Vitals:   02/19/20 1615  TempSrc: Core  PainSc:                  Daniel Browning

## 2020-02-19 NOTE — Anesthesia Procedure Notes (Signed)
Arterial Line Insertion Start/End3/10/2020 6:50 AM, 02/19/2020 7:00 AM Performed by: Glynda Jaeger, CRNA, CRNA  Preanesthetic checklist: patient identified, IV checked, site marked, risks and benefits discussed, surgical consent, monitors and equipment checked, pre-op evaluation, timeout performed and anesthesia consent Lidocaine 1% used for infiltration Left, radial was placed Catheter size: 20 G Hand hygiene performed  and maximum sterile barriers used  Allen's test indicative of satisfactory collateral circulation Attempts: 2 Procedure performed without using ultrasound guided technique. Following insertion, dressing applied and Biopatch. Post procedure assessment: normal  Patient tolerated the procedure well with no immediate complications.

## 2020-02-19 NOTE — Op Note (Signed)
CARDIOTHORACIC SURGERY OPERATIVE NOTE  Date of Procedure:  02/19/2020  Preoperative Diagnosis:   Severe Mitral Regurgitation  Severe Single Vessel Coronary Artery Disease  Persistent Atrial Fibrillation  Postoperative Diagnosis: Same  Procedure:   Mitral Valve Repair  Complex valvuloplasty including artificial Gore-tex neochord placement x8  Decalcification of posterior leaflet and posterior annulus  Sliding leaflet plasty  Suture plication of anterior commissure and P1/P2  Sorin Memo 4D ring annuloplasty (size 87mm, ref #[id number], serial [id number])   Coronary Artery Bypass Grafting x 1   Left Internal Mammary Artery to Distal Left Anterior Descending Coronary Artery   Maze Procedure   complete bilateral atrial lesion set using bipolar radiofrequency and cryothermy ablation  clipping of left atrial appendage (Atriclip size 12mm)    Surgeon: Valentina Gu. 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  Multiple elongated and single ruptured primary chordae tendinae  Severe calcification involving base of prolapsing P2 segment of posterior leaflet  Type II dysfunction with severe mitral regurgitation  Normal left ventricular systolic function  Good quality left internal mammary artery conduit for grafting  Good quality distal left anterior descending coronary artery for grafting  No residual mitral regurgitation after successful valve repair                     BRIEF CLINICAL NOTE AND INDICATIONS FOR SURGERY  Patient is a 64 yo male with history of GERD, mitral valve prolapse with life-long heart murmur and CAD referred for surgical consultation to discuss treatment options for management of recently diagnosed severe, symptomatic primary mitral regurgitation, single-vessel coronary artery disease with stable angina pectoris and presumably  recent onset persistent atrial fibrillation.  Patient states that he has been told that he had a heart murmur since his teenage years. He was first evaluated by a cardiologist in 2003 when he had an abnormal stress test performed for evaluation of chest pain. Catheterization performed at that time reportedly revealed non-obstructive coronary artery disease. Patient states that he also had an echocardiogram performed at that time that revealed mitral valve prolapse which he was told was not worrisome but needed to be watched intermittently.  Patient states that approximately 1 year ago he began to experience intermittent chest discomfort and SOB with exertion. He initially attributed these symptoms as reflux, but they persisted and gradually got worse. He denies any prolonged episodes of chest pain and symptoms have always been associated with exertion and relieved by rest. He denies PND, orthopnea, or lower extremity edema. He also denies any symptoms of palpitations, dizzy spells or syncope.  Patient eventually presented to his primary care physician last November who discovered a new prominent systolic murmur on examination. He was referred for cardiology consult and evaluated by Dr. Earnie Larsson on January 07, 2020. He was found to be in atrial fibrillation at the time and started on oral Xarelto for anticoagulation. Transthoracic echocardiogram revealed mitral valve prolapse with severe mitral regurgitation and low normal LV systolic function with ejection fraction reported 55% in the setting of severe mitral regurgitation. The patient was evaluated by Dr. Gerrit Friends at [facility] for possible mitral valve repair who recommended diagnostic cardiac catheterization. TEE and diagnostic catheterization were performed February 03, 2020. TEE confirmed the presence of mitral valve prolapse with an obvious flail segment of the posterior leaflet and severe mitral regurgitation. There was severe left atrial  enlargement. Left ventricular function was reported  normal with ejection fraction 55-60%. Diagnostic cardiac catheterization revealed severe coronary artery disease with chronic occlusion of the mid left anterior descending coronary artery with mild non-obstructive disease in the left circumflex and right coronary arteries. There was mild pulmonary hypertension and large V waves on wedge tracing consistent with severe mitral regurgitation. The patient was referred for a second surgical consultation.  The patient has been seen in consultation and counseled at length regarding the indications, risks and potential benefits of surgery.  All questions have been answered, and the patient provides full informed consent for the operation as described.    DETAILS OF THE OPERATIVE PROCEDURE  Preparation:  The patient is brought to the operating room on the above mentioned date and central monitoring was established by the anesthesia team including placement of Swan-Ganz catheter and radial arterial line. The patient is placed in the supine position on the operating table.  Intravenous antibiotics are administered. General endotracheal anesthesia is induced uneventfully. A Foley catheter is placed.  Baseline transesophageal echocardiogram was performed.  Findings were notable for mitral valve prolapse with severe mitral regurgitation.  There was normal left ventricular systolic function although there was some mild left ventricular chamber enlargement.  There was severe left and right atrial enlargement.  There was normal right ventricular size and function.  There was no tricuspid regurgitation.  The patient's chest, abdomen, both groins, and both lower extremities are prepared and draped in a sterile manner. A time out procedure is performed.   Percutaneous Vascular Access:  Percutaneous venous access were obtained on the right side.  Using ultrasound guidance the right common femoral vein was  cannulated using the Seldinger technique and a pair of Perclose vascular closure devises were placed at opposing 30 degree angles, after which time an 8 French sheath inserted.    Surgical Approach and Conduit Harvest:  A median sternotomy incision was performed and the left internal mammary artery is dissected from the chest wall and prepared for bypass grafting. The left internal mammary artery is notably good quality conduit.  After systemic heparinization the left internal mammary artery was transected distally and noted to have excellent flow.   Extracorporeal Cardiopulmonary Bypass and Myocardial Protection:  The pericardium is opened.  The ascending aorta is normal in appearance.the ascending aorta is cannulated for cardiopulmonary bypass.  The right common femoral vein is cannulated through the venous sheath and a guidewire advanced into the right atrium using TEE guidance.  The femoral vein cannulated using a 22 Fr long femoral venous cannula.   A retrograde cardioplegia cannula is placed through the right atrium into the coronary sinus.  Adequate heparinization is verified.   Cardiopulmonary bypass was begun and the surface of the heart is inspected.  A second venous cannula is placed directly into the superior vena cava.   A cardioplegia cannula is placed in the ascending aorta.  A temperature probe was placed in the interventricular septum.  The patient is cooled to 32C systemic temperature.  The aortic cross clamp is applied and cardioplegia is delivered initially in an antegrade fashion through the aortic root using modified [place] cold blood cardioplegia (Kennestone blood cardioplegia protocol).   The initial cardioplegic arrest is rapid with early diastolic arrest.  Repeat doses of cardioplegia are administered at 90 minutes and every 30 minutes thereafter through the coronary sinus catheter in order to maintain completely flat electrocardiogram.  Myocardial protection was felt to be  excellent.   Maze Procedure (left atrial lesion set - part  I):  The AtriCure Synergy bipolar radiofrequency ablation clamp is used for all radiofrequency ablation lesions for the maze procedure.  The Atricure CryoICE nitrous oxide cryothermy system is utilized for all cryothermy ablation lesions.   The heart is retracted towards the surgeon's side and the left sided pulmonary veins exposed.  An elliptical ablation lesion is created around the base of the left sided pulmonary veins.  A similar elliptical lesion was created around the base of the left atrial appendage.  The left atrial appendage was obliterated using an left atrial appendage clip (Atricure Pro245 left atrial clip size 45 mm).  The heart was replaced into the pericardial sac.   Coronary Artery Bypass Grafting:  The distal left anterior coronary artery was grafted with the left internal mammary artery in an end-to-side fashion.  At the site of distal anastomosis the target vessel was good quality and measured approximately 2.0 mm in diameter.   Maze Procedure (left atrial lesion set - part II):  An elliptical ablation lesion is created around the base of the right sided pulmonary veins.    A left atriotomy incision was performed through the interatrial groove and extended partially across the back wall of the left atrium after opening the oblique sinus inferiorly.  The floor of the left atrium and the mitral valve were exposed using a self-retaining retractor.  The mitral valve was inspected.  A bipolar ablation lesion was placed across the dome of the left atrium from the cephalad apex of the atriotomy incision to reach the cephalad apex of the elliptical lesion around the left sided pulmonary veins.  A similar bipolar lesion was placed across the back wall of the left atrium from the caudad apex of the atriotomy incision to reach the caudad apex of the elliptical lesion around the left sided pulmonary veins, thereby completing a  box.  Finally another bipolar lesion was placed across the back wall of the left atrium from the caudad apex of the atriotomy incision towards the posterior mitral valve annulus.  This lesion was completed along the endocardial surface onto the posterior mitral annulus with a 3 minute duration cryothermy lesion, followed by a second cryothermy lesion along the posterior epicardial surface of the left atrium across the coronary sinus with the probe extending up the epicardial surface of the lateral wall of the right atrium. This completes the entire left side lesion set of the Cox maze procedure.   Mitral Valve Repair:  The mitral valve was inspected and notable for forme fruste variant of Barlow's type myxomatous degenerative disease.  There was billowing with excessive leaflet tissue involving the majority of the posterior leaflet and severe prolapse of the middle scallop (P2) of the posterior leaflet.  There was no billowing or prolapse of the anterior leaflet.  There was severe calcification involving the base of the P2 segment of the posterior leaflet which extended beneath the annulus into the posterior wall and involves some of the subvalvular apparatus including both secondary and tertiary chordae tendinae.  A portion of this calcification had ulcerated through the endothelial surface of the left atrium just inferior to the posterior annulus.  A segment of this ulceration was swabbed for routine culture.  There was a single ruptured primary chordae tendinae to the P2 segment of the posterior leaflet with the remaining cords all severely elongated.  There was severe billowing and prolapse of P1 and mild billowing of P3.  Interrupted 2-0 Ethibond horizontal mattress sutures are placed circumferentially around the entire  mitral annulus.  These sutures will ultimately be utilized for ring annuloplasty and are currently utilized to suspend the valve symmetrically to facilitate exposure.  Artificial  neochord placement was performed using Chord-X multi-strand CV-4 Goretex pre-measured loops.  The appropriate cord length was measured from corresponding normal length primary cords from the P3 segment of the posterior leaflet.  This length is shortened to 20 mm to facilitate correction of the severe prolapse involving the P2 segment. The papillary muscle suture of a Chord-X multi-strand suture was placed through the head of the posterior papillary muscle in a horizontal mattress fashion and tied over Teflon felt pledgets. Each of the three pre-measured loops were then reimplanted into the free margin of the P2 segment of the posterior leaflet on the posterior side of midline. The papillary muscle suture of a second Chord-X multi-strand suture was placed through the head of the anterior papillary muscle in a horizontal mattress fashion and tied over Teflon felt pledgets.  This second multistrand suture is shortened to 16 mm because of excessive prolapse involving the anterior side of P2.  Each of the three pre-measured loops were then reimplanted into the free margin of the P2 segment of the posterior leaflet on the anterior side of midline.    Valve is tested with saline and the prolapse appears corrected.  However, the severe calcification at the base of P2 limits leaflet mobility considerably and it appears that it would be considerable likelihood of recurrent leak.  Subsequently, the P2 segment is mobilized off of the posterior annulus using an 11 blade knife.  The calcification at the base of P2 and beneath the base of P2 is all debrided carefully using rongeur forceps.  The annuloplasty sutures in this area are removed during debridement and subsequently replaced to shorten the posterior annulus dimension.  The P2 segment is then reattached to the posterior annulus using sliding leaflet plasty technique with 2 layer closure of running 4-0 Prolene suture.  The valve was tested with saline and appears  dramatically improved.  The indentation or cleft between P1 and P2 is closed using a pair of everting 4-0 Prolene suture.  Similarly, the anterior commissure is plicated using a pair of everting 4-0 Prolene sutures placed between P1 and the commissural leaflet.  Valve is again tested with saline and appears reasonably competent.  The valve is sized to a 34 mm annuloplasty ring.  A Sorin Memo 4D ring annuloplasty (size 34 mm, ref# [id number], serial # [id number]) was implanted uneventfully. The valve is tested with saline and appears perfectly competent.  Rewarming is begun.  The atriotomy was closed using a 2-layer closure of running 3-0 Prolene suture after placing a sump drain across the mitral valve to serve as a left ventricular vent.  One final dose of warm retrograde "reanimation dose" cardioplegia was administered retrograde through the coronary sinus catheter while all air was evacuated through the aortic root.  The aortic cross clamp was removed after a total cross clamp time of 198 minutes.   Maze Procedure (right atrial lesion set):  The retrograde cardioplegia cannula was removed and the small hole in the right atrium extended a short distance.  The AtriCure Synergy bipolar radiofrequency ablation clamp is utilized to create a series of linear lesions in the right atrium, each with one limb of the clamp along the endocardial surface and the other along the epicardial surface. The first lesion is placed from the posterior apex of the atriotomy incision and along the lateral wall  of the right atrium to reach the lateral aspect of the superior vena cava, connecting with the cryothermy lesion placed previously across the coronary sinus and up the epicardial surface of the lateral right atrium.  A second lesion is placed in the opposite direction from the posterior apex of the atriotomy incision along the lateral wall to reach the lateral aspect of the inferior vena cava. A third lesion is placed from the  midportion of the atriotomy incision extending at a right angle to reach the tip of the right atrial appendage. A fourth lesion is placed from the anterior apex of the atriotomy incision in an anterior and inferior direction to reach the acute margin of the heart. Finally, the cryotherapy probe is utilized to complete the right atrial lesion set by placing the probe along the endocardial surface of the right atrium from the anterior apex of the atriotomy incision to reach the tricuspid annulus at the 2:00 position. The right atriotomy incision is closed with a 2 layer closure of running 4-0 Prolene suture.   Procedure Completion:  Epicardial pacing wires are fixed to the right ventricular outflow tract and to the right atrial appendage. The patient is rewarmed to 37C temperature. The aortic and left ventricular vents are removed.  The patient is weaned and disconnected from cardiopulmonary bypass.  Initially TEE reveals evidence of significant air within the left ventricular chamber and cardiopulmonary bypass is resumed.  The patient is placed in Trendelenburg position.  An 18-gauge Angiocath is placed in the left ventricular apex to evacuate as much residual air as possible.  The site of Angiocath placement is subsequently repaired with a single 3-0 Prolene suture.  The patient is subsequently weaned from cardiopulmonary bypass without difficulty.  The patient's rhythm at separation from bypass was sinus.  The patient was weaned from cardioplegic bypass  without any inotropic support. Total cardiopulmonary bypass time for the operation was 249 minutes.  Followup transesophageal echocardiogram performed after separation from bypass revealed a well-seated annuloplasty ring in the mitral position and a normal functioning mitral valve.  There is no residual mitral regurgitation.  Mean transvalvular gradient across the mitral valve is estimated between 2 and 3 mmHg.  Left ventricular function appears normal  although there is some left ventricular chamber enlargement.  There is mild right ventricular dysfunction.  Low-dose milrinone infusion is added.    The aortic and superior vena cava cannula were removed uneventfully. Protamine was administered to reverse the anticoagulation. The femoral venous cannula was removed and Perclose sutures secured.  Manual pressure held on the groin for 30 minutes.  The mediastinum and pleural space were inspected for hemostasis and irrigated with saline solution. The mediastinum and the left pleural space were drained using 3 chest tubes placed through separate stab incisions inferiorly.  The soft tissues anterior to the aorta were reapproximated loosely. The sternum is closed with Fibertape cerlage. The soft tissues anterior to the sternum were closed in multiple layers and the skin is closed with a running subcuticular skin closure.   The post-bypass portion of the operation was notable for stable rhythm and hemodynamics.   No blood products were administered during the operation.   Patient Disposition:  The patient tolerated the procedure well and is transported to the surgical intensive care in stable condition. There are no intraoperative complications. All sponge instrument and needle counts are verified correct at completion of the operation.     Valentina Gu. Roxy Manns MD 02/19/2020 3:15 PM

## 2020-02-19 NOTE — Anesthesia Procedure Notes (Signed)
Procedure Name: Intubation Date/Time: 02/19/2020 7:58 AM Performed by: Glynda Jaeger, CRNA Pre-anesthesia Checklist: Patient identified, Patient being monitored, Timeout performed, Emergency Drugs available and Suction available Patient Re-evaluated:Patient Re-evaluated prior to induction Oxygen Delivery Method: Circle System Utilized Preoxygenation: Pre-oxygenation with 100% oxygen Induction Type: IV induction Ventilation: Mask ventilation without difficulty and Oral airway inserted - appropriate to patient size Laryngoscope Size: Mac and 4 Grade View: Grade I Tube type: Oral Tube size: 8.0 mm Number of attempts: 1 Airway Equipment and Method: Stylet Placement Confirmation: ETT inserted through vocal cords under direct vision,  positive ETCO2 and breath sounds checked- equal and bilateral Secured at: 22 cm Tube secured with: Tape Dental Injury: Teeth and Oropharynx as per pre-operative assessment

## 2020-02-19 NOTE — Interval H&P Note (Signed)
History and Physical Interval Note:  02/19/2020 6:03 AM  Daniel Browning  has presented today for surgery, with the diagnosis of MR CAD AFIB.  The various methods of treatment have been discussed with the patient and family. After consideration of risks, benefits and other options for treatment, the patient has consented to  Procedure(s): MITRAL VALVE REPAIR (MVR) (N/A) CORONARY ARTERY BYPASS GRAFTING (CABG) (N/A) MAZE (N/A) TRANSESOPHAGEAL ECHOCARDIOGRAM (TEE) (N/A) as a surgical intervention.  The patient's history has been reviewed, patient examined, no change in status, stable for surgery.  I have reviewed the patient's chart and labs.  Questions were answered to the patient's satisfaction.     Rexene Alberts

## 2020-02-19 NOTE — H&P (Signed)
[address]       [place] [address]             [phone]          CARDIOTHORACIC SURGERY HISTORY AND PHYSICAL EXAM  Referring Provider is Patwardhan, Reynold Bowen, MD PCP is Daniel Arabian, MD      Chief Complaint  Patient presents with  . Mitral Regurgitation    Surgical eval, Cardiac Cath and TEE 02/03/20    HPI:  Patient is a 64 yo male with history of GERD, mitral valve prolapse with life-long heart murmur and CAD referred for surgical consultation to discuss treatment options for management of recently diagnosed severe, symptomatic primary mitral regurgitation, single-vessel coronary artery disease with stable angina pectoris and presumably recent onset persistent atrial fibrillation.  Patient states that he has been told that he had a heart murmur since his teenage years.  He was first evaluated by a cardiologist in 2003 when he had an abnormal stress test performed for evaluation of chest pain.  Catheterization performed at that time reportedly revealed non-obstructive coronary artery disease.  Patient states that he also had an echocardiogram performed at that time that revealed mitral valve prolapse which he was told was not worrisome but needed to be watched intermittently.  Patient states that approximately 1 year ago he began to experience intermittent chest discomfort and SOB with exertion.  He initially attributed these symptoms as reflux, but they persisted and gradually got worse.  He denies any prolonged episodes of chest pain and symptoms have always been associated with exertion and relieved by rest.  He denies PND, orthopnea, or lower extremity edema.  He also denies any symptoms of palpitations, dizzy spells or syncope.  Patient eventually presented to his primary care physician last November who discovered a new prominent systolic murmur on examination.  He was referred for cardiology consult and evaluated by Daniel Browning on January 07, 2020.  He was found to be in atrial fibrillation at the time and started on oral Xarelto for anticoagulation.  Transthoracic echocardiogram revealed mitral valve prolapse with severe mitral regurgitation and low normal LV systolic function with ejection fraction reported 55% in the setting of severe mitral regurgitation.  The patient was evaluated by Daniel Browning at [facility] for possible mitral valve repair who recommended diagnostic cardiac catheterization.  TEE and diagnostic catheterization were performed February 03, 2020.  TEE confirmed the presence of mitral valve prolapse with an obvious flail segment of the posterior leaflet and severe mitral regurgitation.  There was severe left atrial enlargement.  Left ventricular function was reported normal with ejection fraction 55-60%.  Diagnostic cardiac catheterization revealed severe coronary artery disease with chronic occlusion of the mid left anterior descending coronary artery with mild non-obstructive disease in the left circumflex and right coronary arteries.  There was mild pulmonary hypertension and large V waves on wedge tracing consistent with severe mitral regurgitation.  The patient was referred for a second surgical consultation.  Patient is married and lives locally in [place] with his wife and an adult daughter who is disabled.  He has 2 other adult daughters who both live and work in [place].  He works full-time at [employer] in [place].  He previously owned and operated a Tree surgeon.  The patient does not exercise on a regular basis but he reports no significant physical limitations.  He enjoys playing golf although he has not been playing as much recently.  He  spends a lot of time working around the house when he is not at work.   Patient is a 64 yo male with history of GERD, mitral valve prolapse with life-long heart murmur and CAD who returns to the office with tentative plans to proceed with elective surgical intervention  later this week for management of recently diagnosed severe, symptomatic primary mitral regurgitation, single-vessel coronary artery disease with stable angina pectoris and presumably recent onset persistent atrial fibrillation.  He was originally seen in consultation on February 05, 2020.  He reports no new problems or complaints over the last few weeks.  He stopped taking Xarelto 2 days ago in anticipation of his upcoming surgery.   Past Medical History:  Diagnosis Date  . Atrial fibrillation ([place])   . Colon polyps   . Coronary artery disease   . GERD (gastroesophageal reflux disease)   . Heart murmur   . Nonrheumatic mitral valve regurgitation     Past Surgical History:  Procedure Laterality Date  . APPENDECTOMY  1982  . COLONOSCOPY    . HIP SURGERY Right 2008   8-9 screws- s/p 30 ft fall  . POLYPECTOMY    . radial head surgery Right 2008   from 30 ft. fall-   . RIGHT/LEFT HEART CATH AND CORONARY ANGIOGRAPHY N/A 02/03/2020   Procedure: RIGHT/LEFT HEART CATH AND CORONARY ANGIOGRAPHY;  Surgeon: Nigel Mormon, MD;  Location: [facility];  Service: Cardiovascular;  Laterality: N/A;  . TEE WITHOUT CARDIOVERSION N/A 02/03/2020   Procedure: TRANSESOPHAGEAL ECHOCARDIOGRAM (TEE);  Surgeon: Nigel Mormon, MD;  Location: [facility] END[facility]COPY;  Service: Cardiovascular;  Laterality: N/A;  . UPPER GASTROINTESTINAL ENDOSCOPY      Family History  Problem Relation Age of Onset  . Lung cancer Mother 80  . Aneurysm Father 58  . Colon polyps Neg Hx   . Colon cancer Neg Hx   . Esophageal cancer Neg Hx   . Rectal cancer Neg Hx   . Stomach cancer Neg Hx     Social History Social History   Tobacco Use  . Smoking status: Never Smoker  . Smokeless tobacco: Never Used  Substance Use Topics  . Alcohol use: Yes    Comment: rarely  . Drug use: No    Prior to Admission medications   Medication Sig Start Date End Date Taking? Authorizing Provider  furosemide (LASIX) 20 MG tablet  Take 1 tablet (20 mg total) by mouth daily. 01/22/20 04/21/20 Yes Patwardhan, Manish J, MD  nitroGLYCERIN (NITROSTAT) 0.4 MG SL tablet Place 1 tablet (0.4 mg total) under the tongue every 5 (five) minutes as needed for chest pain. 01/07/20 04/06/20 Yes Patwardhan, Manish J, MD  omeprazole (PRILOSEC OTC) 20 MG tablet Take 20 mg by mouth daily.   Yes [provider]  Potassium 99 MG TABS Take 99 mg by mouth daily.   Yes [provider]  rivaroxaban (XARELTO) 20 MG TABS tablet Take 1 tablet (20 mg total) by mouth daily with supper. Patient not taking: Reported on 02/16/2020 01/07/20  Yes Patwardhan, Manish J, MD    No Known Allergies  Review of Systems:              General:                      normal appetite, decreased energy, no weight gain, no weight loss, no fever             Cardiac:                       +  chest pain with exertion, no chest pain at rest, +SOB with exertion, no resting SOB, no PND, no orthopnea, no palpitations, + arrhythmia, + atrial fibrillation, no LE edema, no dizzy spells, no syncope             Respiratory:                 + exertion shortness of breath, no home oxygen, + productive cough, + dry cough, no bronchitis, no wheezing, no hemoptysis, no asthma, no pain with inspiration or cough, no sleep apnea, no CPAP at night             GI:                               no difficulty swallowing, + reflux, no frequent heartburn, no hiatal hernia, no abdominal pain, no constipation, no diarrhea, no hematochezia, no hematemesis, no melena             GU:                              no dysuria,  no frequency, no urinary tract infection, no hematuria, no enlarged prostate, no kidney stones, no kidney disease             Vascular:                     no pain suggestive of claudication, no pain in feet, no leg cramps, no varicose veins, no DVT, no non-healing foot ulcer             Neuro:                         no stroke, no TIA's, no seizures, no headaches, no  temporary blindness one eye,  no slurred speech, no peripheral neuropathy, no chronic pain, no instability of gait, no memory/cognitive dysfunction             Musculoskeletal:         no arthritis, no joint swelling, no myalgias, no difficulty walking, normal mobility              Skin:                            no rash, no itching, no skin infections, no pressure sores or ulcerations             Psych:                         no anxiety, no depression, no nervousness, no unusual recent stress             Eyes:                           no blurry vision, no floaters, no recent vision changes, + wears glasses for reading only             ENT:                            no hearing loss, no loose or painful teeth, no dentures, last saw dentist 1 year ago  Hematologic:               no easy bruising, no abnormal bleeding, no clotting disorder, no frequent epistaxis             Endocrine:                   no diabetes, does not check CBG's at home                           Physical Exam:              BP 133/86 (BP Location: Right Arm, Patient Position: Sitting, Cuff Size: Normal)   Pulse 60   Temp 97.7 F (36.5 C) (Oral)   Resp 20   Ht 5\' 10"  (1.778 m)   Wt 188 lb (85.3 kg)   SpO2 97% Comment: RA  BMI 26.98 kg/m              General:                       well-appearing             HEENT:                       Unremarkable              Neck:                           no JVD, no bruits, no adenopathy              Chest:                          clear to auscultation, symmetrical breath sounds, no wheezes, no rhonchi              CV:                              Irregular rate and rhythm w/ prominent holosystolic murmur              Abdomen:                    soft, non-tender, no masses              Extremities:                 warm, well-perfused, pulses palpable, no LE edema             Rectal/GU                   Deferred             Neuro:                         Grossly  non-focal and symmetrical throughout             Skin:                            Clean and dry, no rashes, no breakdown   Diagnostic Tests:  Echocardiogram 01/14/2020:  Left ventricle cavity is normal in size. Mild concentric hypertrophy of  the left ventricle. Normal global wall  motion. Normal LV systolic function  with EF 55%. Diastolic function not assessed due to severity of mitral  regurgitation.   Left atrial cavity is severely dilated.  Flail posterior leaflet with mild thickening. Severe, eccentric,  anteriorly directed mitral regurgitation.  Mild tricuspid regurgitation.  Estimated pulmonary artery systolic pressure is 29 mmHg.      TRANSESOPHOGEAL ECHO REPORT       Patient Name:  Daniel Browning Date of Exam: 02/03/2020  Medical Rec #: [id number]   Height:    70.0 in  Accession #:  [id number]  Weight:    195.0 lb  Date of Birth: 1955-12-20   BSA:     2.065 m  Patient Age:  24 years   BP:      129/81 mmHg  Patient Gender: M       HR:      104 bpm.  Exam Location: Outpatient   Procedure: Transesophageal Echo   Indications:   mitral regurgitation    History:     Patient has no prior history of Echocardiogram  examinations.          Arrythmias:Atrial Fibrillation.    Sonographer:   Johny Chess RDCS  Referring Phys: [id number] [facility] J PATWARDHAN  Diagnosing Phys: Vernell Leep MD   PROCEDURE: After discussion of the risks and benefits of a TEE, an  informed consent was obtained from the patient. The transesophogeal probe  was passed without difficulty through the esophogus of the patient. Local  oropharyngeal anesthetic was provided  with Cetacaine. Sedation performed by different physician. Image quality  was good. The patient's vital signs; including heart rate, blood pressure,  and oxygen saturation; remained stable throughout the procedure. The  patient developed no complications   during the procedure. Deep sedation administered and monitored by  anesthesiology.   IMPRESSIONS    1. Left ventricular ejection fraction, by estimation, is 55 to 60%. The  left ventricle has normal function. The left ventricle has no regional  wall motion abnormalities. Left ventricular diastolic function could not  be evaluated.  2. Right ventricular systolic function is normal. The right ventricular  size is normal.  3. Left atrial size was severely dilated. No left atrial/left atrial  appendage thrombus was detected.  4. 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  5. No other significant valvular abnormality.   FINDINGS  Left Ventricle: Left ventricular ejection fraction, by estimation, is 55  to 60%. The left ventricle has normal function. The left ventricle has no  regional wall motion abnormalities. The left ventricular internal cavity  size was normal in size. There is  no left ventricular hypertrophy.   Right Ventricle: The right ventricular size is normal. No increase in  right ventricular wall thickness. Right ventricular systolic function is  normal.   Left Atrium: Left atrial size was severely dilated. No left atrial/left  atrial appendage thrombus was detected.   Right Atrium: Right atrial size was normal in size.   Pericardium: There is no evidence of pericardial effusion.   Mitral Valve: The mitral valve is myxomatous. There is severe prolapse of  the middle scallop of the posterior leaflet of the mitral valve. Severe  mitral valve regurgitation. MV peak gradient, 9.2 mmHg. The mean mitral  valve gradient is 4.0 mmHg.   Tricuspid Valve: The tricuspid valve is grossly normal. Tricuspid valve  regurgitation is trivial.   Aortic Valve: The aortic valve is tricuspid. Aortic valve regurgitation is  not visualized.  Pulmonic Valve: The pulmonic valve was normal in structure. Pulmonic valve  regurgitation is  not visualized.   Aorta: The aortic root is normal in size and structure.   IAS/Shunts: No atrial level shunt detected by color flow Doppler.     MITRAL VALVE  MV Peak grad: 9.2 mmHg  MV Mean grad: 4.0 mmHg  MV Vmax:   1.52 m/s  MV Vmean:   90.4 cm/s  MR Peak grad: 105.5 mmHg  MR Mean grad: 57.0 mmHg  MR Vmax:   513.50 cm/s  MR Vmean:   346.5 cm/s   Manish Patwardhan MD  Electronically signed by Vernell Leep MD  Signature Date/Time: 02/03/2020/5:28:05 PM    RIGHT/LEFT HEART CATH AND CORONARY ANGIOGRAPHY  Conclusion  LM: Normal LAD: Mid LAD CTO with faint collaterals from Diag and RCA LCx: Prox OM1 focal 40% stenosis RCA: Normal  RA: 6 mmHg RV: 31/2 mmHg PA: 36/14 mmHg, mean PAP 23 mmHg PCW: Mean 16 mmHg with tall V wave LVEDP 7 mmHg CO: 5.4 L/min CI: 2.6 L/min.m2  Impression: Single vessel obstructive CAD (LAD CTO) Nonobstructive prox OM1 disease Tall V waves s/o severe mitral regurgitation  Nigel Mormon, MD [facility] Pager: [phone] Office: [phone]    Recommendations  Antiplatelet/Anticoag On Aspirin and rivaroxaban for CAD and Afib  Surgeon Notes    02/03/2020 9:41 AM CV Procedure signed by Nigel Mormon, MD  Indications  Nonrheumatic mitral valve regurgitation [I34.0 (ICD-10-CM)]  Procedural Details  Technical Details Procedures: 1. Right heart catheterization 2. Left heart catheterization 3. Selective right and left coronary angiography 4. Conscious sedation monitoring 36 min  Indication: Mitral regurgitation  History: 64 y.o.Caucasianmalewith mild nonobstructive coronary artery disease(Cath 2003),severe primary MR,. New onset Afib.  Diagnostic Angiography: 5 Fr TIG  Pressures tracings obtained in right atrium, right ventricle, pulmonary artery, and pulmonary capillary wedge position.   Anticoagulation:  4500 units heparin  Hemostasis: TR band  Total  contrast used: 70 cc   Total fluoro time: 5.4 min Air Kerma: 699 mGy  All wires and catheters removed out of the body at the end of the procedure Final angiogram showed no dissection/perforation         Estimated blood loss <50 mL.   During this procedure medications were administered to achieve and maintain moderate conscious sedation while the patient's heart rate, blood pressure, and oxygen saturation were continuously monitored and I was present face-to-face 100% of this time.     RIGHT/LEFT HEART CATH AND CORONARY ANGIOGRAPHY  None Documented by Nigel Mormon, MD 02/03/2020 6:23 PM  Date Found: 02/03/2020  Time Range: Intraprocedure      Coronary Findings  Diagnostic Dominance: Co-dominant Left Anterior Descending  Collaterals  Dist LAD filled by collaterals from 1st Diag.    Collaterals  Dist LAD filled by collaterals from RPDA.    Mid LAD to Dist LAD lesion 100% stenosed  Mid LAD to Dist LAD lesion is 100% stenosed.  Left Circumflex  First Obtuse Marginal Branch  1st Mrg lesion 40% stenosed  1st Mrg lesion is 40% stenosed.  Right Coronary Artery  Prox RCA lesion 0% stenosed  Non-stenotic Prox RCA lesion.  Intervention  No interventions have been documented. Right Heart  Right Heart Pressures RA: 6 mmHg RV: 31/2 mmHg PA: 36/14 mmHg, mean PAP 23 mmHg PCW: Mean 16 mmHg with tall V wave  Left Heart  Left Ventricle LVEDP 7 mmHg  CO: 5.4 L/min CI: 2.6 L/min.m2  Coronary Diagrams  Diagnostic Dominance:  Co-dominant  Intervention  Implants   No implant documentation for this case.  Syngo Images  Show images for CARDIAC CATHETERIZATION  Images on Long Term Storage  Show images for Bow, Ader to Procedure Log  Procedure Log    Hemo Data   Most Recent Value  Fick Cardiac Output 5.48 L/min  Fick Cardiac Output Index 2.69 (L/min)/BSA  RA A Wave 7 mmHg  RA V Wave 7 mmHg  RA Mean 6 mmHg  RV  Systolic Pressure 31 mmHg  RV Diastolic Pressure 2 mmHg  RV EDP 6 mmHg  PA Systolic Pressure 36 mmHg  PA Diastolic Pressure 14 mmHg  PA Mean 23 mmHg  PW A Wave 14 mmHg  PW V Wave 26 mmHg  PW Mean 16 mmHg  AO Systolic Pressure 0000000 mmHg  AO Diastolic Pressure 82 mmHg  AO Mean 96 mmHg  LV Systolic Pressure 93 mmHg  LV Diastolic Pressure 4 mmHg  LV EDP 7 mmHg  AOp Systolic Pressure [id number] mmHg  AOp Diastolic Pressure 71 mmHg  AOp Mean Pressure 86 mmHg  LVp Systolic Pressure 123XX123 mmHg  LVp Diastolic Pressure 7 mmHg  LVp EDP Pressure 9 mmHg  QP/QS 1  TPVR Index 8.56 HRUI  TSVR Index 35.75 HRUI  PVR SVR Ratio 0.08  TPVR/TSVR Ratio 0.24      Impression:  Patient has mitral valve prolapse with stage D severe symptomatic primary mitral regurgitation, severe vessel coronary artery disease with stable exertional angina, and recent onset persistent atrial fibrillation. He describes stable symptoms of exertional chest discomfort and shortness of breath that have been slowly progressing over the past year consistent with classical angina pectoris and chronic diastolic congestive heart failure, functional class II.  I have personally reviewed the patient's recent transesophageal echocardiogram and diagnostic cardiac catheterization. TEE reveals myxomatous degenerative disease of the mitral valve with an obvious flail segment involving a portion of the posterior leaflet with ruptured primary chordae tendinaeand severe mitral regurgitation. The jet of regurgitation is eccentric. The left atrium is dilated. There is normal left ventricular size and systolic function. Catheterization reveals multivessel coronary artery disease with 100% chronic occlusion of the mid left anterior descending coronary artery just after a medium size diagonal branch. There are faint collateralsfilling the terminal portion of the left anterior descending coronary artery from both the diagonal branch and the right  coronary circulation. The distal portion of the left anterior descending coronary artery is not well-visualized and may be diffusely diseased. There is nonobstructive disease in the left circumflex and right coronary territories. There was mild pulmonary hypertension with large V waves on pulmonary capillary wedge tracing.  I agree the patient needs mitral valve repair. Although the patient's terminal portion of the left anterior descending coronary artery may be diffusely diseased, I would favor an attempt at coronary artery bypass grafting at the time of surgery. Finally, the patient may benefit from concomitant Maze procedure.   Plan:  The patientand his wife were againcounseled at length regarding the indications, risks and potential benefits of mitral valve repair and coronary artery bypass grafting. The rationale for elective surgery has been explained, including a comparison between surgery and continued medical therapy with close follow-up. The likelihood of successful and durable mitral valve repair has been discussed with particular reference to the findings of their recent echocardiogram. Based upon these findings and previous experience, I have quoted them a greater than 95percent likelihood of successful valve repair with less than 1percent risk of  mortality or major morbidity. Alternative surgical approaches have been discussed including a comparison between conventional sternotomy and minimally-invasive techniques. The rationale for coronary artery bypass grafting and the associated need to proceed with surgery via conventional median sternotomy was discussed.The relative risks and benefits of performing a maze procedure at the time ofhissurgery was discussed at length, including the expected likelihood of long term freedom from recurrent symptomatic atrial fibrillation and/or atrial flutter. Expectations for the patient's postoperative convalescence has been discussed.    The patient understands and accepts all potential risks of surgery including but not limited to risk of death, stroke or other neurologic complication, myocardial infarction, congestive heart failure, respiratory failure, renal failure, bleeding requiring transfusion and/or reexploration, arrhythmia, infection or other wound complications, pneumonia, pleural and/or pericardial effusion, pulmonary embolus, aortic dissection or other major vascular complication, late recurrence of ischemic heart disease, or delayed complications related to valve repair or replacement including but not limited to structural valve deterioration and failure, thrombosis, embolization, endocarditis, or paravalvular leak.   All of their questions have been answered.      Valentina Gu. Roxy Manns, MD 02/16/2020 11:05 AM

## 2020-02-19 NOTE — Procedures (Signed)
Extubation Procedure Note  Patient Details:   Name: Daniel Browning DOB: 01-05-1956 MRN: [id number]   Airway Documentation:    Vent end date: 02/19/20 Vent end time: 1920   Evaluation  O2 sats: stable throughout Complications: No apparent complications Patient did tolerate procedure well. Bilateral Breath Sounds: Clear   Yes  NIF -30 VC: .9L IS 526ml.  Pt extubated per protocol to 4L [place]. Positive cuff leak, no stridor heard, VS stable throughout. Sp02 97%    Clance Boll 02/19/2020, 7:23 PM

## 2020-02-19 NOTE — Progress Notes (Signed)
  Echocardiogram Echocardiogram Transesophageal has been performed.  Daniel Browning 02/19/2020, 12:51 PM

## 2020-02-19 NOTE — Progress Notes (Deleted)
*  PRELIMINARY RESULTS* Echocardiogram 2D Echocardiogram has been performed.  Daniel Browning 02/19/2020, 12:50 PM

## 2020-02-19 NOTE — Anesthesia Procedure Notes (Signed)
Central Venous Catheter Insertion Performed by: Roberts Gaudy, MD, anesthesiologist Start/End3/10/2020 6:50 AM, 02/19/2020 7:00 AM Patient location: Pre-op. Preanesthetic checklist: patient identified, IV checked, site marked, risks and benefits discussed, surgical consent, monitors and equipment checked, pre-op evaluation, timeout performed and anesthesia consent Hand hygiene performed  and maximum sterile barriers used  PA cath was placed.Swan type:thermodilution Procedure performed without using ultrasound guided technique. Attempts: 1 Following insertion, line sutured, dressing applied and Biopatch. Post procedure assessment: blood return through all ports, free fluid flow and no air  Patient tolerated the procedure well with no immediate complications.

## 2020-02-19 NOTE — Transfer of Care (Signed)
Immediate Anesthesia Transfer of Care Note  Patient: Daniel Browning  Procedure(s) Performed: MITRAL VALVE REPAIR (MVR) USING MEMO 4D RING SIZE 34MM (N/A Chest) CORONARY ARTERY BYPASS GRAFTING (CABG) x ONE, USING LEFT INTERNAL MAMMARY ARTERY (N/A Chest) MAZE (N/A ) TRANSESOPHAGEAL ECHOCARDIOGRAM (TEE) (N/A ) Clipping Of Atrial Appendage Using AtriCure PROClip size 45MM (N/A Chest)  Patient Location: ICU  Anesthesia Type:General  Level of Consciousness: Patient remains intubated per anesthesia plan  Airway & Oxygen Therapy: Patient remains intubated per anesthesia plan  Post-op Assessment: Report given to RN and Post -op Vital signs reviewed and stable  Post vital signs: Reviewed and stable  Last Vitals:  Vitals Value Taken Time  BP 111/67   Temp    Pulse 85   Resp 14   SpO2 99     Last Pain:  Vitals:   02/19/20 0639  TempSrc:   PainSc: 0-No pain         Complications: No apparent anesthesia complications

## 2020-02-19 NOTE — Anesthesia Procedure Notes (Signed)
Central Venous Catheter Insertion Performed by: Roberts Gaudy, MD, anesthesiologist Start/End3/10/2020 6:50 AM, 02/19/2020 7:00 AM Patient location: Pre-op. Preanesthetic checklist: patient identified, IV checked, site marked, risks and benefits discussed, surgical consent, monitors and equipment checked, pre-op evaluation, timeout performed and anesthesia consent Lidocaine 1% used for infiltration and patient sedated Hand hygiene performed  and maximum sterile barriers used  Catheter size: 9 Fr Sheath introducer Procedure performed using ultrasound guided technique. Ultrasound Notes:anatomy identified, needle tip was noted to be adjacent to the nerve/plexus identified, no ultrasound evidence of intravascular and/or intraneural injection and image(s) printed for medical record Attempts: 1 Following insertion, line sutured and dressing applied. Post procedure assessment: blood return through all ports, free fluid flow and no air  Patient tolerated the procedure well with no immediate complications.

## 2020-02-20 ENCOUNTER — Inpatient Hospital Stay (HOSPITAL_COMMUNITY): Payer: BC Managed Care – PPO

## 2020-02-20 DIAGNOSIS — Z20822 Contact with and (suspected) exposure to covid-19: Secondary | ICD-10-CM | POA: Diagnosis not present

## 2020-02-20 DIAGNOSIS — D62 Acute posthemorrhagic anemia: Secondary | ICD-10-CM | POA: Diagnosis not present

## 2020-02-20 DIAGNOSIS — I081 Rheumatic disorders of both mitral and tricuspid valves: Secondary | ICD-10-CM | POA: Diagnosis not present

## 2020-02-20 DIAGNOSIS — I5032 Chronic diastolic (congestive) heart failure: Secondary | ICD-10-CM | POA: Diagnosis not present

## 2020-02-20 DIAGNOSIS — J9811 Atelectasis: Secondary | ICD-10-CM | POA: Diagnosis not present

## 2020-02-20 LAB — BASIC METABOLIC PANEL
Anion gap: 8 (ref 5–15)
Anion gap: 8 (ref 5–15)
BUN: 15 mg/dL (ref 8–23)
BUN: 18 mg/dL (ref 8–23)
CO2: 20 mmol/L — ABNORMAL LOW (ref 22–32)
CO2: 21 mmol/L — ABNORMAL LOW (ref 22–32)
Calcium: 7.5 mg/dL — ABNORMAL LOW (ref 8.9–10.3)
Calcium: 8 mg/dL — ABNORMAL LOW (ref 8.9–10.3)
Chloride: 110 mmol/L (ref 98–111)
Chloride: 105 mmol/L (ref 98–111)
Creatinine, Ser: 0.83 mg/dL (ref 0.61–1.24)
Creatinine, Ser: 1.05 mg/dL (ref 0.61–1.24)
GFR calc Af Amer: >60 mL/min (ref >60)
GFR calc Af Amer: >60 mL/min (ref >60)
GFR calc non Af Amer: >60 mL/min (ref >60)
GFR calc non Af Amer: >60 mL/min (ref >60)
Glucose, Bld: 133 mg/dL — ABNORMAL HIGH (ref 70–99)
Glucose, Bld: 132 mg/dL — ABNORMAL HIGH (ref 70–99)
Potassium: 4.7 mmol/L (ref 3.5–5.1)
Potassium: 5.1 mmol/L (ref 3.5–5.1)
Sodium: 138 mmol/L (ref 135–145)
Sodium: 134 mmol/L — ABNORMAL LOW (ref 135–145)

## 2020-02-20 LAB — GLUCOSE, CAPILLARY
Glucose-Capillary: 109 mg/dL — ABNORMAL HIGH (ref 70–99)
Glucose-Capillary: 110 mg/dL — ABNORMAL HIGH (ref 70–99)
Glucose-Capillary: 115 mg/dL — ABNORMAL HIGH (ref 70–99)
Glucose-Capillary: 117 mg/dL — ABNORMAL HIGH (ref 70–99)
Glucose-Capillary: 117 mg/dL — ABNORMAL HIGH (ref 70–99)
Glucose-Capillary: 117 mg/dL — ABNORMAL HIGH (ref 70–99)
Glucose-Capillary: 121 mg/dL — ABNORMAL HIGH (ref 70–99)
Glucose-Capillary: 125 mg/dL — ABNORMAL HIGH (ref 70–99)
Glucose-Capillary: 125 mg/dL — ABNORMAL HIGH (ref 70–99)
Glucose-Capillary: 130 mg/dL — ABNORMAL HIGH (ref 70–99)
Glucose-Capillary: 131 mg/dL — ABNORMAL HIGH (ref 70–99)
Glucose-Capillary: 131 mg/dL — ABNORMAL HIGH (ref 70–99)
Glucose-Capillary: 138 mg/dL — ABNORMAL HIGH (ref 70–99)

## 2020-02-20 LAB — TYPE AND SCREEN
ABO/RH(D): O POS
Antibody Screen: NEGATIVE
Unit division: 0
Unit division: 0

## 2020-02-20 LAB — CBC
HCT: 32.8 % — ABNORMAL LOW (ref 39.0–52.0)
HCT: 35.2 % — ABNORMAL LOW (ref 39.0–52.0)
Hemoglobin: 11 g/dL — ABNORMAL LOW (ref 13.0–17.0)
Hemoglobin: 11.6 g/dL — ABNORMAL LOW (ref 13.0–17.0)
MCH: 30.4 pg (ref 26.0–34.0)
MCH: 30.1 pg (ref 26.0–34.0)
MCHC: 33.5 g/dL (ref 30.0–36.0)
MCHC: 33 g/dL (ref 30.0–36.0)
MCV: 90.6 fL (ref 80.0–100.0)
MCV: 91.4 fL (ref 80.0–100.0)
Platelets: 104 10*3/uL — ABNORMAL LOW (ref 150–400)
Platelets: 87 10*3/uL — ABNORMAL LOW (ref 150–400)
RBC: 3.62 MIL/uL — ABNORMAL LOW (ref 4.22–5.81)
RBC: 3.85 MIL/uL — ABNORMAL LOW (ref 4.22–5.81)
RDW: 13.2 % (ref 11.5–15.5)
RDW: 13.6 % (ref 11.5–15.5)
WBC: 17.7 10*3/uL — ABNORMAL HIGH (ref 4.0–10.5)
WBC: 19.4 10*3/uL — ABNORMAL HIGH (ref 4.0–10.5)
nRBC: 0 % (ref 0.0–0.2)
nRBC: 0 % (ref 0.0–0.2)

## 2020-02-20 LAB — BPAM RBC
Blood Product Expiration Date: 202104132359
Blood Product Expiration Date: 202104132359
Unit Type and Rh: 5100
Unit Type and Rh: 5100

## 2020-02-20 LAB — MAGNESIUM
Magnesium: 2.5 mg/dL — ABNORMAL HIGH (ref 1.7–2.4)
Magnesium: 2.6 mg/dL — ABNORMAL HIGH (ref 1.7–2.4)

## 2020-02-20 LAB — SURGICAL PATHOLOGY

## 2020-02-20 MED ORDER — ASPIRIN EC 325 MG PO TBEC
325.0000 mg | DELAYED_RELEASE_TABLET | Freq: Every day | ORAL | Status: AC
  Administered 2020-02-20: 325 mg via ORAL
  Filled 2020-02-20: qty 1

## 2020-02-20 MED ORDER — WARFARIN SODIUM 2.5 MG PO TABS
2.5000 mg | ORAL_TABLET | Freq: Every day | ORAL | Status: DC
  Administered 2020-02-20 – 2020-02-22 (×3): 2.5 mg via ORAL
  Filled 2020-02-20 (×3): qty 1

## 2020-02-20 MED ORDER — ASPIRIN EC 81 MG PO TBEC
81.0000 mg | DELAYED_RELEASE_TABLET | Freq: Every day | ORAL | Status: DC
  Administered 2020-02-21 – 2020-02-24 (×4): 81 mg via ORAL
  Filled 2020-02-20 (×4): qty 1

## 2020-02-20 MED ORDER — WARFARIN - PHYSICIAN DOSING INPATIENT: Freq: Every day | Status: DC

## 2020-02-20 MED ORDER — INSULIN ASPART 100 UNIT/ML ~~LOC~~ SOLN
0.0000 [IU] | SUBCUTANEOUS | Status: DC
  Administered 2020-02-21: 2 [IU] via SUBCUTANEOUS

## 2020-02-20 MED ORDER — KETOROLAC TROMETHAMINE 15 MG/ML IJ SOLN
15.0000 mg | Freq: Four times a day (QID) | INTRAMUSCULAR | Status: DC | PRN
  Administered 2020-02-20 – 2020-02-22 (×4): 15 mg via INTRAVENOUS
  Filled 2020-02-20 (×4): qty 1

## 2020-02-20 MED ORDER — COUMADIN BOOK
Freq: Once | Status: AC
  Filled 2020-02-20: qty 1

## 2020-02-20 MED ORDER — ENOXAPARIN SODIUM 30 MG/0.3ML ~~LOC~~ SOLN
30.0000 mg | Freq: Every day | SUBCUTANEOUS | Status: DC
  Administered 2020-02-21 – 2020-02-23 (×3): 30 mg via SUBCUTANEOUS
  Filled 2020-02-20 (×3): qty 0.3

## 2020-02-20 MED FILL — Potassium Chloride Inj 2 mEq/ML: INTRAVENOUS | Qty: 40 | Status: AC

## 2020-02-20 MED FILL — Lidocaine HCl Local Preservative Free (PF) Inj 2%: INTRAMUSCULAR | Qty: 15 | Status: AC

## 2020-02-20 MED FILL — Heparin Sodium (Porcine) Inj 1000 Unit/ML: INTRAMUSCULAR | Qty: 30 | Status: AC

## 2020-02-20 NOTE — Discharge Summary (Signed)
. Physician Discharge Summary        [address]       [place] [address]             [phone]       Patient ID: Daniel Browning MRN: [id number] DOB/AGE: Jan 11, 1956 64 y.o.  Admit date: 02/19/2020 Discharge date: 02/24/2020  Admission Diagnoses:  Patient Active Problem List   Diagnosis Date Noted  . Mitral valve prolapse   . GERD (gastroesophageal reflux disease)   . Coronary artery disease involving native coronary artery of native heart with angina pectoris ([place])   . Stable angina ([place]) 01/07/2020  . Persistent atrial fibrillation ([place])   . Nonrheumatic mitral valve regurgitation     Discharge Diagnoses:  Principal Problem:   S/P mitral valve repair + CABG + maze procedure Active Problems:   Stable angina ([facility])   Persistent atrial fibrillation ([facility])   Nonrheumatic mitral valve regurgitation   Coronary artery disease involving native coronary artery of native heart with angina pectoris ([facility])   Mitral valve prolapse   GERD (gastroesophageal reflux disease)   S/P CABG x 1   S/P Maze operation for atrial fibrillation   Discharged Condition: good  HPI:  Patient is a 64 yo male with history of GERD, mitral valve prolapse with life-long heart murmur and CAD referred for surgical consultation to discuss treatment options for management of recently diagnosed severe, symptomatic primary mitral regurgitation, single-vessel coronary artery disease with stable angina pectoris and presumably recent onset persistent atrial fibrillation.  Patient states that he has been told that he had a heart murmur since his teenage years. He was first evaluated by a cardiologist in 2003 when he had an abnormal stress test performed for evaluation of chest pain. Catheterization performed at that time reportedly revealed non-obstructive coronary artery disease. Patient states that he also had an echocardiogram performed at that time that revealed mitral valve prolapse which  he was told was not worrisome but needed to be watched intermittently.  Patient states that approximately 1 year ago he began to experience intermittent chest discomfort and SOB with exertion. He initially attributed these symptoms as reflux, but they persisted and gradually got worse. He denies any prolonged episodes of chest pain and symptoms have always been associated with exertion and relieved by rest. He denies PND, orthopnea, or lower extremity edema. He also denies any symptoms of palpitations, dizzy spells or syncope.  Patient eventually presented to his primary care physician last November who discovered a new prominent systolic murmur on examination. He was referred for cardiology consult and evaluated by Dr. Earnie Browning on January 07, 2020. He was found to be in atrial fibrillation at the time and started on oral Xarelto for anticoagulation. Transthoracic echocardiogram revealed mitral valve prolapse with severe mitral regurgitation and low normal LV systolic function with ejection fraction reported 55% in the setting of severe mitral regurgitation. The patient was evaluated by Dr. Gerrit Browning at [facility] for possible mitral valve repair who recommended diagnostic cardiac catheterization. TEE and diagnostic catheterization were performed February 03, 2020. TEE confirmed the presence of mitral valve prolapse with an obvious flail segment of the posterior leaflet and severe mitral regurgitation. There was severe left atrial enlargement. Left ventricular function was reported normal with ejection fraction 55-60%. Diagnostic cardiac catheterization revealed severe coronary artery disease with chronic occlusion of the mid left anterior descending coronary artery with mild non-obstructive disease in the left circumflex and right coronary arteries. There was mild pulmonary hypertension  and large V waves on wedge tracing consistent with severe mitral regurgitation. The patient was referred for a  second surgical consultation.  Patient is married and lives locally in [place] with his wife andanadult daughter who is disabled. He has 2 other adult daughters who both live and work in [place]. He works full-time at [employer] in [place]. He previously owned and operated a Engineer, drilling. The patient does not exercise on a regular basis but he reports no significant physical limitations. He enjoys playing golf although he has not been playing as much recently. He spends a lot of time working around the house when he is not at work.   Patient is a 64 yo male with history of GERD, mitral valve prolapse with life-long heart murmur and CADwho returns to the office with tentative plans to proceed with elective surgical intervention later this week for managementof recently diagnosed severe, symptomatic primary mitral regurgitation, single-vessel coronary artery disease with stable angina pectoris and presumably recent onset persistent atrial fibrillation. He was originally seen in consultation on February 05, 2020. He reports no new problems or complaints over the last few weeks. He stopped taking Xarelto 2 days ago in anticipation of his upcoming surgery.  Hospital Course:   Mr. Daniel Browning underwent a MV Repair, CABG x 1 (LIMA to LAD), and MAZE with Dr. Roxy Browning on 02/19/2020. He tolerated the procedure well and was transferred to the surgical ICU for continued care. He was extubated in a timely manner. POD 1 we discontinued his arterial line and swan ganz catheter. We were weaning off milrinone and neo. His biggest complaint was post-op surgical pain therefore we added Toradol. We initiated low-dose coumadin for valve thrombus prophylaxis. On POD 2 he had some nausea with left sided incisional pain. We continued reglan for GI motility and discontinued his chest tubes and lines. We initiated a diuretic regimen for fluid overload. POD 3 we added metoprolol and he was stable to transfer for 4E  for continued care. POD 4 he remained nauseated therefore we added phenergan and miralax. He had not had a bowel movement since surgery which was most likely contributing to his nausea and overall not feeling well. He did tolerate some oral food and a BRAT diet was suggested. He was tolerating a normal diet and his nausea subsided when he had a bowel movement. Would recommend daily miralax to prevent constipation. Today, he is tolerating room air, his incisions are healing well, he is ambulating with limited assistance, and he is ready for discharge home.   He has follow-up for his INR to be checked on 3/17 with Dr. Bonney Roussel office.   Consults: None  Significant Diagnostic Studies:   CLINICAL DATA:  Status post bypass surgery and MVR.  EXAM: PORTABLE CHEST 1 VIEW  COMPARISON:  02/19/2020  FINDINGS: The endotracheal tube and NG tubes have been removed. The Swan-Ganz catheter is stable with its tip in the proximal right pulmonary artery. Stable left chest tube and mediastinal drain tubes.  Slightly lower lung volumes with streaky bibasilar atelectasis. No edema, effusions or pneumothorax.  IMPRESSION: 1. Removal of endotracheal and NG tubes. 2. Remaining support apparatus is stable. 3. Slightly lower lung volumes with streaky bibasilar atelectasis.   Electronically Signed   By: Marijo Sanes M.D.   On: 02/20/2020 08:51   Treatments:  CARDIOTHORACIC SURGERY OPERATIVE NOTE  Date of Procedure:                02/19/2020  Preoperative Diagnosis:  Severe Mitral Regurgitation  Severe Single Vessel Coronary Artery Disease  Persistent Atrial Fibrillation  Postoperative Diagnosis:    Same  Procedure:       Mitral Valve Repair             Complex valvuloplasty including artificial Gore-tex neochord placement x8             Decalcification of posterior leaflet and posterior annulus             Sliding leaflet plasty             Suture plication of  anterior commissure and P1/P2             Sorin Memo 4D ring annuloplasty (size 82mm, ref #[id number], serial [id number])   Coronary Artery Bypass Grafting x 1              Left Internal Mammary Artery to Distal Left Anterior Descending Coronary Artery   Maze Procedure              complete bilateral atrial lesion set using bipolar radiofrequency and cryothermy ablation             clipping of left atrial appendage (Atriclip size 23mm)               Surgeon:        Valentina Gu. Daniel Manns, MD  Assistant:       Nicholes Rough, PA-C  Anesthesia:    Roberts Gaudy, MD  Operative Findings: ? 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 Multiple elongated and single ruptured primary chordae tendinae ? Severe calcification involving base of prolapsing P2 segment of posterior leaflet ? Type II dysfunction with severe mitral regurgitation ? Normal left ventricular systolic function ? Good quality left internal mammary artery conduit for grafting ? Good quality distal left anterior descending coronary artery for grafting ? No residual mitral regurgitation after successful valve repair   Discharge Exam: Blood pressure 124/79, pulse 90, temperature 97.7 F (36.5 C), temperature source Oral, resp. rate 16, height 5\' 10"  (1.778 m), weight 86.4 kg, SpO2 100 %.   General appearance: alert, cooperative and no distress Heart: regular rate and rhythm, S1, S2 normal, no murmur, click, rub or gallop Lungs: clear to auscultation bilaterally Abdomen: soft, non-tender; bowel sounds normal; no masses,  no organomegaly Extremities: extremities normal, atraumatic, no cyanosis or edema Wound: clean and dry   Disposition: Discharge disposition: 01-Home or Self Care       Discharge Instructions    Amb Referral to Cardiac Rehabilitation   Complete by: As directed    Diagnosis:  CABG Valve Repair     Valve: Mitral   CABG X ___: 1   After initial evaluation and assessments  completed: Virtual Based Care may be provided alone or in conjunction with Phase 2 Cardiac Rehab based on patient barriers.: Yes     Allergies as of 02/24/2020   No Known Allergies     Medication List    STOP taking these medications   nitroGLYCERIN 0.4 MG SL tablet Commonly known as: NITROSTAT   Potassium 99 MG Tabs   rivaroxaban 20 MG Tabs tablet Commonly known as: Xarelto     TAKE these medications   acetaminophen 500 MG tablet Commonly known as: TYLENOL Take 2 tablets (1,000 mg total) by mouth every 6 (six) hours.   aspirin 81 MG EC tablet Take 1 tablet (81 mg total) by mouth daily. Start taking on: February 25, 2020  furosemide 40 MG tablet Commonly known as: Lasix Take 1 tablet (40 mg total) by mouth daily for 3 days. What changed:   medication strength  how much to take   metoprolol tartrate 25 MG tablet Commonly known as: LOPRESSOR Take 0.5 tablets (12.5 mg total) by mouth 2 (two) times daily.   omeprazole 20 MG tablet Commonly known as: PRILOSEC OTC Take 20 mg by mouth daily.   Potassium Chloride ER 20 MEQ Tbcr Take 20 mEq by mouth daily for 3 days.   warfarin 5 MG tablet Commonly known as: COUMADIN Take 1 tablet (5 mg total) by mouth daily at 6 PM.      Follow-up Information    [name], MD. Call in 1 day(s).   Specialty: Family Medicine Contact information: [address] [place] [address] [phone]         [facility] Follow up.   Specialty: Cardiology Why: follow-up Contact information: [address] [id number] [place] [phone]       [facility] Follow up.   Specialty: Cardiothoracic Surgery Why: Your routine follow-up appointment is on 4/13 at 1pm. Please arrive at 12:30pm for a chest xray located at [facility] which is on the first floor of our building. Contact information: [place], [place] [phone]       Nigel Mormon, MD. Call in 1 day(s).   Specialties: Cardiology, Radiology Why:   Your coumadin follow-up appointment is on 3/17 at 10:15am.   Your follow-up appointment is on March 25th at 10:00am. Please bring your hospital paperwork.  Contact information: [place] [address] [phone]          The patient has been discharged on:   1.Beta Blocker:  Yes [ yes  ]                              No   [   ]                              If No, reason:  2.Ace Inhibitor/ARB: Yes [   ]                                     No  [ no ]                                     If No, reason:  3.Statin:   Yes [   ]                  No  [ no  ]                  If No, reason:  4.Ecasa:  Yes  [ yes  ]                  No   [   ]                  If No, reason:   Signed: Elgie Collard 02/24/2020, 9:05 AM

## 2020-02-20 NOTE — Progress Notes (Addendum)
[facility] DAILY ICU PROGRESS NOTE                   [address]            [place], [address]          [phone]   1 Day Post-Op Procedure(s) (LRB): MITRAL VALVE REPAIR (MVR) USING MEMO 4D RING SIZE 34MM (N/A) CORONARY ARTERY BYPASS GRAFTING (CABG) x ONE, USING LEFT INTERNAL MAMMARY ARTERY (N/A) MAZE (N/A) TRANSESOPHAGEAL ECHOCARDIOGRAM (TEE) (N/A) Clipping Of Atrial Appendage Using AtriCure PROClip size 45MM (N/A)  Total Length of Stay:  LOS: 1 day   Subjective: Biggest complaint is incisional pain today. Pain is mostly over his left chest wall.   Objective: Vital signs in last 24 hours: Temp:  [97.5 F (36.4 C)-99.1 F (37.3 C)] 97.7 F (36.5 C) (03/12 0700) Pulse Rate:  [56-81] 72 (03/12 0700) Cardiac Rhythm: Atrial paced (03/11 2000) Resp:  [8-37] 21 (03/12 0700) BP: (94-116)/(56-88) 102/67 (03/12 0700) SpO2:  [93 %-100 %] 97 % (03/12 0700) Arterial Line BP: (88-117)/(54-69) 112/58 (03/12 0700) FiO2 (%):  [40 %-70 %] 40 % (03/11 1845) Weight:  [91.5 kg] 91.5 kg (03/12 0500)  Filed Weights   02/19/20 0601 02/20/20 0500  Weight: 86.2 kg 91.5 kg    Weight change: 5.316 kg   Hemodynamic parameters for last 24 hours: PAP: (19-38)/(5-23) 28/12 CO:  [3.6 L/min-5 L/min] 5 L/min CI:  [1.7 L/min/m2-2.5 L/min/m2] 2.5 L/min/m2  Intake/Output from previous day: 03/11 0701 - 03/12 0700 In: 6905.6 [P.O.:690; I.V.:4058.7; Blood:915; IV Piggyback:1241.9] Out: [id number] [Urine:2530; Blood:1525; Chest Tube:720]  Intake/Output this shift: No intake/output data recorded.  Current Meds: Scheduled Meds: . acetaminophen  1,000 mg Oral Q6H  . aspirin EC  325 mg Oral Daily  . [START ON 02/21/2020] aspirin EC  81 mg Oral Daily  . bisacodyl  10 mg Oral Daily   Or  . bisacodyl  10 mg Rectal Daily  . Chlorhexidine Gluconate Cloth  6 each Topical Daily  . docusate sodium  200 mg Oral Daily  . [START ON 02/21/2020] enoxaparin (LOVENOX) injection  30 mg Subcutaneous QHS    . insulin aspart  0-24 Units Subcutaneous Q4H  . mouth rinse  15 mL Mouth Rinse BID  . [START ON 02/21/2020] pantoprazole  40 mg Oral Daily  . sodium chloride flush  10-40 mL Intracatheter Q12H  . sodium chloride flush  3 mL Intravenous Q12H  . warfarin  2.5 mg Oral q1800  . Warfarin - Physician Dosing Inpatient   Does not apply q1800   Continuous Infusions: . sodium chloride    . albumin human 12.5 g (02/19/20 1809)  . cefUROXime (ZINACEF)  IV 1.5 g (02/20/20 0617)  . insulin 0.7 mL/hr at 02/20/20 0700  . lactated ringers    . lactated ringers 20 mL/hr at 02/20/20 0700  . milrinone 0.25 mcg/kg/min (02/20/20 0700)  . phenylephrine (NEO-SYNEPHRINE) Adult infusion 20 mcg/min (02/20/20 0700)   PRN Meds:.albumin human, dextrose, metoprolol tartrate, morphine injection, ondansetron (ZOFRAN) IV, oxyCODONE, sodium chloride flush, sodium chloride flush, traMADol  General appearance: alert, cooperative and no distress Heart: regular rate and rhythm, S1, S2 normal, no murmur, click, rub or gallop Lungs: clear to auscultation bilaterally and diminished in the lower lobes Abdomen: soft, non-tender; bowel sounds normal; no masses,  no organomegaly Extremities: extremities normal, atraumatic, no cyanosis or edema Wound: clean and dry covered with a sterile dressing  Lab Results: CBC: Recent Labs    02/19/20  2155 02/20/20 0408  WBC 17.3* 17.7*  HGB 11.2* 11.0*  HCT 32.9* 32.8*  PLT PLATELET CLUMPS NOTED ON SMEAR, UNABLE TO ESTIMATE 104*   BMET:  Recent Labs    02/19/20 2155 02/20/20 0408  NA 136 138  K 4.3 4.7  CL 109 110  CO2 17* 20*  GLUCOSE 158* 133*  BUN 16 15  CREATININE 0.82 0.83  CALCIUM 7.1* 7.5*    CMET: Lab Results  Component Value Date   WBC 17.7 (H) 02/20/2020   HGB 11.0 (L) 02/20/2020   HCT 32.8 (L) 02/20/2020   PLT 104 (L) 02/20/2020   GLUCOSE 133 (H) 02/20/2020   ALT 19 02/16/2020   AST 22 02/16/2020   NA 138 02/20/2020   K 4.7 02/20/2020   CL 110  02/20/2020   CREATININE 0.83 02/20/2020   BUN 15 02/20/2020   CO2 20 (L) 02/20/2020   INR 1.5 (H) 02/19/2020   HGBA1C 5.2 02/16/2020      PT/INR:  Recent Labs    02/19/20 1612  LABPROT 18.3*  INR 1.5*   Radiology: [facility] Chest Port 1 View  Result Date: 02/19/2020 CLINICAL DATA:  Status post cardiac surgery EXAM: PORTABLE CHEST 1 VIEW COMPARISON:  02/16/2020 FINDINGS: Postsurgical changes are now seen consistent with the recent surgery. Endotracheal tube, mediastinal drain, pericardial drain and left thoracostomy catheter are noted in satisfactory position. Gastric catheter is noted with the tip at the gastroesophageal junction in the proximal side port in the distal esophagus. This could be advanced several cm for better positioning. Swan-Ganz catheter is noted at the level of the pulmonary valve. The lungs are well aerated bilaterally. Mild vascular congestion is seen. No pneumothorax is noted. No sizable effusion is seen. IMPRESSION: Postsurgical changes with tubes and lines as described above. The gastric catheter could be advanced further into the stomach as clinically necessary. Electronically Signed   By: Inez Catalina M.D.   On: 02/19/2020 16:31     Assessment/Plan: S/P Procedure(s) (LRB): MITRAL VALVE REPAIR (MVR) USING MEMO 4D RING SIZE 34MM (N/A) CORONARY ARTERY BYPASS GRAFTING (CABG) x ONE, USING LEFT INTERNAL MAMMARY ARTERY (N/A) MAZE (N/A) TRANSESOPHAGEAL ECHOCARDIOGRAM (TEE) (N/A) Clipping Of Atrial Appendage Using AtriCure PROClip size 45MM (N/A)     1. CV- NSR in the 70s. Pacer turned off. BP stable. MAPs are in the 70s.  Weaning Neo as tolerated. Milrinone with a slow wean today. 2. Pulm-tolerating 4L [place]. Weaning as able. Encouraged incentive spirometer use hourly.  3. Renal-creatinine 0.83, electrolytes okay.  4. H and H 11.0/32.8, expected acute blood loss anemia 5. Endo-blood glucose well controlled on current regimen 6. Continue low-dose coumadin. Follow INR. 7.  Continue post-op antibiotics.  Plan: Discontinue swan-ganz catheter and arterial line. Continue chest tubes and foley for now. OOB to chair and ambulate if able today. Working on pain control and added Toradol.     Daniel Browning 02/20/2020 8:14 AM   I have seen and examined the patient and agree with the assessment and plan as outlined.  Doing very well POD1.  Maintaining NSR w/ stable hemodynamics on low dose milrinone and neo drips.  Mobilize.  Wean milrinone off.  Wean neo as tolerated.  Hold diuretics until BP improved off neo.  Start Coumadin slowly.  Likely D/C tubes tomorrow, depending on output   Daniel Alberts, MD 02/20/2020 9:08 AM

## 2020-02-21 ENCOUNTER — Inpatient Hospital Stay (HOSPITAL_COMMUNITY): Payer: BC Managed Care – PPO

## 2020-02-21 DIAGNOSIS — Z952 Presence of prosthetic heart valve: Secondary | ICD-10-CM | POA: Diagnosis not present

## 2020-02-21 DIAGNOSIS — J90 Pleural effusion, not elsewhere classified: Secondary | ICD-10-CM | POA: Diagnosis not present

## 2020-02-21 DIAGNOSIS — R918 Other nonspecific abnormal finding of lung field: Secondary | ICD-10-CM | POA: Diagnosis not present

## 2020-02-21 LAB — GLUCOSE, CAPILLARY
Glucose-Capillary: 115 mg/dL — ABNORMAL HIGH (ref 70–99)
Glucose-Capillary: 134 mg/dL — ABNORMAL HIGH (ref 70–99)
Glucose-Capillary: 115 mg/dL — ABNORMAL HIGH (ref 70–99)
Glucose-Capillary: 118 mg/dL — ABNORMAL HIGH (ref 70–99)
Glucose-Capillary: 109 mg/dL — ABNORMAL HIGH (ref 70–99)

## 2020-02-21 LAB — BASIC METABOLIC PANEL
Anion gap: 8 (ref 5–15)
BUN: 20 mg/dL (ref 8–23)
CO2: 23 mmol/L (ref 22–32)
Calcium: 8.1 mg/dL — ABNORMAL LOW (ref 8.9–10.3)
Chloride: 104 mmol/L (ref 98–111)
Creatinine, Ser: 0.96 mg/dL (ref 0.61–1.24)
GFR calc Af Amer: >60 mL/min (ref >60)
GFR calc non Af Amer: >60 mL/min (ref >60)
Glucose, Bld: 135 mg/dL — ABNORMAL HIGH (ref 70–99)
Potassium: 5 mmol/L (ref 3.5–5.1)
Sodium: 135 mmol/L (ref 135–145)

## 2020-02-21 LAB — CBC
HCT: 33.8 % — ABNORMAL LOW (ref 39.0–52.0)
Hemoglobin: 11.1 g/dL — ABNORMAL LOW (ref 13.0–17.0)
MCH: 30 pg (ref 26.0–34.0)
MCHC: 32.8 g/dL (ref 30.0–36.0)
MCV: 91.4 fL (ref 80.0–100.0)
Platelets: 77 10*3/uL — ABNORMAL LOW (ref 150–400)
RBC: 3.7 MIL/uL — ABNORMAL LOW (ref 4.22–5.81)
RDW: 13.6 % (ref 11.5–15.5)
WBC: 16.7 10*3/uL — ABNORMAL HIGH (ref 4.0–10.5)
nRBC: 0 % (ref 0.0–0.2)

## 2020-02-21 LAB — PROTIME-INR
INR: 1.3 — ABNORMAL HIGH (ref 0.8–1.2)
Prothrombin Time: 16.3 seconds — ABNORMAL HIGH (ref 11.4–15.2)

## 2020-02-21 MED ORDER — METOCLOPRAMIDE HCL 5 MG/ML IJ SOLN
5.0000 mg | Freq: Three times a day (TID) | INTRAMUSCULAR | Status: AC
  Administered 2020-02-21 (×3): 5 mg via INTRAVENOUS
  Filled 2020-02-21 (×3): qty 2

## 2020-02-21 MED ORDER — FUROSEMIDE 10 MG/ML IJ SOLN
40.0000 mg | Freq: Two times a day (BID) | INTRAMUSCULAR | Status: DC
  Administered 2020-02-21 – 2020-02-24 (×7): 40 mg via INTRAVENOUS
  Filled 2020-02-21 (×7): qty 4

## 2020-02-21 NOTE — Progress Notes (Signed)
2 Days Post-Op Procedure(s) (LRB): MITRAL VALVE REPAIR (MVR) USING MEMO 4D RING SIZE 34MM (N/A) CORONARY ARTERY BYPASS GRAFTING (CABG) x ONE, USING LEFT INTERNAL MAMMARY ARTERY (N/A) MAZE (N/A) TRANSESOPHAGEAL ECHOCARDIOGRAM (TEE) (N/A) Clipping Of Atrial Appendage Using AtriCure PROClip size 45MM (N/A) Subjective: Nauseated and left chest pain-incisional  Objective: Vital signs in last 24 hours: Temp:  [97.3 F (36.3 C)-98 F (36.7 C)] 97.7 F (36.5 C) (03/12 2336) Pulse Rate:  [39-81] 77 (03/13 0630) Cardiac Rhythm: Normal sinus rhythm (03/13 0400) Resp:  [13-29] 15 (03/13 0630) BP: (92-146)/(70-92) 125/84 (03/13 0630) SpO2:  [93 %-100 %] 93 % (03/13 0630) Arterial Line BP: (121-130)/(53-64) 121/53 (03/12 1200) Weight:  [91.5 kg] 91.5 kg (03/13 0500)  Hemodynamic parameters for last 24 hours: PAP: (22-27)/(9-15) 22/12  Intake/Output from previous day: 03/12 0701 - 03/13 0700 In: 683.9 [P.O.:400; I.V.:283.9] Out: 1307 [Urine:697; Chest Tube:610] Intake/Output this shift: No intake/output data recorded.  General appearance: alert and cooperative Neurologic: intact Heart: regular rate and rhythm, S1, S2 normal, no murmur, click, rub or gallop Lungs: clear to auscultation bilaterally Abdomen: mildly distended, nontender Extremities: edema 2+ Wound: dressed, dry  Lab Results: Recent Labs    02/20/20 1701 02/21/20 0300  WBC 19.4* 16.7*  HGB 11.6* 11.1*  HCT 35.2* 33.8*  PLT 87* 77*   BMET:  Recent Labs    02/20/20 1701 02/21/20 0300  NA 134* 135  K 5.1 5.0  CL 105 104  CO2 21* 23  GLUCOSE 132* 135*  BUN 18 20  CREATININE 1.05 0.96  CALCIUM 8.0* 8.1*    PT/INR:  Recent Labs    02/21/20 0300  LABPROT 16.3*  INR 1.3*   ABG    Component Value Date/Time   PHART 7.341 (L) 02/19/2020 2024   HCO3 19.6 (L) 02/19/2020 2024   TCO2 21 (L) 02/19/2020 2024   ACIDBASEDEF 6.0 (H) 02/19/2020 2024   O2SAT 97.0 02/19/2020 2024   CBG (last 3)  Recent Labs   02/20/20 1939 02/20/20 2333 02/21/20 0457  GLUCAP 109* 110* 115*    Assessment/Plan: S/P Procedure(s) (LRB): MITRAL VALVE REPAIR (MVR) USING MEMO 4D RING SIZE 34MM (N/A) CORONARY ARTERY BYPASS GRAFTING (CABG) x ONE, USING LEFT INTERNAL MAMMARY ARTERY (N/A) MAZE (N/A) TRANSESOPHAGEAL ECHOCARDIOGRAM (TEE) (N/A) Clipping Of Atrial Appendage Using AtriCure PROClip size 45MM (N/A) Mobilize Diuresis d/c tubes/lines reglan for nausea and gastric bubble on CXR   LOS: 2 days    Daniel Browning 02/21/2020

## 2020-02-22 ENCOUNTER — Inpatient Hospital Stay (HOSPITAL_COMMUNITY): Payer: BC Managed Care – PPO

## 2020-02-22 DIAGNOSIS — Z48812 Encounter for surgical aftercare following surgery on the circulatory system: Secondary | ICD-10-CM | POA: Diagnosis not present

## 2020-02-22 DIAGNOSIS — J9811 Atelectasis: Secondary | ICD-10-CM | POA: Diagnosis not present

## 2020-02-22 LAB — GLUCOSE, CAPILLARY
Glucose-Capillary: 142 mg/dL — ABNORMAL HIGH (ref 70–99)
Glucose-Capillary: 84 mg/dL (ref 70–99)

## 2020-02-22 LAB — PROTIME-INR
INR: 1.2 (ref 0.8–1.2)
Prothrombin Time: 15.2 seconds (ref 11.4–15.2)

## 2020-02-22 LAB — BASIC METABOLIC PANEL
Anion gap: 9 (ref 5–15)
BUN: 18 mg/dL (ref 8–23)
CO2: 27 mmol/L (ref 22–32)
Calcium: 7.9 mg/dL — ABNORMAL LOW (ref 8.9–10.3)
Chloride: 99 mmol/L (ref 98–111)
Creatinine, Ser: 0.75 mg/dL (ref 0.61–1.24)
GFR calc Af Amer: >60 mL/min (ref >60)
GFR calc non Af Amer: >60 mL/min (ref >60)
Glucose, Bld: 121 mg/dL — ABNORMAL HIGH (ref 70–99)
Potassium: 3.6 mmol/L (ref 3.5–5.1)
Sodium: 135 mmol/L (ref 135–145)

## 2020-02-22 LAB — CBC
HCT: 31.1 % — ABNORMAL LOW (ref 39.0–52.0)
Hemoglobin: 10.4 g/dL — ABNORMAL LOW (ref 13.0–17.0)
MCH: 30.6 pg (ref 26.0–34.0)
MCHC: 33.4 g/dL (ref 30.0–36.0)
MCV: 91.5 fL (ref 80.0–100.0)
Platelets: 79 10*3/uL — ABNORMAL LOW (ref 150–400)
RBC: 3.4 MIL/uL — ABNORMAL LOW (ref 4.22–5.81)
RDW: 13.6 % (ref 11.5–15.5)
WBC: 10.1 10*3/uL (ref 4.0–10.5)
nRBC: 0 % (ref 0.0–0.2)

## 2020-02-22 MED ORDER — METOPROLOL TARTRATE 12.5 MG HALF TABLET
12.5000 mg | ORAL_TABLET | Freq: Two times a day (BID) | ORAL | Status: DC
  Administered 2020-02-22 – 2020-02-24 (×5): 12.5 mg via ORAL
  Filled 2020-02-22 (×5): qty 1

## 2020-02-22 MED ORDER — POTASSIUM CHLORIDE CRYS ER 20 MEQ PO TBCR
20.0000 meq | EXTENDED_RELEASE_TABLET | ORAL | Status: AC
  Administered 2020-02-22 (×3): 20 meq via ORAL
  Filled 2020-02-22 (×3): qty 1

## 2020-02-22 NOTE — Progress Notes (Signed)
Sarah RN aware of order to d/c cvc 

## 2020-02-22 NOTE — Progress Notes (Signed)
Pt arrived from [facility] via wheelchair. Pt c/a/ox4. PT denies complaints. Pt vitals stable. CHG bath given. Telebox 20 applied/ccmd notified. Pt oriented to room and call bell within reach. Will continue to monitor. Jerald Kief, RN

## 2020-02-22 NOTE — Progress Notes (Signed)
Daniel Browning is a 64 y.o. male patient.  1. Coronary artery disease involving native heart without angina pectoris, unspecified vessel or lesion type   2. Mitral valve insufficiency, unspecified etiology   3. S/P MVR (mitral valve repair)   4. Nonrheumatic mitral valve regurgitation   5. Atelectasis   6. History of open heart surgery     Past Medical History:  Diagnosis Date  . Atrial fibrillation ([place])   . Colon polyps   . Coronary artery disease   . GERD (gastroesophageal reflux disease)   . Heart murmur   . Mitral valve prolapse   . Nonrheumatic mitral valve regurgitation   . Persistent atrial fibrillation ([place])   . S/P CABG x 1 02/19/2020   LIMA to LAD  . S/P Maze operation for atrial fibrillation 02/19/2020   Complete bilateral atrial lesion set using bipolar radiofrequency ablation and cryothermy with clipping of LA appendage via median sternotomy approach  . S/P mitral valve repair 02/19/2020   Complex valvuloplasty including artificial Gore-tex neochord placement x8, decalcification of posterior leaflet and posterior annulus with sliding leaflet plasty and 34 mm Sorin Memo 4D ring annuloplasty    Current Facility-Administered Medications  Medication Dose Route Frequency Provider Last Rate Last Admin  . 0.9 %  sodium chloride infusion  250 mL Intravenous Continuous Conte, Tessa N, PA-C      . acetaminophen (TYLENOL) tablet 1,000 mg  1,000 mg Oral Q6H Conte, Tessa N, PA-C   1,000 mg at 02/22/20 [id number]  . aspirin EC tablet 81 mg  81 mg Oral Daily Rexene Alberts, MD   81 mg at 02/21/20 1008  . bisacodyl (DULCOLAX) EC tablet 10 mg  10 mg Oral Daily Nicholes Rough N, PA-C   10 mg at 02/21/20 1008   Or  . bisacodyl (DULCOLAX) suppository 10 mg  10 mg Rectal Daily Harriet Pho, Tessa N, PA-C      . Chlorhexidine Gluconate Cloth 2 % PADS 6 each  6 each Topical Daily Rexene Alberts, MD   6 each at 02/21/20 1000  . dextrose 50 % solution 0-50 mL  0-50 mL Intravenous PRN Harriet Pho, Tessa N, PA-C       . docusate sodium (COLACE) capsule 200 mg  200 mg Oral Daily Conte, Tessa N, PA-C   200 mg at 02/21/20 1008  . enoxaparin (LOVENOX) injection 30 mg  30 mg Subcutaneous QHS Rexene Alberts, MD   30 mg at 02/21/20 2145  . furosemide (LASIX) injection 40 mg  40 mg Intravenous BID Wonda Olds, MD   40 mg at 02/22/20 0801  . insulin aspart (novoLOG) injection 0-24 Units  0-24 Units Subcutaneous Q4H Rexene Alberts, MD   2 Units at 02/21/20 [phone]  . insulin regular, human (MYXREDLIN) 100 units/ 100 mL infusion   Intravenous Continuous Elgie Collard, PA-C   Stopped at 02/20/20 1455  . ketorolac (TORADOL) 15 MG/ML injection 15 mg  15 mg Intravenous Q6H PRN Elgie Collard, PA-C   15 mg at 02/22/20 [id number]  . lactated ringers infusion   Intravenous Continuous Conte, Tessa N, PA-C      . lactated ringers infusion   Intravenous Continuous Elgie Collard, [place]   Stopped at 02/20/20 1805  . MEDLINE mouth rinse  15 mL Mouth Rinse BID Rexene Alberts, MD   15 mL at 02/21/20 1008  . metoprolol tartrate (LOPRESSOR) injection 2.5-5 mg  2.5-5 mg Intravenous Q2H PRN Elgie Collard, PA-C      .  metoprolol tartrate (LOPRESSOR) tablet 12.5 mg  12.5 mg Oral BID Wonda Olds, MD      . ondansetron (ZOFRAN) injection 4 mg  4 mg Intravenous Q6H PRN Elgie Collard, PA-C   4 mg at 02/21/20 [id number]  . oxyCODONE (Oxy IR/ROXICODONE) immediate release tablet 5-10 mg  5-10 mg Oral Q3H PRN Elgie Collard, PA-C   10 mg at 02/22/20 0653  . pantoprazole (PROTONIX) EC tablet 40 mg  40 mg Oral Daily Nicholes Rough N, PA-C   40 mg at 02/21/20 1008  . potassium chloride SA (KLOR-CON) CR tablet 20 mEq  20 mEq Oral Q4H Rexene Alberts, MD   20 mEq at 02/22/20 0649  . sodium chloride flush (NS) 0.9 % injection 10-40 mL  10-40 mL Intracatheter Q12H Rexene Alberts, MD   20 mL at 02/21/20 2151  . sodium chloride flush (NS) 0.9 % injection 10-40 mL  10-40 mL Intracatheter PRN Rexene Alberts, MD      . sodium chloride flush (NS) 0.9  % injection 3 mL  3 mL Intravenous Q12H Conte, Tessa N, PA-C   3 mL at 02/21/20 2151  . sodium chloride flush (NS) 0.9 % injection 3 mL  3 mL Intravenous PRN Harriet Pho, Tessa N, PA-C      . traMADol Veatrice Bourbon) tablet 50-100 mg  50-100 mg Oral Q4H PRN Harriet Pho, Tessa N, PA-C   100 mg at 02/21/20 0518  . warfarin (COUMADIN) tablet 2.5 mg  2.5 mg Oral q1800 Rexene Alberts, MD   2.5 mg at 02/21/20 1839  . Warfarin - Physician Dosing Inpatient   Does not apply q1800 Rexene Alberts, MD   Given at 02/20/20 [id number]   No Known Allergies Principal Problem:   S/P mitral valve repair + CABG + maze procedure Active Problems:   Stable angina ([facility])   Persistent atrial fibrillation ([facility])   Nonrheumatic mitral valve regurgitation   Coronary artery disease involving native coronary artery of native heart with angina pectoris ([facility])   Mitral valve prolapse   GERD (gastroesophageal reflux disease)   S/P CABG x 1   S/P Maze operation for atrial fibrillation  Blood pressure (!) 108/95, pulse 79, temperature 98.1 F (36.7 C), temperature source Oral, resp. rate 18, height 5\' 10"  (1.778 m), weight 90.4 kg, SpO2 99 %.   Physical Exam  Well-appearing, NAD CTA RRR C/d/i  A/P: doing well txfer to floor Add metoprolol Continue diuresis  Wonda Olds 02/22/2020

## 2020-02-22 NOTE — Progress Notes (Signed)
Pt ambulated 1000 feet in hall. O2 sat above 93% for duration. Pt tolerated very well.  Will continue to monitor.  Jerald Kief, RN

## 2020-02-23 ENCOUNTER — Inpatient Hospital Stay (HOSPITAL_COMMUNITY): Payer: BC Managed Care – PPO

## 2020-02-23 ENCOUNTER — Encounter: Payer: Self-pay | Admitting: *Deleted

## 2020-02-23 DIAGNOSIS — J9811 Atelectasis: Secondary | ICD-10-CM | POA: Diagnosis not present

## 2020-02-23 LAB — PROTIME-INR
INR: 1.1 (ref 0.8–1.2)
Prothrombin Time: 14.4 seconds (ref 11.4–15.2)

## 2020-02-23 LAB — URINALYSIS, ROUTINE W REFLEX MICROSCOPIC
Bilirubin Urine: NEGATIVE
Glucose, UA: NEGATIVE mg/dL
Hgb urine dipstick: NEGATIVE
Ketones, ur: NEGATIVE mg/dL
Leukocytes,Ua: NEGATIVE
Nitrite: NEGATIVE
Protein, ur: NEGATIVE mg/dL
Specific Gravity, Urine: 1.005 (ref 1.005–1.030)
pH: 8 (ref 5.0–8.0)

## 2020-02-23 LAB — BASIC METABOLIC PANEL
Anion gap: 10 (ref 5–15)
BUN: 17 mg/dL (ref 8–23)
CO2: 29 mmol/L (ref 22–32)
Calcium: 8.3 mg/dL — ABNORMAL LOW (ref 8.9–10.3)
Chloride: 99 mmol/L (ref 98–111)
Creatinine, Ser: 0.92 mg/dL (ref 0.61–1.24)
GFR calc Af Amer: >60 mL/min (ref >60)
GFR calc non Af Amer: >60 mL/min (ref >60)
Glucose, Bld: 117 mg/dL — ABNORMAL HIGH (ref 70–99)
Potassium: 4.2 mmol/L (ref 3.5–5.1)
Sodium: 138 mmol/L (ref 135–145)

## 2020-02-23 LAB — CBC
HCT: 31.6 % — ABNORMAL LOW (ref 39.0–52.0)
Hemoglobin: 10.5 g/dL — ABNORMAL LOW (ref 13.0–17.0)
MCH: 30.2 pg (ref 26.0–34.0)
MCHC: 33.2 g/dL (ref 30.0–36.0)
MCV: 90.8 fL (ref 80.0–100.0)
Platelets: 114 10*3/uL — ABNORMAL LOW (ref 150–400)
RBC: 3.48 MIL/uL — ABNORMAL LOW (ref 4.22–5.81)
RDW: 13.5 % (ref 11.5–15.5)
WBC: 9.9 10*3/uL (ref 4.0–10.5)
nRBC: 0 % (ref 0.0–0.2)

## 2020-02-23 LAB — GLUCOSE, CAPILLARY
Glucose-Capillary: 117 mg/dL — ABNORMAL HIGH (ref 70–99)
Glucose-Capillary: 106 mg/dL — ABNORMAL HIGH (ref 70–99)
Glucose-Capillary: 139 mg/dL — ABNORMAL HIGH (ref 70–99)
Glucose-Capillary: 97 mg/dL (ref 70–99)
Glucose-Capillary: 116 mg/dL — ABNORMAL HIGH (ref 70–99)

## 2020-02-23 MED ORDER — WARFARIN SODIUM 5 MG PO TABS
5.0000 mg | ORAL_TABLET | Freq: Every day | ORAL | Status: DC
  Administered 2020-02-23: 5 mg via ORAL
  Filled 2020-02-23: qty 1

## 2020-02-23 MED ORDER — PROMETHAZINE HCL 25 MG/ML IJ SOLN
12.5000 mg | Freq: Four times a day (QID) | INTRAMUSCULAR | Status: DC | PRN
  Administered 2020-02-23: 12.5 mg via INTRAVENOUS
  Filled 2020-02-23: qty 1

## 2020-02-23 MED ORDER — POLYETHYLENE GLYCOL 3350 17 G PO PACK
17.0000 g | PACK | Freq: Every day | ORAL | Status: DC
  Administered 2020-02-23: 17 g via ORAL
  Filled 2020-02-23: qty 1

## 2020-02-23 MED ORDER — METOCLOPRAMIDE HCL 5 MG/ML IJ SOLN
10.0000 mg | Freq: Four times a day (QID) | INTRAMUSCULAR | Status: AC
  Administered 2020-02-23 (×3): 10 mg via INTRAVENOUS
  Filled 2020-02-23 (×3): qty 2

## 2020-02-23 MED FILL — Heparin Sodium (Porcine) Inj 1000 Unit/ML: INTRAMUSCULAR | Qty: 30 | Status: AC

## 2020-02-23 MED FILL — Electrolyte-R (PH 7.4) Solution: INTRAVENOUS | Qty: 5000 | Status: AC

## 2020-02-23 MED FILL — Sodium Bicarbonate IV Soln 8.4%: INTRAVENOUS | Qty: 50 | Status: AC

## 2020-02-23 MED FILL — Sodium Chloride IV Soln 0.9%: INTRAVENOUS | Qty: 2000 | Status: AC

## 2020-02-23 MED FILL — Mannitol IV Soln 20%: INTRAVENOUS | Qty: 500 | Status: AC

## 2020-02-23 NOTE — Progress Notes (Signed)
      [address]       [place] [address]             [phone]       Low-grade fevers this afternoon (100.8)  treated with Tylenol. Labs all okay from this morning with WBC 9.9. Urine checked and was clear, no UTI. Wires removed without issue this morning. COVID 19 test from 3/8 negative. Patient was able to eat a bear claw from [employer] without nausea. Walking now and working on bowel movement. Miralax ordered this morning but patient has not received yet.   Plan: Wife ordered a milk shake for this afternoon for him to try. Labs ordered for tomorrow. Encouraged incentive spirometer hourly. Working on bowel movement with miralax and stool softeners. Only Tylenol for pain. Will check on patient later this afternoon.

## 2020-02-23 NOTE — Progress Notes (Signed)
CARDIAC REHAB PHASE I   Offered to walk with pt. Pt c/o severe nausea, RN currently giving meds. Will allow time to rest and try again later. Of note, pt ambulated ~1034ft this am without difficulty.  Rufina Falco, RN BSN 02/23/2020 8:50 AM

## 2020-02-23 NOTE — Progress Notes (Signed)
Pt ambulated around the unit x 970 feet around the unit pt tolerated well but still having some nausea

## 2020-02-23 NOTE — Addendum Note (Signed)
Addendum  created 02/23/20 0910 by Josephine Igo, CRNA   Order list changed

## 2020-02-23 NOTE — Progress Notes (Signed)
Pt EPW removed and intact. Pt tolerated well and agrees to 1 hr bedrest. Vitals monitor q 31min for 1 hr. Will continue to monitor.  Jerald Kief, RN

## 2020-02-23 NOTE — Progress Notes (Addendum)
[address]       [place] [address]             [phone]      4 Days Post-Op Procedure(s) (LRB): MITRAL VALVE REPAIR (MVR) USING MEMO 4D RING SIZE 34MM (N/A) CORONARY ARTERY BYPASS GRAFTING (CABG) x ONE, USING LEFT INTERNAL MAMMARY ARTERY (N/A) MAZE (N/A) TRANSESOPHAGEAL ECHOCARDIOGRAM (TEE) (N/A) Clipping Of Atrial Appendage Using AtriCure PROClip size 45MM (N/A) Subjective: Very nauseated this morning. Has not had a bowel movement and zofran not helping.   Objective: Vital signs in last 24 hours: Temp:  [97.7 F (36.5 C)-99.1 F (37.3 C)] 98.6 F (37 C) (03/15 0414) Pulse Rate:  [71-93] 77 (03/15 0414) Cardiac Rhythm: Normal sinus rhythm;Heart block (03/15 0414) Resp:  [15-22] 20 (03/15 0414) BP: (104-133)/(63-87) 114/67 (03/15 0414) SpO2:  [90 %-100 %] 98 % (03/15 0414)     Intake/Output from previous day: 03/14 0701 - 03/15 0700 In: 360 [P.O.:360] Out: 600 [Urine:600] Intake/Output this shift: No intake/output data recorded.  General appearance: alert, cooperative and no distress Heart: regular rate and rhythm, S1, S2 normal, no murmur, click, rub or gallop Lungs: clear to auscultation bilaterally Abdomen: hyperactive bowel sounds Extremities: extremities normal, atraumatic, no cyanosis or edema Wound: clean and dry   Lab Results: Recent Labs    02/22/20 0421 02/23/20 0249  WBC 10.1 9.9  HGB 10.4* 10.5*  HCT 31.1* 31.6*  PLT 79* 114*   BMET:  Recent Labs    02/22/20 0421 02/23/20 0249  NA 135 138  K 3.6 4.2  CL 99 99  CO2 27 29  GLUCOSE 121* 117*  BUN 18 17  CREATININE 0.75 0.92  CALCIUM 7.9* 8.3*    PT/INR:  Recent Labs    02/23/20 0249  LABPROT 14.4  INR 1.1   ABG    Component Value Date/Time   PHART 7.341 (L) 02/19/2020 2024   HCO3 19.6 (L) 02/19/2020 2024   TCO2 21 (L) 02/19/2020 2024   ACIDBASEDEF 6.0 (H) 02/19/2020 2024   O2SAT 97.0 02/19/2020 2024   CBG (last 3)  Recent Labs     02/21/20 2025 02/22/20 0019 02/22/20 0813  GLUCAP 109* 84 142*    Assessment/Plan: S/P Procedure(s) (LRB): MITRAL VALVE REPAIR (MVR) USING MEMO 4D RING SIZE 34MM (N/A) CORONARY ARTERY BYPASS GRAFTING (CABG) x ONE, USING LEFT INTERNAL MAMMARY ARTERY (N/A) MAZE (N/A) TRANSESOPHAGEAL ECHOCARDIOGRAM (TEE) (N/A) Clipping Of Atrial Appendage Using AtriCure PROClip size 45MM (N/A)  1. CV-1st degree AVB, rate in the 70s, BP well controlled. Continue asa and low-dose BB. Statin therapy? 2. Pulm-tolerating room air with excellent saturation. Minimal bibasilar atelectasis on CXR. 3. Renal-creatinine 0.92, electrolytes okay. On lasix 40mg  BID. Remains 4kg over baseline weight.  4. H and H 10.5/31.6, expected acute blood loss anemia 5. Endo-blood glucose well controlled. Not a diabetic. Discontinued CBGs 6. Continue coumadin 2.5mg  daily, INR 1.1 7. Nausea-zofran not helping. No bowel movement since surgery. Not taking any narcotics just tylenol. Only tolerating liquids. Added phenergan 12.5mg  IV and Miralax daily.   Plan: working on nausea this morning. Encouraged to try a BRAT diet later today if he feels better. Tolerating only liquids at the moment. Ambulate. Encouraged use of incentive spirometer.     LOS: 4 days    Elgie Collard 02/23/2020  I have seen and examined the patient and agree with the assessment and plan as outlined.  Feels nauseated.  No abdominal pain.  Otherwise looks good.  Maintaining NSR w/ stable BP.  CXR clear and gastric dilatation has resolved.  Rexene Alberts, MD 02/23/2020

## 2020-02-23 NOTE — Progress Notes (Signed)
CARDIAC REHAB PHASE I   Pt seen ambulating in hallway independently for four laps. Pt denies pain or SOB, states some nausea but it is improving. D/c education completed with pt. Pt educated on importance of showers and monitoring incision daily. Encourage continued IS use, walks, and sternal precautions. Pt given in-the-tube sheet along with heart healthy diet. Reviewed restrictions and exercise guidelines. Will refer to CRP [facility] [place]. Pt is interested in participating in Virtual Cardiac and Pulmonary Rehab. Pt advised that Virtual Cardiac and Pulmonary Rehab is provided at no cost to the patient.  Checklist:  1. Pt has smart device  ie smartphone and/or ipad for downloading an app  Yes 2. Reliable internet/wifi service    Yes 3. Understands how to use their smartphone and navigate within an app.  Yes  Pt verbalized understanding and is in agreement. Pt hopeful for d/c tomorrow. Pt demonstrating [id number] on IS.  [id number] Rufina Falco, RN BSN 02/23/2020 2:06 PM

## 2020-02-23 NOTE — Discharge Instructions (Signed)
Discharge Instructions:  1. You may shower, please wash incisions daily with soap and water and keep dry.  If you wish to cover wounds with dressing you may do so but please keep clean and change daily.  No tub baths or swimming until incisions have completely healed.  If your incisions become red or develop any drainage please call our office at [phone]  2. No Driving until cleared by our office and you are no longer using narcotic pain medications  3. Monitor your weight daily.. Please use the same scale and weigh at same time... If you gain 3-5 lbs in 48 hours with associated lower extremity swelling, please contact our office at [phone]  4. Fever of 101.5 for at least 24 hours with no source, please contact our office at [phone]  5. Activity- up as tolerated, please walk at least 3 times per day.  Avoid strenuous activity, no lifting, pushing, or pulling with your arms over 8-10 lbs for a minimum of 6 weeks  6. If any questions or concerns arise, please do not hesitate to contact our office at [phone]  -------------------------- Information on my medicine - Coumadin   (Warfarin)  Why was Coumadin prescribed for you? Coumadin was prescribed for you because you have a blood clot or a medical condition that can cause an increased risk of forming blood clots. Blood clots can cause serious health problems by blocking the flow of blood to the heart, lung, or brain. Coumadin can prevent harmful blood clots from forming. As a reminder your indication for Coumadin is:   Blood Clot Prevention After Heart Valve Surgery  What test will check on my response to Coumadin? While on Coumadin (warfarin) you will need to have an INR test regularly to ensure that your dose is keeping you in the desired range. The INR (international normalized ratio) number is calculated from the result of the laboratory test called prothrombin time (PT).  If an INR APPOINTMENT HAS NOT ALREADY BEEN MADE  FOR YOU please schedule an appointment to have this lab work done by your health care provider within 7 days. Your INR goal is usually a number between:  2 to 3 or your provider may give you a more narrow range like 2-2.5.  Ask your health care provider during an office visit what your goal INR is.  What  do you need to  know  About  COUMADIN? Take Coumadin (warfarin) exactly as prescribed by your healthcare provider about the same time each day.  DO NOT stop taking without talking to the doctor who prescribed the medication.  Stopping without other blood clot prevention medication to take the place of Coumadin may increase your risk of developing a new clot or stroke.  Get refills before you run out.  What do you do if you miss a dose? If you miss a dose, take it as soon as you remember on the same day then continue your regularly scheduled regimen the next day.  Do not take two doses of Coumadin at the same time.  Important Safety Information A possible side effect of Coumadin (Warfarin) is an increased risk of bleeding. You should call your healthcare provider right away if you experience any of the following: ? Bleeding from an injury or your nose that does not stop. ? Unusual colored urine (red or dark brown) or unusual colored stools (red or black). ? Unusual bruising for unknown reasons. ? A serious fall or if you hit your head (even if  there is no bleeding).  Some foods or medicines interact with Coumadin (warfarin) and might alter your response to warfarin. To help avoid this: ? Eat a balanced diet, maintaining a consistent amount of Vitamin K. ? Notify your provider about major diet changes you plan to make. ? Avoid alcohol or limit your intake to 1 drink for women and 2 drinks for men per day. (1 drink is 5 oz. wine, 12 oz. beer, or 1.5 oz. liquor.)  Make sure that ANY health care provider who prescribes medication for you knows that you are taking Coumadin (warfarin).  Also make  sure the healthcare provider who is monitoring your Coumadin knows when you have started a new medication including herbals and non-prescription products.  Coumadin (Warfarin)  Major Drug Interactions  Increased Warfarin Effect Decreased Warfarin Effect  Alcohol (large quantities) Antibiotics (esp. Septra/Bactrim, Flagyl, Cipro) Amiodarone (Cordarone) Aspirin (ASA) Cimetidine (Tagamet) Megestrol (Megace) NSAIDs (ibuprofen, naproxen, etc.) Piroxicam (Feldene) Propafenone (Rythmol SR) Propranolol (Inderal) Isoniazid (INH) Posaconazole (Noxafil) Barbiturates (Phenobarbital) Carbamazepine (Tegretol) Chlordiazepoxide (Librium) Cholestyramine (Questran) Griseofulvin Oral Contraceptives Rifampin Sucralfate (Carafate) Vitamin K   Coumadin (Warfarin) Major Herbal Interactions  Increased Warfarin Effect Decreased Warfarin Effect  Garlic Ginseng Ginkgo biloba Coenzyme Q10 Green tea St. John's wort    Coumadin (Warfarin) FOOD Interactions  Eat a consistent number of servings per week of foods HIGH in Vitamin K (1 serving =  cup)  Collards (cooked, or boiled & drained) Kale (cooked, or boiled & drained) Mustard greens (cooked, or boiled & drained) Parsley *serving size only =  cup Spinach (cooked, or boiled & drained) Swiss chard (cooked, or boiled & drained) Turnip greens (cooked, or boiled & drained)  Eat a consistent number of servings per week of foods MEDIUM-HIGH in Vitamin K (1 serving = 1 cup)  Asparagus (cooked, or boiled & drained) Broccoli (cooked, boiled & drained, or raw & chopped) Brussel sprouts (cooked, or boiled & drained) *serving size only =  cup Lettuce, raw (green leaf, endive, romaine) Spinach, raw Turnip greens, raw & chopped   These websites have more information on Coumadin (warfarin):  FailFactory.se; VeganReport.com.au;

## 2020-02-24 ENCOUNTER — Encounter: Payer: Self-pay | Admitting: *Deleted

## 2020-02-24 ENCOUNTER — Telehealth: Payer: Self-pay | Admitting: Thoracic Surgery (Cardiothoracic Vascular Surgery)

## 2020-02-24 DIAGNOSIS — Z006 Encounter for examination for normal comparison and control in clinical research program: Secondary | ICD-10-CM

## 2020-02-24 LAB — CBC
HCT: 29.1 % — ABNORMAL LOW (ref 39.0–52.0)
Hemoglobin: 9.9 g/dL — ABNORMAL LOW (ref 13.0–17.0)
MCH: 30.3 pg (ref 26.0–34.0)
MCHC: 34 g/dL (ref 30.0–36.0)
MCV: 89 fL (ref 80.0–100.0)
Platelets: 125 10*3/uL — ABNORMAL LOW (ref 150–400)
RBC: 3.27 MIL/uL — ABNORMAL LOW (ref 4.22–5.81)
RDW: 13.2 % (ref 11.5–15.5)
WBC: 8.9 10*3/uL (ref 4.0–10.5)
nRBC: 0 % (ref 0.0–0.2)

## 2020-02-24 LAB — COMPREHENSIVE METABOLIC PANEL
ALT: 33 U/L (ref 0–44)
AST: 40 U/L (ref 15–41)
Albumin: 2.6 g/dL — ABNORMAL LOW (ref 3.5–5.0)
Alkaline Phosphatase: 94 U/L (ref 38–126)
Anion gap: 12 (ref 5–15)
BUN: 14 mg/dL (ref 8–23)
CO2: 27 mmol/L (ref 22–32)
Calcium: 8.1 mg/dL — ABNORMAL LOW (ref 8.9–10.3)
Chloride: 97 mmol/L — ABNORMAL LOW (ref 98–111)
Creatinine, Ser: 0.84 mg/dL (ref 0.61–1.24)
GFR calc Af Amer: >60 mL/min (ref >60)
GFR calc non Af Amer: >60 mL/min (ref >60)
Glucose, Bld: 113 mg/dL — ABNORMAL HIGH (ref 70–99)
Potassium: 3.5 mmol/L (ref 3.5–5.1)
Sodium: 136 mmol/L (ref 135–145)
Total Bilirubin: 0.8 mg/dL (ref 0.3–1.2)
Total Protein: 5.4 g/dL — ABNORMAL LOW (ref 6.5–8.1)

## 2020-02-24 LAB — PROTIME-INR
INR: 1.1 (ref 0.8–1.2)
Prothrombin Time: 14 seconds (ref 11.4–15.2)

## 2020-02-24 LAB — URINE CULTURE: Culture: NO GROWTH

## 2020-02-24 LAB — AMYLASE: Amylase: 86 U/L (ref 28–100)

## 2020-02-24 LAB — AEROBIC/ANAEROBIC CULTURE W GRAM STAIN (SURGICAL/DEEP WOUND)
Culture: NO GROWTH
Gram Stain: NONE SEEN

## 2020-02-24 MED ORDER — FUROSEMIDE 40 MG PO TABS: 40.0000 mg | ORAL_TABLET | Freq: Every day | ORAL | 0 refills | Status: DC

## 2020-02-24 MED ORDER — METOPROLOL TARTRATE 25 MG PO TABS: 12.5000 mg | ORAL_TABLET | Freq: Two times a day (BID) | ORAL | 1 refills | Status: DC

## 2020-02-24 MED ORDER — WARFARIN SODIUM 5 MG PO TABS: 5.0000 mg | ORAL_TABLET | Freq: Every day | ORAL | 1 refills | Status: DC

## 2020-02-24 MED ORDER — POTASSIUM CHLORIDE ER 20 MEQ PO TBCR: 20.0000 meq | EXTENDED_RELEASE_TABLET | Freq: Every day | ORAL | 0 refills | Status: DC

## 2020-02-24 MED ORDER — ASPIRIN 81 MG PO TBEC: 81.0000 mg | DELAYED_RELEASE_TABLET | Freq: Every day | ORAL | Status: AC

## 2020-02-24 MED ORDER — ACETAMINOPHEN 500 MG PO TABS: 1000.0000 mg | ORAL_TABLET | Freq: Four times a day (QID) | ORAL | 0 refills | Status: DC

## 2020-02-24 MED FILL — WARFARIN SODIUM 5 MG TABLET: 5 | 30 days supply | Qty: 30 | Fill #0

## 2020-02-24 MED FILL — METOPROLOL TARTRATE 25 MG T: 25 | 30 days supply | Qty: 30 | Fill #0

## 2020-02-24 MED FILL — FUROSEMIDE 40 MG TABLET: 40 | 3 days supply | Qty: 3 | Fill #0

## 2020-02-24 MED FILL — POTASSIUM CHLORIDE 20meqER: 20 | 3 days supply | Qty: 3 | Fill #0

## 2020-02-24 MED FILL — ACETAMINOPHEN 500MG XT STRE: 500 | 4 days supply | Qty: 30 | Fill #0

## 2020-02-24 NOTE — Progress Notes (Signed)
Order received to discharge patient.  Telemetry monitor removed and CCMD notified.  PIV access removed.  Discharge instructions, follow up, medications and instructions for their use discussed with patient. 

## 2020-02-24 NOTE — Progress Notes (Signed)
CARDIAC REHAB PHASE I   Pt denies questions or concerns regarding discharge education. Anxious to go home, feeling better today. Pt referred to CRP [facility] [place].  [id number] Rufina Falco, RN BSN 02/24/2020 9:32 AM

## 2020-02-24 NOTE — Research (Signed)
Daniel Browning meets criteria for the TRAC-AF registry. The registry was discussed with him and his spouse. He had the opportunity to read the consent and ask questions. The consent was signed and a copy was emailed to the patient.

## 2020-02-24 NOTE — Progress Notes (Addendum)
      [address]       [place] [address]             [phone]      5 Days Post-Op Procedure(s) (LRB): MITRAL VALVE REPAIR (MVR) USING MEMO 4D RING SIZE 34MM (N/A) CORONARY ARTERY BYPASS GRAFTING (CABG) x ONE, USING LEFT INTERNAL MAMMARY ARTERY (N/A) MAZE (N/A) TRANSESOPHAGEAL ECHOCARDIOGRAM (TEE) (N/A) Clipping Of Atrial Appendage Using AtriCure PROClip size 45MM (N/A) Subjective: Feels good this morning. Still unable to sleep but nausea resolved with bowel movement  Objective: Vital signs in last 24 hours: Temp:  [98.9 F (37.2 C)-100.5 F (38.1 C)] 99.5 F (37.5 C) (03/15 2318) Pulse Rate:  [80-82] 80 (03/15 2318) Cardiac Rhythm: Normal sinus rhythm;Heart block (03/15 0840) Resp:  [15-28] 20 (03/15 2318) BP: (102-128)/(64-79) 128/79 (03/15 2318) SpO2:  [92 %-97 %] 96 % (03/15 2318) Weight:  [86.4 kg] 86.4 kg (03/16 0500)     Intake/Output from previous day: No intake/output data recorded. Intake/Output this shift: No intake/output data recorded.  General appearance: alert, cooperative and no distress Heart: regular rate and rhythm, S1, S2 normal, no murmur, click, rub or gallop Lungs: clear to auscultation bilaterally Abdomen: soft, non-tender; bowel sounds normal; no masses,  no organomegaly Extremities: extremities normal, atraumatic, no cyanosis or edema Wound: clean and dry  Lab Results: Recent Labs    02/23/20 0249 02/24/20 0152  WBC 9.9 8.9  HGB 10.5* 9.9*  HCT 31.6* 29.1*  PLT 114* 125*   BMET:  Recent Labs    02/23/20 0249 02/24/20 0152  NA 138 136  K 4.2 3.5  CL 99 97*  CO2 29 27  GLUCOSE 117* 113*  BUN 17 14  CREATININE 0.92 0.84  CALCIUM 8.3* 8.1*    PT/INR:  Recent Labs    02/24/20 0152  LABPROT 14.0  INR 1.1   ABG    Component Value Date/Time   PHART 7.341 (L) 02/19/2020 2024   HCO3 19.6 (L) 02/19/2020 2024   TCO2 21 (L) 02/19/2020 2024   ACIDBASEDEF 6.0 (H) 02/19/2020 2024   O2SAT 97.0 02/19/2020  2024   CBG (last 3)  Recent Labs    02/23/20 1258 02/23/20 1642 02/23/20 2048  GLUCAP 139* 97 116*    Assessment/Plan: S/P Procedure(s) (LRB): MITRAL VALVE REPAIR (MVR) USING MEMO 4D RING SIZE 34MM (N/A) CORONARY ARTERY BYPASS GRAFTING (CABG) x ONE, USING LEFT INTERNAL MAMMARY ARTERY (N/A) MAZE (N/A) TRANSESOPHAGEAL ECHOCARDIOGRAM (TEE) (N/A) Clipping Of Atrial Appendage Using AtriCure PROClip size 45MM (N/A)  1. CV-NSR in the 70s, Continue ASA and low-dose BB. No statin.  2. Pulm-tolerating room air with excellent saturation. Mild atelectasis on CXR 3. Renal-creatinine 0.84, electrolytes okay.  4. H and H 9.9/29.1, expected acute blood loss anemia. Slightly lower than yesterday 5. Endo-not a diabetic. Well controlled 6. INR 1.1, continue coumadin 5mg . 7. Nausea-completely resolved after bowel movements yesterday  Plan: Patient wants to go home today. Will discharge if okay with Dr. Roxy Manns.    LOS: 5 days    Daniel Browning 02/24/2020   I have seen and examined the patient and agree with the assessment and plan as outlined.  Doing well.  D/C home today. Instructions given.  Rexene Alberts, MD 02/24/2020 8:37 AM

## 2020-02-24 NOTE — Telephone Encounter (Signed)
Mr. Belt was discharged this morning.  Mrs. Kuang called to report a low-grade fever of 100.2 this afternoon.  No cough, burning with urination, redness or drainage associated with the incisions.  His white blood cell count was 8.9 this morning prior to discharge.  Probable atelectasis.  Recommended incentive spirometer.  If fever greater than 101 needs to go to the emergency room for further evaluation.  Revonda Standard Roxan Hockey, MD [facility] [phone]

## 2020-02-25 ENCOUNTER — Other Ambulatory Visit: Payer: Self-pay

## 2020-02-25 ENCOUNTER — Ambulatory Visit: Payer: BC Managed Care – PPO | Admitting: Cardiology

## 2020-02-25 DIAGNOSIS — Z5181 Encounter for therapeutic drug level monitoring: Secondary | ICD-10-CM | POA: Diagnosis not present

## 2020-02-25 DIAGNOSIS — I4819 Other persistent atrial fibrillation: Secondary | ICD-10-CM | POA: Diagnosis not present

## 2020-02-25 DIAGNOSIS — Z9889 Other specified postprocedural states: Secondary | ICD-10-CM

## 2020-02-25 DIAGNOSIS — I4891 Unspecified atrial fibrillation: Secondary | ICD-10-CM | POA: Insufficient documentation

## 2020-02-25 DIAGNOSIS — Z7901 Long term (current) use of anticoagulants: Secondary | ICD-10-CM | POA: Diagnosis not present

## 2020-02-25 LAB — POCT INR: INR: 1.4 — AB (ref 2.0–3.0)

## 2020-02-25 NOTE — Progress Notes (Signed)
Goal: 2.0-3.0 Indication: Afib, mitral valve repair Today's INR: 1.4 Current dose: 5 mg daily (started on 3/14) New dose: 5 mg (No change) Next INR check: 3/25  Vernell Leep, MD

## 2020-02-26 ENCOUNTER — Telehealth (HOSPITAL_COMMUNITY): Payer: Self-pay

## 2020-02-26 NOTE — Telephone Encounter (Signed)
Pt insurance is active and benefits verified through [employer] Co-pay 0, DED $5,600/$5,600 met, out of pocket $7,000/$5,600 met, co-insurance 20%. no pre-authorization required. Passport, 02/26/2020@9 [address] [place], REF# [id number]  Will contact patient to see if he is interested in the Cardiac Rehab Program. If interested, patient will need to complete follow up appt. Once completed, patient will be contacted for scheduling upon review by the RN Navigator.

## 2020-02-27 ENCOUNTER — Other Ambulatory Visit: Payer: Self-pay | Admitting: Physician Assistant

## 2020-02-27 MED ORDER — TRAMADOL HCL 50 MG PO TABS: 50.0000 mg | ORAL_TABLET | Freq: Every evening | ORAL | 0 refills | Status: DC | PRN

## 2020-02-27 NOTE — Progress Notes (Signed)
      [address]       [place] [address]             [phone]        Spoke with [name] this morning. It seems like Mr. Daniel Browning is having trouble getting comfortable at night since he is a stomach sleeper naturally. He is having some lower back cramping as well which isn't helping. His incision is mildly sore, yet improving. His wife endorses him walking 3 x daily even in the rain. He is determined to feel better. He called asking for some stronger pain medication to help him get through the night. I called in some tramadol for the patient to take at night and also suggested tylenol PM. I told his wife to call back if there were any further issues. Tramadol e-prescribed to [facility].  Nicholes Rough, PA-C

## 2020-03-04 ENCOUNTER — Ambulatory Visit: Payer: BC Managed Care – PPO | Admitting: Cardiology

## 2020-03-04 ENCOUNTER — Other Ambulatory Visit: Payer: Self-pay

## 2020-03-04 DIAGNOSIS — Z7901 Long term (current) use of anticoagulants: Secondary | ICD-10-CM | POA: Diagnosis not present

## 2020-03-04 DIAGNOSIS — Z5181 Encounter for therapeutic drug level monitoring: Secondary | ICD-10-CM | POA: Diagnosis not present

## 2020-03-04 DIAGNOSIS — I4891 Unspecified atrial fibrillation: Secondary | ICD-10-CM

## 2020-03-04 LAB — POCT INR: INR: 2 (ref 2.0–3.0)

## 2020-03-04 NOTE — Progress Notes (Signed)
Anticoagulation Summary  As of 03/04/2020   INR goal:  2.0-3.0  TTR:  --  INR used for dosing:  2.0 (03/04/2020)  Warfarin maintenance plan:  5 mg (5 mg x 1) every day  Weekly warfarin total:  35 mg  Weekly max warfarin dose:  5 mg  Plan last modified:  Wysor, Tammy T, CMA (02/25/2020)  Next INR check:  03/11/2020  Target end date:     Indications   Atrial fibrillation ([place]) [I48.91]        Anticoagulation Episode Summary    INR check location:     Preferred lab:     Send INR reminders to:     Comments:       ASSESSMENT:   New start warfarin pt. Pt started on 02/19/20 on 5 mg daily dose. Pt with mitral valve repair, CABG, and Maze procedure completed on 02/19/20. Pt previously on Xeralto but was held 2 days prior to surgery. Pt w/ hx of mitral valve prolapse with life-long murmur and CAD. Recently diagnosed with mitral regurgitation, stable angina, and recent onset persistent atrial fibrillation. Reports tolerating warfarin well so far. No reported s/sx of bleeding or bruising. No s/sx of stroke. Denies missed doses or extra doses. F/u appt with Dr. Virgina Jock on 03/11/20 to determine duration of anticoag therapy.   PLAN  Goal: 2.0-3.0 Indication: Afib, mitral valve repair Today's INR: 2.0 Current dose: 5 mg daily (started on 3/14) New dose: 5 mg (No change) Next INR check: 03/11/20 (with OV w/ Dr. Virgina Jock)   Alysia Penna, PharmD

## 2020-03-11 ENCOUNTER — Encounter: Payer: Self-pay | Admitting: Cardiology

## 2020-03-11 ENCOUNTER — Ambulatory Visit: Payer: BC Managed Care – PPO | Admitting: Cardiology

## 2020-03-11 ENCOUNTER — Other Ambulatory Visit: Payer: Self-pay

## 2020-03-11 VITALS — BP 117/63 | HR 49 | Temp 97.7°F | Ht 70.0 in | Wt 188.0 lb

## 2020-03-11 DIAGNOSIS — I340 Nonrheumatic mitral (valve) insufficiency: Secondary | ICD-10-CM | POA: Diagnosis not present

## 2020-03-11 DIAGNOSIS — I480 Paroxysmal atrial fibrillation: Secondary | ICD-10-CM | POA: Diagnosis not present

## 2020-03-11 DIAGNOSIS — Z5181 Encounter for therapeutic drug level monitoring: Secondary | ICD-10-CM | POA: Diagnosis not present

## 2020-03-11 DIAGNOSIS — Z9889 Other specified postprocedural states: Secondary | ICD-10-CM

## 2020-03-11 DIAGNOSIS — I25810 Atherosclerosis of coronary artery bypass graft(s) without angina pectoris: Secondary | ICD-10-CM | POA: Insufficient documentation

## 2020-03-11 DIAGNOSIS — Z7901 Long term (current) use of anticoagulants: Secondary | ICD-10-CM | POA: Diagnosis not present

## 2020-03-11 LAB — POCT INR: INR: 1.7 — AB (ref 2.0–3.0)

## 2020-03-11 MED ORDER — WARFARIN SODIUM 5 MG PO TABS: 5.0000 mg | ORAL_TABLET | Freq: Every day | ORAL | 3 refills | Status: DC

## 2020-03-11 NOTE — Progress Notes (Signed)
Patient referred by [name], MD for exertional chest pain  Subjective:   Daniel Browning, male    DOB: December 04, 1956, 64 y.o.   MRN: [id number]   Chief Complaint  Patient presents with  . Atrial Fibrillation  . Follow-up    HPI  64 y.o.Caucasianmalewith paroxysmal Afib paroxysmal Afib, severe MR, CAD (LAD CTO), now s/p complex mitral valvuloplasty, CABGX1 (LIMA-LAD), Maze procedure.  Patient is here with his daughter. She has had complete resolution of his chest pain. He still has exertional dyspnea, especially while walking uphill. He denies orthopnea, PND, leg edema symptoms.    Current Outpatient Medications on File Prior to Visit  Medication Sig Dispense Refill  . acetaminophen (TYLENOL) 500 MG tablet Take 2 tablets (1,000 mg total) by mouth every 6 (six) hours. 30 tablet 0  . aspirin EC 81 MG EC tablet Take 1 tablet (81 mg total) by mouth daily.    . metoprolol tartrate (LOPRESSOR) 25 MG tablet Take 0.5 tablets (12.5 mg total) by mouth 2 (two) times daily. 30 tablet 1  . omeprazole (PRILOSEC OTC) 20 MG tablet Take 20 mg by mouth daily.    . traMADol (ULTRAM) 50 MG tablet Take 1 tablet (50 mg total) by mouth at bedtime as needed. 20 tablet 0  . warfarin (COUMADIN) 5 MG tablet Take 1 tablet (5 mg total) by mouth daily at 6 PM. 30 tablet 1  . furosemide (LASIX) 40 MG tablet Take 1 tablet (40 mg total) by mouth daily for 3 days. 3 tablet 0  . potassium chloride 20 MEQ TBCR Take 20 mEq by mouth daily for 3 days. 3 tablet 0   No current facility-administered medications on file prior to visit.    Cardiovascular and other pertinent studies:  EKG 03/11/2020: Probable sinus tachycardia 102 bpm. First degree A-V block. Frequent ectopic ventricular beats Diffuse nonspecific T-abnormality.   02/19/2020 (Dr. Roxy Manns):  Mitral Valve Repair             Complex valvuloplasty including artificial Gore-tex neochord placement x8             Decalcification of posterior leaflet and posterior  annulus             Sliding leaflet plasty             Suture plication of anterior commissure and P1/P2             Sorin Memo 4D ring annuloplasty (size 29m, ref #[id number], serial ##[id number]   Coronary Artery Bypass Grafting x 1              Left Internal Mammary Artery to Distal Left Anterior Descending Coronary Artery   Maze Procedure              complete bilateral atrial lesion set using bipolar radiofrequency and cryothermy ablation             clipping of left atrial appendage (Atriclip size 436m  TEE 02/03/2020: 1. Left ventricular ejection fraction, by estimation, is 55 to 60%. The  left ventricle has normal function. The left ventricle has no regional  wall motion abnormalities. Left ventricular diastolic function could not  be evaluated.  2. Right ventricular systolic function is normal. The right ventricular  size is normal.  3. Left atrial size was severely dilated. No left atrial/left atrial  appendage thrombus was detected.  4. 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  5. No other significant valvular abnormality.  Coronary angiogram 02/03/2020: LM: Normal LAD: Mid LAD CTO with faint collaterals from Diag and RCA LCx: Prox OM1 focal 40% stenosis RCA: Normal  RA: 6 mmHg RV: 31/2 mmHg PA: 36/14 mmHg, mean PAP 23 mmHg PCW: Mean 16 mmHg with tall V wave LVEDP 7 mmHg CO: 5.4 L/min CI: 2.6 L/min.m2  Impression: Single vessel obstructive CAD (LAD CTO) Nonobstructive prox OM1 disease Tall V waves s/o severe mitral regurgitation  EKG 01/07/2020: Atrial fibrillation with controlled ventricular rate 85 bpm. Occasional ectopic ventricular beat. Poor R wave progression.  Recent labs: 11/05/2019: Glucose 93, BUN/Cr 23/0.8. EGFR normal. Na/K 140/4.3. Rest of the CMP normal H/H 13.7/39.6. MCV 88.2. Platelets 192 Chol 222, TG 65, HDL 65, LDL 145   Review of Systems  Cardiovascular: Positive for dyspnea on  exertion. Negative for chest pain, leg swelling, palpitations and syncope.  Respiratory: Positive for shortness of breath.        Vitals:   03/11/20 1001  BP: 117/63  Pulse: (!) 49  Temp: 97.7 F (36.5 C)  SpO2: 94%     Body mass index is 26.98 kg/m. Filed Weights   03/11/20 1001  Weight: 188 lb (85.3 kg)     Objective:   Physical Exam  Constitutional: He appears well-developed and well-nourished.  Neck: No JVD present.  Cardiovascular: Regular rhythm, normal heart sounds and intact distal pulses. Tachycardia present.  No murmur heard. Pulmonary/Chest: Effort normal and breath sounds normal. He has no wheezes. He has no rales.  Healing sternotomy scar. Sutures present.  Musculoskeletal:        General: No edema.  Nursing note and vitals reviewed.       Assessment & Recommendations:   64 y.o.Caucasianmalewith paroxysmal Afib parxysmal Afib, severe MR, CAD (LAD CTO), now s/p complex mitral valvuloplasty, CABGX1 (LIMA-LAD), Maze procedure.  Exertional dyspnea: I suspect his EF to be lower post MR, which is expected. This should improve over a period of time. He may need heart failure therapy Will discuss after echocardiogram.   Severe MR: Now s/p complex mitral valvoloplasty. No residual by MR by physical exam. Will check echocardiogram. Continue anticoagulation warfarin 5 mg daily.  Referred to cardiac rehab.  CAD: S/p CABG (LIMA-LAD) No angina symptoms.  Continue Aspirin 81 mg.   Paroxysmal Afib: S/p Maze procedure. In sinus tachycardia today. CHA2DS2VASc score 1, annual stroke risk 0.6%.  Recommend anticoagulation for 3 months (till June 2021) + Aspirin 81 mg. After that,, continue Aspirin 81 mg only.   Goal: 2.0-3.0 Indication: Afib Today's INR: 1.7 Current dose:5 mg daily  New dose: 7.5 mg M,W,F, 5 mg all other days  Next INR check: 2 weeks  F/u 2 weeks  Atalia Litzinger Esther Hardy, MD [facility] Pager: [phone] Office: [phone]

## 2020-03-15 ENCOUNTER — Other Ambulatory Visit: Payer: Self-pay | Admitting: *Deleted

## 2020-03-15 DIAGNOSIS — Z9889 Other specified postprocedural states: Secondary | ICD-10-CM

## 2020-03-15 MED ORDER — TRAMADOL HCL 50 MG PO TABS: 50.0000 mg | ORAL_TABLET | Freq: Every evening | ORAL | 0 refills | Status: AC | PRN

## 2020-03-17 ENCOUNTER — Ambulatory Visit: Payer: BC Managed Care – PPO

## 2020-03-17 ENCOUNTER — Other Ambulatory Visit: Payer: Self-pay

## 2020-03-17 DIAGNOSIS — Z9889 Other specified postprocedural states: Secondary | ICD-10-CM | POA: Diagnosis not present

## 2020-03-17 DIAGNOSIS — I340 Nonrheumatic mitral (valve) insufficiency: Secondary | ICD-10-CM | POA: Diagnosis not present

## 2020-03-17 NOTE — Telephone Encounter (Signed)
Called pt to see if he is interested in the cardiac rehab program pt stated that he is interested but he was on a walk and that he would call be back in the afternoon to schedule. Placed pt ppw in the ready to schedule bin.

## 2020-03-17 NOTE — Telephone Encounter (Signed)
Called patient to see if he was interested in participating in the Cardiac Rehab Program. Patient stated yes. Patient will come in for orientation on 04/01/20 @ 8AM and will attend the 7:00AM exercise class.  Mailed letter.

## 2020-03-19 ENCOUNTER — Telehealth: Payer: Self-pay

## 2020-03-19 ENCOUNTER — Ambulatory Visit: Payer: BC Managed Care – PPO | Admitting: Cardiology

## 2020-03-19 ENCOUNTER — Other Ambulatory Visit: Payer: Self-pay

## 2020-03-19 ENCOUNTER — Encounter: Payer: Self-pay | Admitting: Cardiology

## 2020-03-19 VITALS — BP 102/74 | HR 100 | Temp 97.8°F | Ht 70.0 in | Wt 189.0 lb

## 2020-03-19 DIAGNOSIS — I25810 Atherosclerosis of coronary artery bypass graft(s) without angina pectoris: Secondary | ICD-10-CM | POA: Diagnosis not present

## 2020-03-19 DIAGNOSIS — Z9889 Other specified postprocedural states: Secondary | ICD-10-CM | POA: Diagnosis not present

## 2020-03-19 DIAGNOSIS — I480 Paroxysmal atrial fibrillation: Secondary | ICD-10-CM

## 2020-03-19 NOTE — Telephone Encounter (Signed)
Lets do an acute visit with me today. Will need EKG.

## 2020-03-19 NOTE — Progress Notes (Signed)
Patient referred by Daniel Arabian, MD for exertional chest pain  Subjective:   Daniel Browning, male    DOB: [birth year], 64 y.o.   MRN: [id number]   Chief Complaint  Patient presents with  . Follow-up    HPI  64 y.o.Caucasianmalewith paroxysmal Afib, severe MR, CAD (LAD CTO), now s/p complex mitral valvuloplasty, CABGX1 (LIMA-LAD), Maze procedure.  Patient was seen by me last week, following his successful mitral valve repair and maze procedure.  I had suspected low EF based on his symptoms.  He is able to walk up to 2 hours on flat surface, but has dyspnea on an incline.  He also has occasional band apnea.  Yesterday, he was walking with a physician friend, when he felt lightheaded at top of the hill.  His physician friend suspected A. fib based on his pulse exam.   Current Outpatient Medications on File Prior to Visit  Medication Sig Dispense Refill  . acetaminophen (TYLENOL) 500 MG tablet Take 2 tablets (1,000 mg total) by mouth every 6 (six) hours. 30 tablet 0  . aspirin EC 81 MG EC tablet Take 1 tablet (81 mg total) by mouth daily.    . furosemide (LASIX) 40 MG tablet Take 1 tablet (40 mg total) by mouth daily for 3 days. 3 tablet 0  . metoprolol tartrate (LOPRESSOR) 25 MG tablet Take 0.5 tablets (12.5 mg total) by mouth 2 (two) times daily. 30 tablet 1  . omeprazole (PRILOSEC OTC) 20 MG tablet Take 20 mg by mouth daily.    . potassium chloride 20 MEQ TBCR Take 20 mEq by mouth daily for 3 days. 3 tablet 0  . traMADol (ULTRAM) 50 MG tablet Take 1 tablet (50 mg total) by mouth at bedtime as needed for up to 20 days. 20 tablet 0  . warfarin (COUMADIN) 5 MG tablet Take 1 tablet (5 mg total) by mouth daily at 6 PM. 30 tablet 3   No current facility-administered medications on file prior to visit.    Cardiovascular and other pertinent studies:  Echocardiogram 03/17/2020: Left ventricle cavity is normal in size. Mild concentric hypertrophy of the left ventricle. Abnormal septal  wall motion due to post-operative valve. Moderately depressed LV systolic function with EF 40%. Indeterminate diastolic function. Left atrial cavity is mildly dilated. S/p mitral annuloplasty. Mean PG 5 mmHg at 94 bpm, which is likely normal. No mitral valve regurgitation. IVC is dilated with a respiratory response of >50%. Estimated RA pressure 8 mmHg. Compared to previous study on 01/14/2020, mitral regurgitation is now absent. LVEF is reduced from 55% to 40%.  EKG 03/18/2020: Sinus rhythm 99 bpm. First degree AV block. Poor R wave progression. Occasional ectopic ventricular beat     02/19/2020 (Dr. Roxy Manns):  Mitral Valve Repair             Complex valvuloplasty including artificial Gore-tex neochord placement x8             Decalcification of posterior leaflet and posterior annulus             Sliding leaflet plasty             Suture plication of anterior commissure and P1/P2             Sorin Memo 4D ring annuloplasty (size 60m, ref #[id number], serial ##[id number]   Coronary Artery Bypass Grafting x 1              Left Internal Mammary Artery to Distal Left Anterior  Descending Coronary Artery   Maze Procedure              complete bilateral atrial lesion set using bipolar radiofrequency and cryothermy ablation             clipping of left atrial appendage (Atriclip size 3m)  TEE 02/03/2020: 1. Left ventricular ejection fraction, by estimation, is 55 to 60%. The  left ventricle has normal function. The left ventricle has no regional  wall motion abnormalities. Left ventricular diastolic function could not  be evaluated.  2. Right ventricular systolic function is normal. The right ventricular  size is normal.  3. Left atrial size was severely dilated. No left atrial/left atrial  appendage thrombus was detected.  4. 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  5. No other significant valvular abnormality.   Coronary angiogram  02/03/2020: LM: Normal LAD: Mid LAD CTO with faint collaterals from Diag and RCA LCx: Prox OM1 focal 40% stenosis RCA: Normal  RA: 6 mmHg RV: 31/2 mmHg PA: 36/14 mmHg, mean PAP 23 mmHg PCW: Mean 16 mmHg with tall V wave LVEDP 7 mmHg CO: 5.4 L/min CI: 2.6 L/min.m2  Impression: Single vessel obstructive CAD (LAD CTO) Nonobstructive prox OM1 disease Tall V waves s/o severe mitral regurgitation  EKG 01/07/2020: Atrial fibrillation with controlled ventricular rate 85 bpm. Occasional ectopic ventricular beat. Poor R wave progression.  Recent labs: 11/05/2019: Glucose 93, BUN/Cr 23/0.8. EGFR normal. Na/K 140/4.3. Rest of the CMP normal H/H 13.7/39.6. MCV 88.2. Platelets 192 Chol 222, TG 65, HDL 65, LDL 145   Review of Systems  Cardiovascular: Positive for dyspnea on exertion. Negative for chest pain, leg swelling, palpitations and syncope.  Respiratory: Positive for shortness of breath.        Vitals:   03/19/20 1311  BP: 102/74  Pulse: 100  Temp: 97.8 F (36.6 C)     Body mass index is 27.12 kg/m. Filed Weights   03/19/20 1311  Weight: 189 lb (85.7 kg)     Objective:   Physical Exam  Constitutional: He appears well-developed and well-nourished.  Neck: No JVD present.  Cardiovascular: Regular rhythm, normal heart sounds and intact distal pulses. Tachycardia present.  No murmur heard. Pulmonary/Chest: Effort normal and breath sounds normal. He has no wheezes. He has no rales.  Healing sternotomy scar. Sutures present.  Musculoskeletal:        General: No edema.  Nursing note and vitals reviewed.       Assessment & Recommendations:   64y.o.Caucasianmalewith parxysmal Afib, severe MR, CAD (LAD CTO), now s/p complex mitral valvuloplasty, CABGX1 (LIMA-LAD), Maze procedure.  Paroxysmal Afib: He is in regular rhythm on EKG without prominent P waves.  This is likely due to recovering left atrium s/p Maze procedure.  He is not in definite A. fib with  RVR.  No indication for cardioversion at this time. CHA2DS2VASc score 1, annual stroke risk 0.6%.  Recommend anticoagulation for 3 months (till June 2021) + Aspirin 81 mg. After that, continue Aspirin 81 mg only.   Exertional dyspnea: Postoperatively after mitral repair, his EF is lower at 40%.  This is not unexpected as EF of 55% in the setting of severe MR is falsely elevated.  I expect this to improve over the next several weeks.  Patient is reluctant to start any heart failure medical therapy at this time.  It is reasonable to wait for few weeks to see if there is any improvement in his symptoms before  starting heart failure therapy.  CAD: S/p CABG (LIMA-LAD) No angina symptoms.  Continue Aspirin 81 mg.   F/u in 6-8 weeks  Nigel Mormon, MD [facility] [place] Pager: [phone] Office: [phone]

## 2020-03-19 NOTE — Telephone Encounter (Signed)
Pt called to inform us that he just had an open heart surgery month ago. Pt mention he has been feeling tired and light headed and dizziness when walking, Pt mention he was walking with a friend that is a doctor he checked his pulse and told him he could be on A-fib pt has an appt with you next week he would like to know what should he do. Please advise.

## 2020-03-22 ENCOUNTER — Other Ambulatory Visit: Payer: Self-pay

## 2020-03-22 ENCOUNTER — Ambulatory Visit
Admission: RE | Admit: 2020-03-22 | Discharge: 2020-03-22 | Disposition: A | Discharge status: Home / Self Care | Payer: BC Managed Care – PPO | Source: Ambulatory Visit | Attending: Thoracic Surgery (Cardiothoracic Vascular Surgery) | Admitting: Thoracic Surgery (Cardiothoracic Vascular Surgery)

## 2020-03-22 ENCOUNTER — Other Ambulatory Visit: Payer: Self-pay | Admitting: Thoracic Surgery (Cardiothoracic Vascular Surgery)

## 2020-03-22 ENCOUNTER — Ambulatory Visit (INDEPENDENT_AMBULATORY_CARE_PROVIDER_SITE_OTHER): Payer: Self-pay | Admitting: Physician Assistant

## 2020-03-22 VITALS — BP 115/79 | HR 105 | Temp 97.9°F | Resp 20 | Ht 70.0 in | Wt 189.0 lb

## 2020-03-22 DIAGNOSIS — Z9889 Other specified postprocedural states: Secondary | ICD-10-CM

## 2020-03-22 DIAGNOSIS — Z951 Presence of aortocoronary bypass graft: Secondary | ICD-10-CM

## 2020-03-22 NOTE — Progress Notes (Signed)
HPI:  Patient returns for routine postoperative follow-up having undergone CABG x 1 and MV Repair and MAZE procedure by Dr. Roxy Manns on 02/19/2020.  The patient's early postoperative recovery while in the hospital was as expected.  However he did have issues with nausea during hospitalization attributed to constipation.  Since hospital discharge the patient reports he is doing pretty well.  He denies symptoms of heart failure.  He has had no further episodes of Atrial Fibrillation.  He does note that when he walks up an incline he has some mild dizziness.  His incisions are healing without evidence of infection, however he still has chest tube sutures in place.  The patient is already scheduled for cardiac rehab.  He asks if he can go to the beach this weekend.   Current Outpatient Medications  Medication Sig Dispense Refill  . acetaminophen (TYLENOL) 500 MG tablet Take 2 tablets (1,000 mg total) by mouth every 6 (six) hours. 30 tablet 0  . aspirin EC 81 MG EC tablet Take 1 tablet (81 mg total) by mouth daily.    . furosemide (LASIX) 40 MG tablet Take 1 tablet (40 mg total) by mouth daily for 3 days. 3 tablet 0  . metoprolol tartrate (LOPRESSOR) 25 MG tablet Take 0.5 tablets (12.5 mg total) by mouth 2 (two) times daily. 30 tablet 1  . omeprazole (PRILOSEC OTC) 20 MG tablet Take 20 mg by mouth daily.    . potassium chloride 20 MEQ TBCR Take 20 mEq by mouth daily for 3 days. 3 tablet 0  . traMADol (ULTRAM) 50 MG tablet Take 1 tablet (50 mg total) by mouth at bedtime as needed for up to 20 days. 20 tablet 0  . warfarin (COUMADIN) 5 MG tablet Take 1 tablet (5 mg total) by mouth daily at 6 PM. 30 tablet 3   No current facility-administered medications for this visit.    Physical Exam: BP 115/79 (BP Location: Right Arm, Patient Position: Sitting, Cuff Size: Normal)   Pulse (!) 105   Temp 97.9 F (36.6 C) (Temporal)   Resp 20   Ht 5\' 10"  (1.778 m)   Wt 189 lb (85.7 kg)   SpO2 100% Comment: RA  BMI  27.12 kg/m   Gen: no apparent distress Heart; RRR Lungs: CTA bilaterally Ext: no edema Incisions; healing without evidence of infection, CT sutures in place with thickened eschar  Diagnostic Tests:  CXR: trace left pleural effusion,    A/P:  1. S/P CABG x 1, MV Repair, MAZE- maintaining NSR, EKG last checked on 4/9 at Dr. Bonney Roussel... will continue Lopressor and coumadin for now.. plan will be to d/c coumadin in June per Dr. Bonney Roussel note 2. Minimal left pleural effusion, this should continue to resolve with time 3. Activity- can increase as tolerated, sternal precautions can be decreased 6 weeks from time of surgery, patient may start driving, okay to start cardiac rehab if he wishes 4. Dispo- patient stable, no further A. Fib, continue coumadin for now, will need ECHO in next few weeks, RTC with Dr. Roxy Manns in May per patient request to discuss possible RTW.. this can be done virtually if patient prefers   Ellwood Handler, PA-C [facility] [phone]

## 2020-03-22 NOTE — Patient Instructions (Signed)
You may return to driving an automobile as long as you are no longer requiring oral narcotic pain relievers during the daytime.  It would be wise to start driving only short distances during the daylight and gradually increase from there as you feel comfortable.   Endocarditis is a potentially serious infection of heart valves or inside lining of the heart.  It occurs more commonly in patients with diseased heart valves (such as patient's with aortic or mitral valve disease) and in patients who have undergone heart valve repair or replacement.  Certain surgical and dental procedures may put you at risk, such as dental cleaning, other dental procedures, or any surgery involving the respiratory, urinary, gastrointestinal tract, gallbladder or prostate gland.   To minimize your chances for develooping endocarditis, maintain good oral health and seek prompt medical attention for any infections involving the mouth, teeth, gums, skin or urinary tract.    Always notify your doctor or dentist about your underlying heart valve condition before having any invasive procedures. You will need to take antibiotics before certain procedures, including all routine dental cleanings or other dental procedures.  Your cardiologist or dentist should prescribe these antibiotics for you to be taken ahead of time.   You may continue to gradually increase your physical activity as tolerated.  Refrain from any heavy lifting or strenuous use of your arms and shoulders until at least 8 weeks from the time of your surgery, and avoid activities that cause increased pain in your chest on the side of your surgical incision.  Otherwise you may continue to increase activities without any particular limitations.  Increase the intensity and duration of physical activity gradually.

## 2020-03-23 ENCOUNTER — Ambulatory Visit: Payer: BC Managed Care – PPO

## 2020-03-24 ENCOUNTER — Ambulatory Visit: Payer: BC Managed Care – PPO | Admitting: Cardiology

## 2020-03-25 ENCOUNTER — Other Ambulatory Visit: Payer: Self-pay

## 2020-03-25 ENCOUNTER — Ambulatory Visit: Payer: BC Managed Care – PPO | Admitting: Pharmacist

## 2020-03-25 DIAGNOSIS — Z5181 Encounter for therapeutic drug level monitoring: Secondary | ICD-10-CM | POA: Diagnosis not present

## 2020-03-25 DIAGNOSIS — Z7901 Long term (current) use of anticoagulants: Secondary | ICD-10-CM | POA: Diagnosis not present

## 2020-03-25 DIAGNOSIS — I4891 Unspecified atrial fibrillation: Secondary | ICD-10-CM

## 2020-03-25 LAB — POCT INR: INR: 2.1 (ref 2.0–3.0)

## 2020-03-25 NOTE — Patient Instructions (Signed)
INR at goal. Continue with 7.5 mg M,W, and F and 5mg  all other days. Recheck INR in 2 week

## 2020-03-25 NOTE — Progress Notes (Signed)
Anticoagulation Management Daniel Browning is a 64 y.o. male who reports to the clinic for monitoring of warfarin treatment.    Indication: Paroxysmal AFib s/p Maze procedure CHA2DS2 Vasc Score 1, HAS-BLED 0  Duration: 3 months (till June 2021) Supervising physician: [facility] Visit History:  Patient does not report signs/symptoms of bleeding or thromboembolism   Other recent changes: No changes in diet, medications, lifestyle  Anticoagulation Episode Summary    Current INR goal:  2.0-3.0  TTR:  18.0 % (2.7 wk)  Next INR check:  04/08/2020  INR from last check:  2.1 (03/25/2020)  Weekly max warfarin dose:  5 mg  Target end date:    INR check location:    Preferred lab:    Send INR reminders to:     Indications   Atrial fibrillation ([facility]) [I48.91]       Comments:          No Known Allergies  Current Outpatient Medications:  .  acetaminophen (TYLENOL) 500 MG tablet, Take 2 tablets (1,000 mg total) by mouth every 6 (six) hours., Disp: 30 tablet, Rfl: 0 .  aspirin EC 81 MG EC tablet, Take 1 tablet (81 mg total) by mouth daily., Disp: , Rfl:  .  metoprolol tartrate (LOPRESSOR) 25 MG tablet, Take 0.5 tablets (12.5 mg total) by mouth 2 (two) times daily., Disp: 30 tablet, Rfl: 1 .  omeprazole (PRILOSEC OTC) 20 MG tablet, Take 20 mg by mouth daily., Disp: , Rfl:  .  traMADol (ULTRAM) 50 MG tablet, Take 1 tablet (50 mg total) by mouth at bedtime as needed for up to 20 days., Disp: 20 tablet, Rfl: 0 .  warfarin (COUMADIN) 5 MG tablet, Take 1 tablet (5 mg total) by mouth daily at 6 PM. (Patient taking differently: Take 5 mg by mouth daily at 6 PM. 7.5 on MWF), Disp: 30 tablet, Rfl: 3 Past Medical History:  Diagnosis Date  . Atrial fibrillation ([place])   . Colon polyps   . Coronary artery disease   . GERD (gastroesophageal reflux disease)   . Heart murmur   . Mitral valve prolapse   . Nonrheumatic mitral valve regurgitation   . Persistent atrial  fibrillation ([place])   . S/P CABG x 1 02/19/2020   LIMA to LAD  . S/P Maze operation for atrial fibrillation 02/19/2020   Complete bilateral atrial lesion set using bipolar radiofrequency ablation and cryothermy with clipping of LA appendage via median sternotomy approach  . S/P mitral valve repair 02/19/2020   Complex valvuloplasty including artificial Gore-tex neochord placement x8, decalcification of posterior leaflet and posterior annulus with sliding leaflet plasty and 34 mm Sorin Memo 4D ring annuloplasty    ASSESSMENT  Recent Results: The most recent result is correlated with 42.5 mg per week:    Lab Results  Component Value Date   INR 2.1 03/25/2020   INR 1.7 (A) 03/11/2020   INR 2.0 03/04/2020    Anticoagulation Dosing: Description   INR at goal. Continue with 7.5 mg M,W, and F and 5mg  all other days. Recheck INR in 2 week       INR today: Therapeutic following dose increase last visit.  PLAN Weekly dose was unchanged. Continue taking 7.5 mg every Mon, Wed, Fri and 5 mg all other days. Recheck INR in 2 weeks.  Patient Instructions  INR at goal. Continue with 7.5 mg M,W, and F and 5mg  all other days. Recheck INR in 2 week   Patient advised to  contact clinic or seek medical attention if signs/symptoms of bleeding or thromboembolism occur.  Patient verbalized understanding by repeating back information and was advised to contact me if further medication-related questions arise.   Follow-up Return in about 2 weeks (around 04/08/2020).  Alysia Penna, PharmD  15 minutes spent face-to-face with the patient during the encounter. 50% of time spent on education, including signs/sx bleeding and clotting, as well as food and drug interactions with warfarin. 50% of time was spent on fingerprick POC INR sample collection,processing, results determination, and documentation

## 2020-03-29 ENCOUNTER — Telehealth (HOSPITAL_COMMUNITY): Payer: Self-pay

## 2020-03-29 NOTE — Telephone Encounter (Signed)
Unable to reach patient prior to visit

## 2020-03-31 ENCOUNTER — Other Ambulatory Visit: Payer: Self-pay

## 2020-03-31 ENCOUNTER — Encounter (HOSPITAL_COMMUNITY)
Admission: RE | Admit: 2020-03-31 | Discharge: 2020-03-31 | Disposition: A | Discharge status: Home / Self Care | Payer: BC Managed Care – PPO | Source: Ambulatory Visit | Attending: Cardiology | Admitting: Cardiology

## 2020-03-31 DIAGNOSIS — Z951 Presence of aortocoronary bypass graft: Secondary | ICD-10-CM | POA: Insufficient documentation

## 2020-03-31 DIAGNOSIS — Z9889 Other specified postprocedural states: Secondary | ICD-10-CM | POA: Insufficient documentation

## 2020-03-31 NOTE — Progress Notes (Signed)
Cardiac Rehab Note:  Successful telephone encounter to HOOPER GRIESEL to confirm Cardiac Rehab orientation appointment for 04/01/20 at 8:00. Nursing assessment completed. Patient questions answered. Instructions for appointment provided. Patient screening for Covid-19 negative.  Arlone Lenhardt E. Rollene Rotunda RN, BSN [place]. [facility]  Cardiac and Pulmonary Rehabilitation Phone: [phone] Fax: [phone]

## 2020-04-01 ENCOUNTER — Telehealth: Payer: Self-pay

## 2020-04-01 ENCOUNTER — Encounter (HOSPITAL_COMMUNITY): Payer: Self-pay

## 2020-04-01 ENCOUNTER — Encounter (HOSPITAL_COMMUNITY)
Admission: RE | Admit: 2020-04-01 | Discharge: 2020-04-01 | Disposition: A | Discharge status: Home / Self Care | Payer: BC Managed Care – PPO | Source: Ambulatory Visit | Attending: Cardiology | Admitting: Cardiology

## 2020-04-01 VITALS — BP 104/72 | HR 91 | Temp 97.0°F | Ht 69.75 in | Wt 191.4 lb

## 2020-04-01 DIAGNOSIS — Z951 Presence of aortocoronary bypass graft: Secondary | ICD-10-CM

## 2020-04-01 DIAGNOSIS — Z9889 Other specified postprocedural states: Secondary | ICD-10-CM

## 2020-04-01 NOTE — Progress Notes (Signed)
Cardiac Individual Treatment Plan  Patient Details  Name: Daniel Browning MRN: [id number] Date of Birth: 1955-12-21 Referring Provider:     [name] from 04/01/2020 in [facility]  Referring Provider  [place], [place] J      Initial Encounter Date:    CARDIAC REHAB PHASE II ORIENTATION from 04/01/2020 in [facility]  Date  04/01/20      Visit Diagnosis: S/P CABG x 1 02/19/20  S/P mitral valve repair, Maze procedure 02/19/20  Patient's Home Medications on Admission:  Current Outpatient Medications:  .  acetaminophen (TYLENOL) 500 MG tablet, Take 2 tablets (1,000 mg total) by mouth every 6 (six) hours., Disp: 30 tablet, Rfl: 0 .  aspirin EC 81 MG EC tablet, Take 1 tablet (81 mg total) by mouth daily., Disp: , Rfl:  .  metoprolol tartrate (LOPRESSOR) 25 MG tablet, Take 0.5 tablets (12.5 mg total) by mouth 2 (two) times daily., Disp: 30 tablet, Rfl: 1 .  omeprazole (PRILOSEC OTC) 20 MG tablet, Take 20 mg by mouth daily., Disp: , Rfl:  .  traMADol (ULTRAM) 50 MG tablet, Take 1 tablet (50 mg total) by mouth at bedtime as needed for up to 20 days., Disp: 20 tablet, Rfl: 0 .  warfarin (COUMADIN) 5 MG tablet, Take 1 tablet (5 mg total) by mouth daily at 6 PM. (Patient taking differently: Take 5 mg by mouth daily at 6 PM. 7.5 on MWF), Disp: 30 tablet, Rfl: 3  Past Medical History: Past Medical History:  Diagnosis Date  . Atrial fibrillation ([place])   . Colon polyps   . Coronary artery disease   . GERD (gastroesophageal reflux disease)   . Heart murmur   . Mitral valve prolapse   . Nonrheumatic mitral valve regurgitation   . Persistent atrial fibrillation ([place])   . S/P CABG x 1 02/19/2020   LIMA to LAD  . S/P Maze operation for atrial fibrillation 02/19/2020   Complete bilateral atrial lesion set using bipolar radiofrequency ablation and cryothermy with clipping of LA appendage via median sternotomy  approach  . S/P mitral valve repair 02/19/2020   Complex valvuloplasty including artificial Gore-tex neochord placement x8, decalcification of posterior leaflet and posterior annulus with sliding leaflet plasty and 34 mm Sorin Memo 4D ring annuloplasty    Tobacco Use: Social History   Tobacco Use  Smoking Status Never Smoker  Smokeless Tobacco Never Used    Labs: Recent Review Flowsheet Data    Labs for ITP Cardiac and Pulmonary Rehab Latest Ref Rng & Units 02/19/2020 02/19/2020 02/19/2020 02/19/2020 02/19/2020   Hemoglobin A1c 4.8 - 5.6 % - - - - -   PHART 7.350 - 7.450 - 7.332(L) 7.292(L) 7.377 7.341(L)   PCO2ART 32.0 - 48.0 mmHg - 43.7 46.0 32.7 36.3   HCO3 20.0 - 28.0 mmol/L - 23.1 22.1 19.2(L) 19.6(L)   TCO2 22 - 32 mmol/L 24 24 23  20(L) 21(L)   ACIDBASEDEF 0.0 - 2.0 mmol/L - 3.0(H) 4.0(H) 5.0(H) 6.0(H)   O2SAT % - 100.0 90.0 96.0 97.0      Capillary Blood Glucose: Lab Results  Component Value Date   GLUCAP 116 (H) 02/23/2020   GLUCAP 97 02/23/2020   GLUCAP 139 (H) 02/23/2020   GLUCAP 106 (H) 02/23/2020   GLUCAP 142 (H) 02/22/2020     Exercise Target Goals: Exercise Program Goal: Individual exercise prescription set using results from initial 6 min walk test and THRR while considering  patient's activity barriers and safety.   Exercise Prescription Goal: Starting with aerobic activity 30 plus minutes a day, 3 days per week for initial exercise prescription. Provide home exercise prescription and guidelines that participant acknowledges understanding prior to discharge.  Activity Barriers & Risk Stratification: Activity Barriers & Cardiac Risk Stratification - 04/01/20 1038      Activity Barriers & Cardiac Risk Stratification   Activity Barriers  None    Cardiac Risk Stratification  High       6 Minute Walk: 6 Minute Walk    Row Name 04/01/20 1036         6 Minute Walk   Phase  Initial     Distance  1702 feet     Walk Time  6 minutes     # of Rest Breaks   0     MPH  3.2     METS  4     RPE  7     Perceived Dyspnea   0     VO2 Peak  14.03     Symptoms  No     Resting HR  91 bpm     Resting BP  104/72     Resting Oxygen Saturation   100 %     Exercise Oxygen Saturation  during 6 min walk  100 %     Max Ex. HR  102 bpm     Max Ex. BP  120/60     2 Minute Post BP  110/70        Oxygen Initial Assessment:   Oxygen Re-Evaluation:   Oxygen Discharge (Final Oxygen Re-Evaluation):   Initial Exercise Prescription: Initial Exercise Prescription - 04/01/20 1000      Date of Initial Exercise RX and Referring Provider   Date  04/01/20    Referring Provider  Nigel Mormon    Expected Discharge Date  06/04/20      Treadmill   MPH  2.3    Grade  1    Minutes  15    METs  3.08      NuStep   Level  2    SPM  85    Minutes  15    METs  3      Prescription Details   Frequency (times per week)  3x    Duration  Progress to 30 minutes of continuous aerobic without signs/symptoms of physical distress      Intensity   THRR 40-80% of Max Heartrate  63-126    Ratings of Perceived Exertion  11-13    Perceived Dyspnea  0-4      Progression   Progression  Continue progressive overload as per policy without signs/symptoms or physical distress.      Resistance Training   Training Prescription  Yes    Weight  4lbs    Reps  10-15       Perform Capillary Blood Glucose checks as needed.  Exercise Prescription Changes:   Exercise Comments:   Exercise Goals and Review:  Exercise Goals    Row Name 04/01/20 1038             Exercise Goals   Increase Physical Activity  Yes       Intervention  Provide advice, education, support and counseling about physical activity/exercise needs.;Develop an individualized exercise prescription for aerobic and resistive training based on initial evaluation findings, risk stratification, comorbidities and participant's personal goals.       Expected Outcomes  Short Term: Attend rehab  on a regular basis to increase amount of physical activity.;Long Term: Exercising regularly at least 3-5 days a week.;Long Term: Add in home exercise to make exercise part of routine and to increase amount of physical activity.       Increase Strength and Stamina  Yes       Intervention  Provide advice, education, support and counseling about physical activity/exercise needs.;Develop an individualized exercise prescription for aerobic and resistive training based on initial evaluation findings, risk stratification, comorbidities and participant's personal goals.       Expected Outcomes  Short Term: Increase workloads from initial exercise prescription for resistance, speed, and METs.;Short Term: Perform resistance training exercises routinely during rehab and add in resistance training at home;Long Term: Improve cardiorespiratory fitness, muscular endurance and strength as measured by increased METs and functional capacity (6MWT)       Able to understand and use rate of perceived exertion (RPE) scale  Yes       Intervention  Provide education and explanation on how to use RPE scale       Expected Outcomes  Short Term: Able to use RPE daily in rehab to express subjective intensity level;Long Term:  Able to use RPE to guide intensity level when exercising independently       Knowledge and understanding of Target Heart Rate Range (THRR)  Yes       Intervention  Provide education and explanation of THRR including how the numbers were predicted and where they are located for reference       Expected Outcomes  Short Term: Able to state/look up THRR;Long Term: Able to use THRR to govern intensity when exercising independently;Short Term: Able to use daily as guideline for intensity in rehab       Able to check pulse independently  Yes       Intervention  Provide education and demonstration on how to check pulse in carotid and radial arteries.;Review the importance of being able to check your own pulse for safety  during independent exercise       Expected Outcomes  Short Term: Able to explain why pulse checking is important during independent exercise;Long Term: Able to check pulse independently and accurately       Understanding of Exercise Prescription  Yes       Intervention  Provide education, explanation, and written materials on patient's individual exercise prescription       Expected Outcomes  Short Term: Able to explain program exercise prescription;Long Term: Able to explain home exercise prescription to exercise independently          Exercise Goals Re-Evaluation :    Discharge Exercise Prescription (Final Exercise Prescription Changes):   Nutrition:  Target Goals: Understanding of nutrition guidelines, daily intake of sodium 1500mg , cholesterol 200mg , calories 30% from fat and 7% or less from saturated fats, daily to have 5 or more servings of fruits and vegetables.  Biometrics: Pre Biometrics - 04/01/20 1038      Pre Biometrics   Height  5' 9.75" (1.772 m)    Weight  86.8 kg    Waist Circumference  40 inches    Hip Circumference  42 inches    Waist to Hip Ratio  0.95 %    BMI (Calculated)  27.64    Triceps Skinfold  12 mm    % Body Fat  37 %    Grip Strength  42 kg    Flexibility  12.5 in  Single Leg Stand  94 seconds        Nutrition Therapy Plan and Nutrition Goals:   Nutrition Assessments:   Nutrition Goals Re-Evaluation:   Nutrition Goals Discharge (Final Nutrition Goals Re-Evaluation):   Psychosocial: Target Goals: Acknowledge presence or absence of significant depression and/or stress, maximize coping skills, provide positive support system. Participant is able to verbalize types and ability to use techniques and skills needed for reducing stress and depression.  Initial Review & Psychosocial Screening: Initial Psych Review & Screening - 04/01/20 1138      Initial Review   Current issues with  None Identified      Family Dynamics   Good Support  System?  Yes   Jyshawn has his wife for support     Barriers   Psychosocial barriers to participate in program  There are no identifiable barriers or psychosocial needs.      Screening Interventions   Interventions  Encouraged to exercise       Quality of Life Scores: Quality of Life - 04/01/20 1039      Quality of Life   Select  Quality of Life      Quality of Life Scores   Health/Function Pre  22.63 %    Socioeconomic Pre  25.21 %    Psych/Spiritual Pre  25.21 %    Family Pre  22.6 %    GLOBAL Pre  23.79 %      Scores of 19 and below usually indicate a poorer quality of life in these areas.  A difference of  2-3 points is a clinically meaningful difference.  A difference of 2-3 points in the total score of the Quality of Life Index has been associated with significant improvement in overall quality of life, self-image, physical symptoms, and general health in studies assessing change in quality of life.  PHQ-9: Recent Review Flowsheet Data    Depression screen [facility] 2/9 04/01/2020   Decreased Interest 0   Down, Depressed, Hopeless 0   PHQ - 2 Score 0     Interpretation of Total Score  Total Score Depression Severity:  1-4 = Minimal depression, 5-9 = Mild depression, 10-14 = Moderate depression, 15-19 = Moderately severe depression, 20-27 = Severe depression   Psychosocial Evaluation and Intervention:   Psychosocial Re-Evaluation:   Psychosocial Discharge (Final Psychosocial Re-Evaluation):   Vocational Rehabilitation: Provide vocational rehab assistance to qualifying candidates.   Vocational Rehab Evaluation & Intervention: Vocational Rehab - 04/01/20 1139      Initial Vocational Rehab Evaluation & Intervention   Assessment shows need for Vocational Rehabilitation  No       Education: Education Goals: Education classes will be provided on a weekly basis, covering required topics. Participant will state understanding/return demonstration of topics  presented.  Learning Barriers/Preferences: Learning Barriers/Preferences - 04/01/20 1040      Learning Barriers/Preferences   Learning Barriers  Sight    Learning Preferences  Written Material;Skilled Demonstration;Individual Instruction       Education Topics: Hypertension, Hypertension Reduction -Define heart disease and high blood pressure. Discus how high blood pressure affects the body and ways to reduce high blood pressure.   Exercise and Your Heart -Discuss why it is important to exercise, the FITT principles of exercise, normal and abnormal responses to exercise, and how to exercise safely.   Angina -Discuss definition of angina, causes of angina, treatment of angina, and how to decrease risk of having angina.   Cardiac Medications -Review what the following cardiac medications are  used for, how they affect the body, and side effects that may occur when taking the medications.  Medications include Aspirin, Beta blockers, calcium channel blockers, ACE Inhibitors, angiotensin receptor blockers, diuretics, digoxin, and antihyperlipidemics.   Congestive Heart Failure -Discuss the definition of CHF, how to live with CHF, the signs and symptoms of CHF, and how keep track of weight and sodium intake.   Heart Disease and Intimacy -Discus the effect sexual activity has on the heart, how changes occur during intimacy as we age, and safety during sexual activity.   Smoking Cessation / COPD -Discuss different methods to quit smoking, the health benefits of quitting smoking, and the definition of COPD.   Nutrition I: Fats -Discuss the types of cholesterol, what cholesterol does to the heart, and how cholesterol levels can be controlled.   Nutrition II: Labels -Discuss the different components of food labels and how to read food label   Heart Parts/Heart Disease and PAD -Discuss the anatomy of the heart, the pathway of blood circulation through the heart, and these are  affected by heart disease.   Stress I: Signs and Symptoms -Discuss the causes of stress, how stress may lead to anxiety and depression, and ways to limit stress.   Stress II: Relaxation -Discuss different types of relaxation techniques to limit stress.   Warning Signs of Stroke / TIA -Discuss definition of a stroke, what the signs and symptoms are of a stroke, and how to identify when someone is having stroke.   Knowledge Questionnaire Score: Knowledge Questionnaire Score - 04/01/20 1040      Knowledge Questionnaire Score   Pre Score  21/24       Core Components/Risk Factors/Patient Goals at Admission: Personal Goals and Risk Factors at Admission - 04/01/20 1139      Core Components/Risk Factors/Patient Goals on Admission    Weight Management  Yes;Weight Maintenance    Intervention  Weight Management: Develop a combined nutrition and exercise program designed to reach desired caloric intake, while maintaining appropriate intake of nutrient and fiber, sodium and fats, and appropriate energy expenditure required for the weight goal.;Weight Management: Provide education and appropriate resources to help participant work on and attain dietary goals.    Expected Outcomes  Weight Maintenance: Understanding of the daily nutrition guidelines, which includes 25-35% calories from fat, 7% or less cal from saturated fats, less than 200mg  cholesterol, less than 1.5gm of sodium, & 5 or more servings of fruits and vegetables daily;Understanding recommendations for meals to include 15-35% energy as protein, 25-35% energy from fat, 35-60% energy from carbohydrates, less than 200mg  of dietary cholesterol, 20-35 gm of total fiber daily;Understanding of distribution of calorie intake throughout the day with the consumption of 4-5 meals/snacks       Core Components/Risk Factors/Patient Goals Review:    Core Components/Risk Factors/Patient Goals at Discharge (Final Review):    ITP Comments: ITP  Comments    Row Name 04/01/20 1035 04/01/20 1135         ITP Comments  Dr. Fransico Him, Medical Director  Dr. Fransico Him MD, Medical Director         Comments: Dominica Severin attended orientation on 04/01/2020 to review rules and guidelines for program.  Completed 6 minute walk test, Intitial ITP, and exercise prescription.  VSS. Telemetry-Sinus Rhythm with a first degree heart block intermittent to frequent PVC's  And couplets were noted during Gabriela's actual walk test.  Asymptomatic. Please see previous note as today's ECG tracings were sent to Dr Minerva Ends office  for review. Safety measures and social distancing in place per CDC guidelines.Barnet Pall, RN,BSN 04/01/2020 12:00 PM

## 2020-04-01 NOTE — Telephone Encounter (Signed)
Barnet Pall, RN  [phone]   Sending strips over for Dr. Virgina Jock to review.

## 2020-04-01 NOTE — Progress Notes (Signed)
Intermittent frequent PVC's and couplets noted during Rylei's walk test. Patient asymptomatic. VSS. Dr Bonney Roussel office called and notified. Spoke with [employer]. Will fax exercise flow sheets to Dr. Bonney Roussel  office for review.Barnet Pall, RN,BSN 04/01/2020 11:48 AM

## 2020-04-07 ENCOUNTER — Other Ambulatory Visit: Payer: Self-pay

## 2020-04-08 ENCOUNTER — Ambulatory Visit: Payer: BC Managed Care – PPO | Admitting: Pharmacist

## 2020-04-08 ENCOUNTER — Other Ambulatory Visit: Payer: Self-pay | Admitting: Pharmacist

## 2020-04-08 ENCOUNTER — Other Ambulatory Visit: Payer: Self-pay

## 2020-04-08 ENCOUNTER — Encounter: Payer: Self-pay | Admitting: Cardiology

## 2020-04-08 ENCOUNTER — Encounter: Payer: Self-pay | Admitting: Pharmacist

## 2020-04-08 DIAGNOSIS — I4891 Unspecified atrial fibrillation: Secondary | ICD-10-CM

## 2020-04-08 DIAGNOSIS — Z9889 Other specified postprocedural states: Secondary | ICD-10-CM

## 2020-04-08 DIAGNOSIS — Z7901 Long term (current) use of anticoagulants: Secondary | ICD-10-CM | POA: Diagnosis not present

## 2020-04-08 DIAGNOSIS — Z5181 Encounter for therapeutic drug level monitoring: Secondary | ICD-10-CM | POA: Diagnosis not present

## 2020-04-08 LAB — POCT INR: INR: 1.9 — AB (ref 2.0–3.0)

## 2020-04-08 NOTE — Patient Instructions (Signed)
INR below goal. Continue with 5 mg M,W, and F and 7.5 mg all other days. Recheck INR in 2 week

## 2020-04-08 NOTE — Progress Notes (Signed)
Anticoagulation Management Daniel Browning is a 64 y.o. male who reports to the clinic for monitoring of warfarin treatment.    Indication: Paroxysmal AFib s/p Maze procedure CHA2DS2 Vasc Score 1, HAS-BLED 0  Duration: 3 months (till June 2021) Supervising physician: [facility] Visit History:  Patient does not report signs/symptoms of bleeding or thromboembolism   Other recent changes: No changes in diet, medications, lifestyle. Pt is scheduled for a dental workup on Monday 5/3. Pt is only scheduled for a routine cleaning appt. Pt had an artificial Gore-tex neochord placement x8 on 02/19/20. Per American Dental Association guidelines, pt's with prosthetic material used for cardiac valve repair, such as annuloplasty rings and chords are recommended to be on preventative abx prior to certain dental procedures. Pt requested letter clearing him for the procedure and for PPx Abx sent in on behalf of the pt.   Anticoagulation Episode Summary    Current INR goal:  2.0-3.0  TTR:  31.4 % (1.1 mo)  Next INR check:  04/22/2020  INR from last check:  1.9 (04/08/2020)  Weekly max warfarin dose:  5 mg  Target end date:    INR check location:    Preferred lab:    Send INR reminders to:     Indications   Atrial fibrillation ([facility]) [I48.91]       Comments:          No Known Allergies  Current Outpatient Medications:  .  acetaminophen (TYLENOL) 500 MG tablet, Take 2 tablets (1,000 mg total) by mouth every 6 (six) hours., Disp: 30 tablet, Rfl: 0 .  aspirin EC 81 MG EC tablet, Take 1 tablet (81 mg total) by mouth daily., Disp: , Rfl:  .  metoprolol tartrate (LOPRESSOR) 25 MG tablet, Take 0.5 tablets (12.5 mg total) by mouth 2 (two) times daily., Disp: 30 tablet, Rfl: 1 .  nitroGLYCERIN (NITROSTAT) 0.4 MG SL tablet, Place under the tongue., Disp: , Rfl:  .  omeprazole (PRILOSEC OTC) 20 MG tablet, Take 20 mg by mouth daily., Disp: , Rfl:  .  warfarin (COUMADIN) 5 MG  tablet, Take 1 tablet (5 mg total) by mouth daily at 6 PM. (Patient taking differently: Take 5 mg by mouth daily at 6 PM. 7.5 on MWF), Disp: 30 tablet, Rfl: 3 Past Medical History:  Diagnosis Date  . Atrial fibrillation ([place])   . Colon polyps   . Coronary artery disease   . GERD (gastroesophageal reflux disease)   . Heart murmur   . Mitral valve prolapse   . Nonrheumatic mitral valve regurgitation   . Persistent atrial fibrillation ([place])   . S/P CABG x 1 02/19/2020   LIMA to LAD  . S/P Maze operation for atrial fibrillation 02/19/2020   Complete bilateral atrial lesion set using bipolar radiofrequency ablation and cryothermy with clipping of LA appendage via median sternotomy approach  . S/P mitral valve repair 02/19/2020   Complex valvuloplasty including artificial Gore-tex neochord placement x8, decalcification of posterior leaflet and posterior annulus with sliding leaflet plasty and 34 mm Sorin Memo 4D ring annuloplasty    ASSESSMENT  Recent Results: The most recent result is correlated with 42.5 mg per week:  Lab Results  Component Value Date   INR 1.9 (A) 04/08/2020   INR 2.1 03/25/2020   INR 1.7 (A) 03/11/2020    Anticoagulation Dosing: Description   INR below goal. Continue with 5 mg M,W, and F and 7.5 mg all other days. Recheck INR in 2 week  INR today: Subtherapeutic. Pt continues to be on the lower end of range for the past few visits. Denies any recent missed doses or skipped doses. Denies any changes in diet, medications, or lifestyle.   PLAN Weekly dose was increased by 5.9%. Increase dose to 5 mg every Mon, Wed, Fri and 7.5 mg all other days. Recheck INR in 2 weeks.  There are no Patient Instructions on file for this visit. Patient advised to contact clinic or seek medical attention if signs/symptoms of bleeding or thromboembolism occur.  Patient verbalized understanding by repeating back information and was advised to contact me if further  medication-related questions arise.   Follow-up Return in about 2 weeks (around 04/22/2020).  Alysia Penna, PharmD  15 minutes spent face-to-face with the patient during the encounter. 50% of time spent on education, including signs/sx bleeding and clotting, as well as food and drug interactions with warfarin. 50% of time was spent on fingerprick POC INR sample collection,processing, results determination, and documentation

## 2020-04-09 ENCOUNTER — Other Ambulatory Visit: Payer: Self-pay | Admitting: Cardiology

## 2020-04-09 DIAGNOSIS — Z9889 Other specified postprocedural states: Secondary | ICD-10-CM

## 2020-04-09 MED ORDER — AMOXICILLIN 500 MG PO TABS: 500.0000 mg | ORAL_TABLET | Freq: Once | ORAL | 4 refills | Status: DC | PRN

## 2020-04-12 ENCOUNTER — Encounter (HOSPITAL_COMMUNITY)
Admission: RE | Admit: 2020-04-12 | Discharge: 2020-04-12 | Disposition: A | Discharge status: Home / Self Care | Payer: BC Managed Care – PPO | Source: Ambulatory Visit | Attending: Cardiology | Admitting: Cardiology

## 2020-04-12 ENCOUNTER — Other Ambulatory Visit: Payer: Self-pay

## 2020-04-12 DIAGNOSIS — Z9889 Other specified postprocedural states: Secondary | ICD-10-CM | POA: Insufficient documentation

## 2020-04-12 DIAGNOSIS — Z951 Presence of aortocoronary bypass graft: Secondary | ICD-10-CM | POA: Diagnosis not present

## 2020-04-12 NOTE — Progress Notes (Signed)
Cardiac Individual Treatment Plan  Patient Details  Name: Daniel Browning MRN: [id number] Date of Birth: May 21, 1956 Referring Provider:     [place] from 04/01/2020 in [facility]  Referring Provider  [place], [place] J      Initial Encounter Date:    CARDIAC REHAB PHASE II ORIENTATION from 04/01/2020 in [facility]  Date  04/01/20      Visit Diagnosis: S/P CABG x 1 02/19/20  S/P mitral valve repair, Maze procedure 02/19/20  Patient's Home Medications on Admission:  Current Outpatient Medications:  .  acetaminophen (TYLENOL) 500 MG tablet, Take 2 tablets (1,000 mg total) by mouth every 6 (six) hours., Disp: 30 tablet, Rfl: 0 .  amoxicillin (AMOXIL) 500 MG tablet, Take 1 tablet (500 mg total) by mouth once as needed for up to 1 dose. 1 hour before dental procedure, Disp: 4 tablet, Rfl: 4 .  aspirin EC 81 MG EC tablet, Take 1 tablet (81 mg total) by mouth daily., Disp: , Rfl:  .  metoprolol tartrate (LOPRESSOR) 25 MG tablet, Take 0.5 tablets (12.5 mg total) by mouth 2 (two) times daily., Disp: 30 tablet, Rfl: 1 .  nitroGLYCERIN (NITROSTAT) 0.4 MG SL tablet, Place under the tongue., Disp: , Rfl:  .  omeprazole (PRILOSEC OTC) 20 MG tablet, Take 20 mg by mouth daily., Disp: , Rfl:  .  warfarin (COUMADIN) 5 MG tablet, Take 1 tablet (5 mg total) by mouth daily at 6 PM. (Patient taking differently: Take 5 mg by mouth daily at 6 PM. 7.5 on MWF), Disp: 30 tablet, Rfl: 3  Past Medical History: Past Medical History:  Diagnosis Date  . Atrial fibrillation ([place])   . Colon polyps   . Coronary artery disease   . GERD (gastroesophageal reflux disease)   . Heart murmur   . Mitral valve prolapse   . Nonrheumatic mitral valve regurgitation   . Persistent atrial fibrillation ([place])   . S/P CABG x 1 02/19/2020   LIMA to LAD  . S/P Maze operation for atrial fibrillation 02/19/2020   Complete bilateral atrial lesion  set using bipolar radiofrequency ablation and cryothermy with clipping of LA appendage via median sternotomy approach  . S/P mitral valve repair 02/19/2020   Complex valvuloplasty including artificial Gore-tex neochord placement x8, decalcification of posterior leaflet and posterior annulus with sliding leaflet plasty and 34 mm Sorin Memo 4D ring annuloplasty    Tobacco Use: Social History   Tobacco Use  Smoking Status Never Smoker  Smokeless Tobacco Never Used    Labs: Recent Review Flowsheet Data    Labs for ITP Cardiac and Pulmonary Rehab Latest Ref Rng & Units 02/19/2020 02/19/2020 02/19/2020 02/19/2020 02/19/2020   Hemoglobin A1c 4.8 - 5.6 % - - - - -   PHART 7.350 - 7.450 - 7.332(L) 7.292(L) 7.377 7.341(L)   PCO2ART 32.0 - 48.0 mmHg - 43.7 46.0 32.7 36.3   HCO3 20.0 - 28.0 mmol/L - 23.1 22.1 19.2(L) 19.6(L)   TCO2 22 - 32 mmol/L 24 24 23  20(L) 21(L)   ACIDBASEDEF 0.0 - 2.0 mmol/L - 3.0(H) 4.0(H) 5.0(H) 6.0(H)   O2SAT % - 100.0 90.0 96.0 97.0      Capillary Blood Glucose: Lab Results  Component Value Date   GLUCAP 116 (H) 02/23/2020   GLUCAP 97 02/23/2020   GLUCAP 139 (H) 02/23/2020   GLUCAP 106 (H) 02/23/2020   GLUCAP 142 (H) 02/22/2020     Exercise Target Goals:  Exercise Program Goal: Individual exercise prescription set using results from initial 6 min walk test and THRR while considering  patient's activity barriers and safety.   Exercise Prescription Goal: Initial exercise prescription builds to 30-45 minutes a day of aerobic activity, 2-3 days per week.  Home exercise guidelines will be given to patient during program as part of exercise prescription that the participant will acknowledge.  Activity Barriers & Risk Stratification: Activity Barriers & Cardiac Risk Stratification - 04/01/20 1038      Activity Barriers & Cardiac Risk Stratification   Activity Barriers  None    Cardiac Risk Stratification  High       6 Minute Walk: 6 Minute Walk    Row Name  04/01/20 1036         6 Minute Walk   Phase  Initial     Distance  1702 feet     Walk Time  6 minutes     # of Rest Breaks  0     MPH  3.2     METS  4     RPE  7     Perceived Dyspnea   0     VO2 Peak  14.03     Symptoms  No     Resting HR  91 bpm     Resting BP  104/72     Resting Oxygen Saturation   100 %     Exercise Oxygen Saturation  during 6 min walk  100 %     Max Ex. HR  102 bpm     Max Ex. BP  120/60     2 Minute Post BP  110/70        Oxygen Initial Assessment:   Oxygen Re-Evaluation:   Oxygen Discharge (Final Oxygen Re-Evaluation):   Initial Exercise Prescription: Initial Exercise Prescription - 04/01/20 1000      Date of Initial Exercise RX and Referring Provider   Date  04/01/20    Referring Provider  Nigel Mormon    Expected Discharge Date  06/04/20      Treadmill   MPH  2.3    Grade  1    Minutes  15    METs  3.08      NuStep   Level  2    SPM  85    Minutes  15    METs  3      Prescription Details   Frequency (times per week)  3x    Duration  Progress to 30 minutes of continuous aerobic without signs/symptoms of physical distress      Intensity   THRR 40-80% of Max Heartrate  63-126    Ratings of Perceived Exertion  11-13    Perceived Dyspnea  0-4      Progression   Progression  Continue progressive overload as per policy without signs/symptoms or physical distress.      Resistance Training   Training Prescription  Yes    Weight  4lbs    Reps  10-15       Perform Capillary Blood Glucose checks as needed.  Exercise Prescription Changes:  Exercise Prescription Changes    Row Name 04/12/20 0708             Response to Exercise   Blood Pressure (Admit)  102/62       Blood Pressure (Exercise)  124/76       Blood Pressure (Exit)  110/62       Heart  Rate (Admit)  96 bpm       Heart Rate (Exercise)  106 bpm       Heart Rate (Exit)  94 bpm       Rating of Perceived Exertion (Exercise)  7       Symptoms  none        Comments  Off to a good start with exercise.       Duration  Continue with 30 min of aerobic exercise without signs/symptoms of physical distress.       Intensity  THRR unchanged         Progression   Progression  Continue to progress workloads to maintain intensity without signs/symptoms of physical distress.       Average METs  3.6         Resistance Training   Training Prescription  Yes       Weight  4lbs       Reps  10-15       Time  10 Minutes         Interval Training   Interval Training  No         Treadmill   MPH  2.8       Grade  2       Minutes  15       METs  3.92         NuStep   Level  2       SPM  85       Minutes  15       METs  3.3          Exercise Comments:  Exercise Comments    Row Name 04/12/20 0813           Exercise Comments  Patient tolerated first session of exercise well without symptoms.          Exercise Goals and Review:  Exercise Goals    Row Name 04/01/20 1038             Exercise Goals   Increase Physical Activity  Yes       Intervention  Provide advice, education, support and counseling about physical activity/exercise needs.;Develop an individualized exercise prescription for aerobic and resistive training based on initial evaluation findings, risk stratification, comorbidities and participant's personal goals.       Expected Outcomes  Short Term: Attend rehab on a regular basis to increase amount of physical activity.;Long Term: Exercising regularly at least 3-5 days a week.;Long Term: Add in home exercise to make exercise part of routine and to increase amount of physical activity.       Increase Strength and Stamina  Yes       Intervention  Provide advice, education, support and counseling about physical activity/exercise needs.;Develop an individualized exercise prescription for aerobic and resistive training based on initial evaluation findings, risk stratification, comorbidities and participant's personal goals.        Expected Outcomes  Short Term: Increase workloads from initial exercise prescription for resistance, speed, and METs.;Short Term: Perform resistance training exercises routinely during rehab and add in resistance training at home;Long Term: Improve cardiorespiratory fitness, muscular endurance and strength as measured by increased METs and functional capacity (6MWT)       Able to understand and use rate of perceived exertion (RPE) scale  Yes       Intervention  Provide education and explanation on how to use RPE scale       Expected  Outcomes  Short Term: Able to use RPE daily in rehab to express subjective intensity level;Long Term:  Able to use RPE to guide intensity level when exercising independently       Knowledge and understanding of Target Heart Rate Range (THRR)  Yes       Intervention  Provide education and explanation of THRR including how the numbers were predicted and where they are located for reference       Expected Outcomes  Short Term: Able to state/look up THRR;Long Term: Able to use THRR to govern intensity when exercising independently;Short Term: Able to use daily as guideline for intensity in rehab       Able to check pulse independently  Yes       Intervention  Provide education and demonstration on how to check pulse in carotid and radial arteries.;Review the importance of being able to check your own pulse for safety during independent exercise       Expected Outcomes  Short Term: Able to explain why pulse checking is important during independent exercise;Long Term: Able to check pulse independently and accurately       Understanding of Exercise Prescription  Yes       Intervention  Provide education, explanation, and written materials on patient's individual exercise prescription       Expected Outcomes  Short Term: Able to explain program exercise prescription;Long Term: Able to explain home exercise prescription to exercise independently          Exercise Goals  Re-Evaluation : Exercise Goals Re-Evaluation    Row Name 04/12/20 0813             Exercise Goal Re-Evaluation   Exercise Goals Review  Increase Physical Activity;Able to understand and use rate of perceived exertion (RPE) scale       Comments  Patient able to understand and use RPE scale appropriately.       Expected Outcomes  Increase workloads as tolerated to help increase strength and stamina.          Discharge Exercise Prescription (Final Exercise Prescription Changes): Exercise Prescription Changes - 04/12/20 0708      Response to Exercise   Blood Pressure (Admit)  102/62    Blood Pressure (Exercise)  124/76    Blood Pressure (Exit)  110/62    Heart Rate (Admit)  96 bpm    Heart Rate (Exercise)  106 bpm    Heart Rate (Exit)  94 bpm    Rating of Perceived Exertion (Exercise)  7    Symptoms  none    Comments  Off to a good start with exercise.    Duration  Continue with 30 min of aerobic exercise without signs/symptoms of physical distress.    Intensity  THRR unchanged      Progression   Progression  Continue to progress workloads to maintain intensity without signs/symptoms of physical distress.    Average METs  3.6      Resistance Training   Training Prescription  Yes    Weight  4lbs    Reps  10-15    Time  10 Minutes      Interval Training   Interval Training  No      Treadmill   MPH  2.8    Grade  2    Minutes  15    METs  3.92      NuStep   Level  2    SPM  85    Minutes  15    METs  3.3       Nutrition:  Target Goals: Understanding of nutrition guidelines, daily intake of sodium 1500mg , cholesterol 200mg , calories 30% from fat and 7% or less from saturated fats, daily to have 5 or more servings of fruits and vegetables.  Biometrics: Pre Biometrics - 04/01/20 1038      Pre Biometrics   Height  5' 9.75" (1.772 m)    Weight  86.8 kg    Waist Circumference  40 inches    Hip Circumference  42 inches    Waist to Hip Ratio  0.95 %    BMI  (Calculated)  27.64    Triceps Skinfold  12 mm    % Body Fat  37 %    Grip Strength  42 kg    Flexibility  12.5 in    Single Leg Stand  94 seconds        Nutrition Therapy Plan and Nutrition Goals:   Nutrition Assessments:   Nutrition Goals Re-Evaluation:   Nutrition Goals Re-Evaluation:   Nutrition Goals Discharge (Final Nutrition Goals Re-Evaluation):   Psychosocial: Target Goals: Acknowledge presence or absence of significant depression and/or stress, maximize coping skills, provide positive support system. Participant is able to verbalize types and ability to use techniques and skills needed for reducing stress and depression.  Initial Review & Psychosocial Screening: Initial Psych Review & Screening - 04/01/20 1138      Initial Review   Current issues with  None Identified      Family Dynamics   Good Support System?  Yes   Jamion has his wife for support     Barriers   Psychosocial barriers to participate in program  There are no identifiable barriers or psychosocial needs.      Screening Interventions   Interventions  Encouraged to exercise       Quality of Life Scores: Quality of Life - 04/01/20 1039      Quality of Life   Select  Quality of Life      Quality of Life Scores   Health/Function Pre  22.63 %    Socioeconomic Pre  25.21 %    Psych/Spiritual Pre  25.21 %    Family Pre  22.6 %    GLOBAL Pre  23.79 %      Scores of 19 and below usually indicate a poorer quality of life in these areas.  A difference of  2-3 points is a clinically meaningful difference.  A difference of 2-3 points in the total score of the Quality of Life Index has been associated with significant improvement in overall quality of life, self-image, physical symptoms, and general health in studies assessing change in quality of life.  PHQ-9: Recent Review Flowsheet Data    Depression screen [facility] 2/9 04/01/2020   Decreased Interest 0   Down, Depressed, Hopeless 0   PHQ - 2  Score 0     Interpretation of Total Score  Total Score Depression Severity:  1-4 = Minimal depression, 5-9 = Mild depression, 10-14 = Moderate depression, 15-19 = Moderately severe depression, 20-27 = Severe depression   Psychosocial Evaluation and Intervention: Psychosocial Evaluation - 04/12/20 1130      Psychosocial Evaluation & Interventions   Interventions  Encouraged to exercise with the program and follow exercise prescription    Comments  Mr. Julian denies psychosocial barriers to participation in CR and self health management. He has a positive outlook and attitude and eager to return  to work for [employer]. He enjoys playing tennis and golf and doing "handyman tasks" around the home. He has a strong support system. No psychosocial interventions needed.    Expected Outcomes  Patient will continue to maintain a positive attitude and outlook. He will utilize his support system when needed. He will continue to enjoy his hobbies for stress reduction.    Continue Psychosocial Services   No Follow up required       Psychosocial Re-Evaluation:   Psychosocial Discharge (Final Psychosocial Re-Evaluation):   Vocational Rehabilitation: Provide vocational rehab assistance to qualifying candidates.   Vocational Rehab Evaluation & Intervention: Vocational Rehab - 04/01/20 1139      Initial Vocational Rehab Evaluation & Intervention   Assessment shows need for Vocational Rehabilitation  No       Education: Education Goals: Education classes will be provided on a weekly basis, covering required topics. Participant will state understanding/return demonstration of topics presented.  Learning Barriers/Preferences: Learning Barriers/Preferences - 04/01/20 1040      Learning Barriers/Preferences   Learning Barriers  Sight    Learning Preferences  Written Material;Skilled Demonstration;Individual Instruction       Education Topics: Count Your Pulse:  -Group instruction provided by  verbal instruction, demonstration, patient participation and written materials to support subject.  Instructors address importance of being able to find your pulse and how to count your pulse when at home without a heart monitor.  Patients get hands on experience counting their pulse with staff help and individually.   Heart Attack, Angina, and Risk Factor Modification:  -Group instruction provided by verbal instruction, video, and written materials to support subject.  Instructors address signs and symptoms of angina and heart attacks.    Also discuss risk factors for heart disease and how to make changes to improve heart health risk factors.   Functional Fitness:  -Group instruction provided by verbal instruction, demonstration, patient participation, and written materials to support subject.  Instructors address safety measures for doing things around the house.  Discuss how to get up and down off the floor, how to pick things up properly, how to safely get out of a chair without assistance, and balance training.   Meditation and Mindfulness:  -Group instruction provided by verbal instruction, patient participation, and written materials to support subject.  Instructor addresses importance of mindfulness and meditation practice to help reduce stress and improve awareness.  Instructor also leads participants through a meditation exercise.    Stretching for Flexibility and Mobility:  -Group instruction provided by verbal instruction, patient participation, and written materials to support subject.  Instructors lead participants through series of stretches that are designed to increase flexibility thus improving mobility.  These stretches are additional exercise for major muscle groups that are typically performed during regular warm up and cool down.   Hands Only CPR:  -Group verbal, video, and participation provides a basic overview of AHA guidelines for community CPR. Role-play of emergencies  allow participants the opportunity to practice calling for help and chest compression technique with discussion of AED use.   Hypertension: -Group verbal and written instruction that provides a basic overview of hypertension including the most recent diagnostic guidelines, risk factor reduction with self-care instructions and medication management.    Nutrition I class: Heart Healthy Eating:  -Group instruction provided by PowerPoint slides, verbal discussion, and written materials to support subject matter. The instructor gives an explanation and review of the Therapeutic Lifestyle Changes diet recommendations, which includes a discussion on lipid goals, dietary  fat, sodium, fiber, plant stanol/sterol esters, sugar, and the components of a well-balanced, healthy diet.   Nutrition II class: Lifestyle Skills:  -Group instruction provided by PowerPoint slides, verbal discussion, and written materials to support subject matter. The instructor gives an explanation and review of label reading, grocery shopping for heart health, heart healthy recipe modifications, and ways to make healthier choices when eating out.   Diabetes Question & Answer:  -Group instruction provided by PowerPoint slides, verbal discussion, and written materials to support subject matter. The instructor gives an explanation and review of diabetes co-morbidities, pre- and post-prandial blood glucose goals, pre-exercise blood glucose goals, signs, symptoms, and treatment of hypoglycemia and hyperglycemia, and foot care basics.   Diabetes Blitz:  -Group instruction provided by PowerPoint slides, verbal discussion, and written materials to support subject matter. The instructor gives an explanation and review of the physiology behind type 1 and type 2 diabetes, diabetes medications and rational behind using different medications, pre- and post-prandial blood glucose recommendations and Hemoglobin A1c goals, diabetes diet, and  exercise including blood glucose guidelines for exercising safely.    Portion Distortion:  -Group instruction provided by PowerPoint slides, verbal discussion, written materials, and food models to support subject matter. The instructor gives an explanation of serving size versus portion size, changes in portions sizes over the last 20 years, and what consists of a serving from each food group.   Stress Management:  -Group instruction provided by verbal instruction, video, and written materials to support subject matter.  Instructors review role of stress in heart disease and how to cope with stress positively.     Exercising on Your Own:  -Group instruction provided by verbal instruction, power point, and written materials to support subject.  Instructors discuss benefits of exercise, components of exercise, frequency and intensity of exercise, and end points for exercise.  Also discuss use of nitroglycerin and activating EMS.  Review options of places to exercise outside of rehab.  Review guidelines for sex with heart disease.   Cardiac Drugs I:  -Group instruction provided by verbal instruction and written materials to support subject.  Instructor reviews cardiac drug classes: antiplatelets, anticoagulants, beta blockers, and statins.  Instructor discusses reasons, side effects, and lifestyle considerations for each drug class.   Cardiac Drugs II:  -Group instruction provided by verbal instruction and written materials to support subject.  Instructor reviews cardiac drug classes: angiotensin converting enzyme inhibitors (ACE-I), angiotensin II receptor blockers (ARBs), nitrates, and calcium channel blockers.  Instructor discusses reasons, side effects, and lifestyle considerations for each drug class.   Anatomy and Physiology of the Circulatory System:  Group verbal and written instruction and models provide basic cardiac anatomy and physiology, with the coronary electrical and arterial  systems. Review of: AMI, Angina, Valve disease, Heart Failure, Peripheral Artery Disease, Cardiac Arrhythmia, Pacemakers, and the ICD.   Other Education:  -Group or individual verbal, written, or video instructions that support the educational goals of the cardiac rehab program.   Holiday Eating Survival Tips:  -Group instruction provided by PowerPoint slides, verbal discussion, and written materials to support subject matter. The instructor gives patients tips, tricks, and techniques to help them not only survive but enjoy the holidays despite the onslaught of food that accompanies the holidays.   Knowledge Questionnaire Score: Knowledge Questionnaire Score - 04/01/20 1040      Knowledge Questionnaire Score   Pre Score  21/24       Core Components/Risk Factors/Patient Goals at Admission: Personal Goals and Risk  Factors at Admission - 04/01/20 1139      Core Components/Risk Factors/Patient Goals on Admission    Weight Management  Yes;Weight Maintenance    Intervention  Weight Management: Develop a combined nutrition and exercise program designed to reach desired caloric intake, while maintaining appropriate intake of nutrient and fiber, sodium and fats, and appropriate energy expenditure required for the weight goal.;Weight Management: Provide education and appropriate resources to help participant work on and attain dietary goals.    Expected Outcomes  Weight Maintenance: Understanding of the daily nutrition guidelines, which includes 25-35% calories from fat, 7% or less cal from saturated fats, less than 200mg  cholesterol, less than 1.5gm of sodium, & 5 or more servings of fruits and vegetables daily;Understanding recommendations for meals to include 15-35% energy as protein, 25-35% energy from fat, 35-60% energy from carbohydrates, less than 200mg  of dietary cholesterol, 20-35 gm of total fiber daily;Understanding of distribution of calorie intake throughout the day with the consumption  of 4-5 meals/snacks       Core Components/Risk Factors/Patient Goals Review:  Goals and Risk Factor Review    Row Name 04/12/20 1133             Core Components/Risk Factors/Patient Goals Review   Personal Goals Review  Weight Management/Obesity;Lipids;Hypertension       Review  Mr. Defer has multiple CAD risk factors. He is currently walking 2 hours a day and eager to participate in CR for education and risk factor modification. His goals are to have the stamina and strength to return to jogging for exercise and hopes to be able to continue to play golf and jog for time to come.       Expected Outcomes  Paitent will continue to participate in CR for risk factor modification          Core Components/Risk Factors/Patient Goals at Discharge (Final Review):  Goals and Risk Factor Review - 04/12/20 1133      Core Components/Risk Factors/Patient Goals Review   Personal Goals Review  Weight Management/Obesity;Lipids;Hypertension    Review  Mr. Parmentier has multiple CAD risk factors. He is currently walking 2 hours a day and eager to participate in CR for education and risk factor modification. His goals are to have the stamina and strength to return to jogging for exercise and hopes to be able to continue to play golf and jog for time to come.    Expected Outcomes  Paitent will continue to participate in CR for risk factor modification       ITP Comments: ITP Comments    Row Name 04/01/20 1035 04/01/20 1135 04/12/20 1128       ITP Comments  Dr. Fransico Him, Medical Director  Dr. Fransico Him MD, Medical Director  30 day ITP review: Today, Mr. Poitier completed his first cardiac rehab exercise session and tolerated well. VSS. Denied complaints. Reported RPE of 7 on NuStep and Treadmill at prescribed workloads.        Comments: see ITP comments

## 2020-04-12 NOTE — Progress Notes (Signed)
Daily Session Note  Patient Details  Name: Daniel Browning MRN: [id number] Date of Birth: [birth year] Referring Provider:     CARDIAC REHAB PHASE II ORIENTATION from 04/01/2020 in [place]  Referring Provider  Nigel Mormon      Encounter Date: 04/12/2020  Check In: Session Check In - 04/12/20 0703      Check-In   Supervising physician immediately available to respond to emergencies  [facility] immediately available    Physician(s)  Dr. Sherral Hammers    Location  [facility]    Staff Present  Hoy Register, MS, Exercise Physiologist;Tyara Carol Ada, MS,ACSM CEP, Exercise Physiologist;Zarion Oliff Rollene Rotunda, RN, Deland Pretty, MS, ACSM CEP, Exercise Physiologist    Virtual Visit  No    Medication changes reported      No    Fall or balance concerns reported     No    Tobacco Cessation  No Change    Warm-up and Cool-down  Performed on first and last piece of equipment    Resistance Training Performed  Yes    VAD Patient?  No    PAD/SET Patient?  No      Pain Assessment   Currently in Pain?  No/denies    Pain Score  0-No pain    Multiple Pain Sites  No       Capillary Blood Glucose: No results found for this or any previous visit (from the past 24 hour(s)).  Exercise Prescription Changes - 04/12/20 0708      Response to Exercise   Blood Pressure (Admit)  102/62    Blood Pressure (Exercise)  124/76    Blood Pressure (Exit)  110/62    Heart Rate (Admit)  96 bpm    Heart Rate (Exercise)  106 bpm    Heart Rate (Exit)  94 bpm    Rating of Perceived Exertion (Exercise)  7    Symptoms  none    Comments  Off to a good start with exercise.    Duration  Continue with 30 min of aerobic exercise without signs/symptoms of physical distress.    Intensity  THRR unchanged      Progression   Progression  Continue to progress workloads to maintain intensity without signs/symptoms of physical distress.    Average METs  3.6      Resistance Training   Training Prescription  Yes    Weight  4lbs    Reps  10-15    Time  10 Minutes      Interval Training   Interval Training  No      Treadmill   MPH  2.8    Grade  2    Minutes  15    METs  3.92      NuStep   Level  2    SPM  85    Minutes  15    METs  3.3       Social History   Tobacco Use  Smoking Status Never Smoker  Smokeless Tobacco Never Used    Goals Met:  Exercise tolerated well Personal goals reviewed No report of cardiac concerns or symptoms Strength training completed today  Goals Unmet:  Not Applicable  Comments: Pt started cardiac rehab today.  Pt tolerated light exercise without difficulty. VSS, telemetry-NSR, asymptomatic.  Medication list reconciled. Pt denies barriers to medicaiton compliance.  PSYCHOSOCIAL ASSESSMENT:  PHQ-0. Pt exhibits positive coping skills, hopeful outlook with supportive family. No psychosocial needs identified at  this time, no psychosocial interventions necessary.  Pt oriented to exercise equipment and routine. Understanding verbalized.   Dr. Fransico Him is Medical Director for Cardiac Rehab at [facility].

## 2020-04-14 ENCOUNTER — Other Ambulatory Visit: Payer: Self-pay

## 2020-04-14 ENCOUNTER — Encounter (HOSPITAL_COMMUNITY)
Admission: RE | Admit: 2020-04-14 | Discharge: 2020-04-14 | Disposition: A | Discharge status: Home / Self Care | Payer: BC Managed Care – PPO | Source: Ambulatory Visit | Attending: Cardiology | Admitting: Cardiology

## 2020-04-14 DIAGNOSIS — Z951 Presence of aortocoronary bypass graft: Secondary | ICD-10-CM | POA: Diagnosis not present

## 2020-04-14 DIAGNOSIS — Z9889 Other specified postprocedural states: Secondary | ICD-10-CM | POA: Diagnosis not present

## 2020-04-15 ENCOUNTER — Other Ambulatory Visit: Payer: Self-pay | Admitting: Physician Assistant

## 2020-04-16 ENCOUNTER — Other Ambulatory Visit: Payer: Self-pay

## 2020-04-16 ENCOUNTER — Encounter (HOSPITAL_COMMUNITY)
Admission: RE | Admit: 2020-04-16 | Discharge: 2020-04-16 | Disposition: A | Discharge status: Home / Self Care | Payer: BC Managed Care – PPO | Source: Ambulatory Visit | Attending: Cardiology | Admitting: Cardiology

## 2020-04-16 DIAGNOSIS — Z9889 Other specified postprocedural states: Secondary | ICD-10-CM

## 2020-04-16 DIAGNOSIS — Z951 Presence of aortocoronary bypass graft: Secondary | ICD-10-CM

## 2020-04-16 NOTE — Progress Notes (Signed)
Daniel Browning 64 y.o. male Nutrition Note  Visit Diagnosis: S/P CABG x 1 02/19/20  S/P mitral valve repair, Maze procedure 02/19/20  Past Medical History:  Diagnosis Date  . Atrial fibrillation ([place])   . Colon polyps   . Coronary artery disease   . GERD (gastroesophageal reflux disease)   . Heart murmur   . Mitral valve prolapse   . Nonrheumatic mitral valve regurgitation   . Persistent atrial fibrillation ([place])   . S/P CABG x 1 02/19/2020   LIMA to LAD  . S/P Maze operation for atrial fibrillation 02/19/2020   Complete bilateral atrial lesion set using bipolar radiofrequency ablation and cryothermy with clipping of LA appendage via median sternotomy approach  . S/P mitral valve repair 02/19/2020   Complex valvuloplasty including artificial Gore-tex neochord placement x8, decalcification of posterior leaflet and posterior annulus with sliding leaflet plasty and 34 mm Sorin Memo 4D ring annuloplasty     Medications reviewed.   Current Outpatient Medications:  .  acetaminophen (TYLENOL) 500 MG tablet, Take 2 tablets (1,000 mg total) by mouth every 6 (six) hours., Disp: 30 tablet, Rfl: 0 .  amoxicillin (AMOXIL) 500 MG tablet, Take 1 tablet (500 mg total) by mouth once as needed for up to 1 dose. 1 hour before dental procedure, Disp: 4 tablet, Rfl: 4 .  aspirin EC 81 MG EC tablet, Take 1 tablet (81 mg total) by mouth daily., Disp: , Rfl:  .  metoprolol tartrate (LOPRESSOR) 25 MG tablet, Take 0.5 tablets (12.5 mg total) by mouth 2 (two) times daily., Disp: 30 tablet, Rfl: 1 .  nitroGLYCERIN (NITROSTAT) 0.4 MG SL tablet, Place under the tongue., Disp: , Rfl:  .  omeprazole (PRILOSEC OTC) 20 MG tablet, Take 20 mg by mouth daily., Disp: , Rfl:  .  warfarin (COUMADIN) 5 MG tablet, Take 1 tablet (5 mg total) by mouth daily at 6 PM. (Patient taking differently: Take 5 mg by mouth daily at 6 PM. 7.5 on MWF), Disp: 30 tablet, Rfl: 3   Ht Readings from Last 1 Encounters:  04/01/20 5' 9.75" (1.772  m)     Wt Readings from Last 3 Encounters:  04/01/20 191 lb 5.8 oz (86.8 kg)  03/22/20 189 lb (85.7 kg)  03/19/20 189 lb (85.7 kg)     There is no height or weight on file to calculate BMI.   Social History   Tobacco Use  Smoking Status Never Smoker  Smokeless Tobacco Never Used     No results found for: CHOL No results found for: HDL No results found for: LDLCALC No results found for: TRIG   Lab Results  Component Value Date   HGBA1C 5.2 02/16/2020     CBG (last 3)  No results for input(s): GLUCAP in the last 72 hours.   Nutrition Note  Spoke with pt. Nutrition Plan and Nutrition Survey goals reviewed with pt. Pt is interested in following a Heart Healthy diet.    Per discussion, pt does use canned/convenience foods often. Pt does not add salt to food. Pt does not eat out frequently.  Pt reports decreasing processed meat and red meat. He really enjoys cheese and milk. He does not enjoy vegetables or fruit. He can eat green beans, carrots, and spinach/salads. He is somewhat amenable to whole grains. We discussed ways to incorporate the veggies/whole grains he enjoys.  His wife cooks and grocery shops and reads labels. He will provide handouts to wife. Pt expressed understanding of the information reviewed.  Nutrition Diagnosis ? Inadequate fiber intake related to food preferences and habits as evidenced by estimated daily fiber intake of <10 g.  Nutrition Intervention ? Pt's individual nutrition plan reviewed with pt. ? Benefits of adopting Heart Healthy diet discussed when Medficts reviewed.   ? Continue client-centered nutrition education by RD, as part of interdisciplinary care.  Goal(s) ? Pt to build a healthy plate including vegetables, fruits, whole grains, and low-fat dairy products in a heart healthy meal plan. ? Pt to increase fiber intake by 5 g daily by incorporating vegetables and whole grains for goal of 25 g/day  Plan:   Will provide  client-centered nutrition education as part of interdisciplinary care  Monitor and evaluate progress toward nutrition goal with team.   Michaele Offer, MS, RDN, LDN

## 2020-04-19 ENCOUNTER — Other Ambulatory Visit: Payer: Self-pay

## 2020-04-19 ENCOUNTER — Encounter (HOSPITAL_COMMUNITY)
Admission: RE | Admit: 2020-04-19 | Discharge: 2020-04-19 | Disposition: A | Discharge status: Home / Self Care | Payer: BC Managed Care – PPO | Source: Ambulatory Visit | Attending: Cardiology | Admitting: Cardiology

## 2020-04-19 DIAGNOSIS — Z951 Presence of aortocoronary bypass graft: Secondary | ICD-10-CM

## 2020-04-19 DIAGNOSIS — Z9889 Other specified postprocedural states: Secondary | ICD-10-CM | POA: Diagnosis not present

## 2020-04-21 ENCOUNTER — Other Ambulatory Visit: Payer: Self-pay

## 2020-04-21 ENCOUNTER — Encounter (HOSPITAL_COMMUNITY)
Admission: RE | Admit: 2020-04-21 | Discharge: 2020-04-21 | Disposition: A | Discharge status: Home / Self Care | Payer: BC Managed Care – PPO | Source: Ambulatory Visit | Attending: Cardiology | Admitting: Cardiology

## 2020-04-21 DIAGNOSIS — Z951 Presence of aortocoronary bypass graft: Secondary | ICD-10-CM | POA: Diagnosis not present

## 2020-04-21 DIAGNOSIS — Z9889 Other specified postprocedural states: Secondary | ICD-10-CM | POA: Diagnosis not present

## 2020-04-23 ENCOUNTER — Other Ambulatory Visit: Payer: Self-pay

## 2020-04-23 ENCOUNTER — Encounter (HOSPITAL_COMMUNITY)
Admission: RE | Admit: 2020-04-23 | Discharge: 2020-04-23 | Disposition: A | Discharge status: Home / Self Care | Payer: BC Managed Care – PPO | Source: Ambulatory Visit | Attending: Cardiology | Admitting: Cardiology

## 2020-04-23 ENCOUNTER — Ambulatory Visit: Payer: BC Managed Care – PPO | Admitting: Pharmacist

## 2020-04-23 DIAGNOSIS — Z951 Presence of aortocoronary bypass graft: Secondary | ICD-10-CM | POA: Diagnosis not present

## 2020-04-23 DIAGNOSIS — I4891 Unspecified atrial fibrillation: Secondary | ICD-10-CM

## 2020-04-23 DIAGNOSIS — Z7901 Long term (current) use of anticoagulants: Secondary | ICD-10-CM | POA: Diagnosis not present

## 2020-04-23 DIAGNOSIS — Z9889 Other specified postprocedural states: Secondary | ICD-10-CM | POA: Diagnosis not present

## 2020-04-23 DIAGNOSIS — Z5181 Encounter for therapeutic drug level monitoring: Secondary | ICD-10-CM | POA: Diagnosis not present

## 2020-04-23 LAB — POCT INR: INR: 1.7 — AB (ref 2.0–3.0)

## 2020-04-23 MED ORDER — METOPROLOL TARTRATE 25 MG PO TABS: 12.5000 mg | ORAL_TABLET | Freq: Two times a day (BID) | ORAL | 1 refills | Status: DC

## 2020-04-23 NOTE — Patient Instructions (Signed)
INR below goal. Increase dose to 5 mg W and 7.5 mg all other days. Recheck INR in 2 week w/ OV.

## 2020-04-23 NOTE — Progress Notes (Signed)
Anticoagulation Management Daniel Browning is a 64 y.o. male who reports to the clinic for monitoring of warfarin treatment.    Indication: Paroxysmal AFib s/p Maze procedure CHA2DS2 Vasc Score 1, HAS-BLED 0  Duration: 3 months (till June 2021) Supervising physician: [facility] Visit History:  Patient does not report signs/symptoms of bleeding or thromboembolism   Other recent changes: No changes in diet, medications, lifestyle. Pt started cardiac rehab since last visit. Reports to be attending 3x/week. Improved exercise tolerance and reports to be doing really well with the program. Denies any complains of SOB, dyspnea upon exertion. Able to do a lot more around the house.   Anticoagulation Episode Summary    Current INR goal:  2.0-3.0  TTR:  21.6 % (1.6 mo)  Next INR check:  04/22/2020  INR from last check:  1.7 (04/23/2020)  Weekly max warfarin dose:  5 mg  Target end date:    INR check location:    Preferred lab:    Send INR reminders to:     Indications   Atrial fibrillation ([facility]) [I48.91]       Comments:          No Known Allergies  Current Outpatient Medications:  .  acetaminophen (TYLENOL) 500 MG tablet, Take 2 tablets (1,000 mg total) by mouth every 6 (six) hours., Disp: 30 tablet, Rfl: 0 .  amoxicillin (AMOXIL) 500 MG tablet, Take 1 tablet (500 mg total) by mouth once as needed for up to 1 dose. 1 hour before dental procedure, Disp: 4 tablet, Rfl: 4 .  aspirin EC 81 MG EC tablet, Take 1 tablet (81 mg total) by mouth daily., Disp: , Rfl:  .  metoprolol tartrate (LOPRESSOR) 25 MG tablet, Take 0.5 tablets (12.5 mg total) by mouth 2 (two) times daily., Disp: 30 tablet, Rfl: 1 .  nitroGLYCERIN (NITROSTAT) 0.4 MG SL tablet, Place under the tongue., Disp: , Rfl:  .  omeprazole (PRILOSEC OTC) 20 MG tablet, Take 20 mg by mouth daily., Disp: , Rfl:  .  warfarin (COUMADIN) 5 MG tablet, Take 1 tablet (5 mg total) by mouth daily at 6 PM. (Patient  taking differently: Take 5 mg by mouth daily at 6 PM. 7.5 on MWF), Disp: 30 tablet, Rfl: 3 Past Medical History:  Diagnosis Date  . Atrial fibrillation ([place])   . Colon polyps   . Coronary artery disease   . GERD (gastroesophageal reflux disease)   . Heart murmur   . Mitral valve prolapse   . Nonrheumatic mitral valve regurgitation   . Persistent atrial fibrillation ([place])   . S/P CABG x 1 02/19/2020   LIMA to LAD  . S/P Maze operation for atrial fibrillation 02/19/2020   Complete bilateral atrial lesion set using bipolar radiofrequency ablation and cryothermy with clipping of LA appendage via median sternotomy approach  . S/P mitral valve repair 02/19/2020   Complex valvuloplasty including artificial Gore-tex neochord placement x8, decalcification of posterior leaflet and posterior annulus with sliding leaflet plasty and 34 mm Sorin Memo 4D ring annuloplasty    ASSESSMENT  Recent Results: The most recent result is correlated with 45 mg per week:  Lab Results  Component Value Date   INR 1.7 (A) 04/23/2020   INR 1.9 (A) 04/08/2020   INR 2.1 03/25/2020    Anticoagulation Dosing: Description   INR below goal. Continue with 5 mg M,W, and F and 7.5 mg all other days. Recheck INR in 2 week  INR today: Subtherapeutic. INR continues to trend down despite dose increase of 5.9% last visit. Pt denies any missed doses, changes in his medications, increased Vit-K intake. Denies any identifiable causes for the INR decrease. Warrents further warfarin weekly dose increase. Three months anticoag evaluation scheduled  w/ Dr. Virgina Jock on 05/06/20 to determine if pt would need to continue on warfarin therapy s/p valve repair procedure.  PLAN Weekly dose was increased by 5.9%. Increase dose to 5 mg every Mon, Wed, Fri and 7.5 mg all other days. Recheck INR in 2 weeks.  There are no Patient Instructions on file for this visit. Patient advised to contact clinic or seek medical attention if  signs/symptoms of bleeding or thromboembolism occur.  Patient verbalized understanding by repeating back information and was advised to contact me if further medication-related questions arise.   Follow-up No follow-ups on file.  Alysia Penna, PharmD  15 minutes spent face-to-face with the patient during the encounter. 50% of time spent on education, including signs/sx bleeding and clotting, as well as food and drug interactions with warfarin. 50% of time was spent on fingerprick POC INR sample collection,processing, results determination, and documentation

## 2020-04-26 ENCOUNTER — Encounter (HOSPITAL_COMMUNITY)
Admission: RE | Admit: 2020-04-26 | Discharge: 2020-04-26 | Disposition: A | Discharge status: Home / Self Care | Payer: BC Managed Care – PPO | Source: Ambulatory Visit | Attending: Cardiology | Admitting: Cardiology

## 2020-04-26 ENCOUNTER — Ambulatory Visit (INDEPENDENT_AMBULATORY_CARE_PROVIDER_SITE_OTHER): Payer: Self-pay | Admitting: Thoracic Surgery (Cardiothoracic Vascular Surgery)

## 2020-04-26 ENCOUNTER — Encounter: Payer: Self-pay | Admitting: Thoracic Surgery (Cardiothoracic Vascular Surgery)

## 2020-04-26 ENCOUNTER — Other Ambulatory Visit: Payer: Self-pay

## 2020-04-26 VITALS — BP 129/83 | HR 57 | Temp 97.0°F | Resp 20 | Ht 70.0 in | Wt 191.6 lb

## 2020-04-26 DIAGNOSIS — Z9889 Other specified postprocedural states: Secondary | ICD-10-CM | POA: Diagnosis not present

## 2020-04-26 DIAGNOSIS — Z951 Presence of aortocoronary bypass graft: Secondary | ICD-10-CM

## 2020-04-26 DIAGNOSIS — Z8679 Personal history of other diseases of the circulatory system: Secondary | ICD-10-CM

## 2020-04-26 NOTE — Progress Notes (Signed)
[address]       [place] [address]             [phone]     CARDIOTHORACIC SURGERY OFFICE NOTE  Referring Provider is Nigel Mormon, MD PCP is [name], MD   HPI:  Patient is a 64 year old male with history of mitral valve prolapse, coronary artery disease, persistent atrial fibrillation, and GE reflux disease who returns to the office today for routine follow-up status post mitral valve repair, coronary artery bypass grafting, and Maze procedure on February 19, 2020.  His early postoperative recovery was uneventful and he was last seen here in our office on March 22, 2020 at which time he was doing well.  He has been followed carefully by Dr. Virgina Jock and routine follow-up echocardiogram performed recently in his office revealed intact mitral valve repair with no residual mitral regurgitation.  Left ventricular ejection fraction had dropped from 55% prior to surgery to 40%.  Mean transvalvular gradient across the mitral valve was estimated 5 mmHg at 94 bpm.  Patient returns to our office today and reports that he is doing very well.  He has been maintaining sinus rhythm.  He remains anticoagulated using warfarin.  He has been participating in the cardiac rehab program and making excellent progress.  He states that overall he feels remarkably well.  He states that he no longer has any exertional burning epigastric chest pain that he had prior to surgery, which she had previously been attributing to reflux.  He states that he only gets short of breath going up a flight of stairs.  His exercise tolerance is not back to normal but he is doing remarkably well overall.  He has very minimal residual soreness at the upper and lower end of his sternotomy incision.  He has not had any palpitations or other symptoms to suggest a recurrence of atrial fibrillation.  Overall he is delighted with his progress.   Current Outpatient Medications  Medication Sig Dispense  Refill  . acetaminophen (TYLENOL) 500 MG tablet Take 2 tablets (1,000 mg total) by mouth every 6 (six) hours. 30 tablet 0  . amoxicillin (AMOXIL) 500 MG tablet Take 1 tablet (500 mg total) by mouth once as needed for up to 1 dose. 1 hour before dental procedure (Patient taking differently: Take 2,000 mg by mouth once as needed. 1 hour before dental procedure) 4 tablet 4  . aspirin EC 81 MG EC tablet Take 1 tablet (81 mg total) by mouth daily.    . metoprolol tartrate (LOPRESSOR) 25 MG tablet Take 0.5 tablets (12.5 mg total) by mouth 2 (two) times daily. 30 tablet 1  . omeprazole (PRILOSEC OTC) 20 MG tablet Take 20 mg by mouth daily.    Marland Kitchen warfarin (COUMADIN) 5 MG tablet Take 1 tablet (5 mg total) by mouth daily at 6 PM. (Patient taking differently: Take 5 mg by mouth daily at 6 PM. 7.5 on MWF) 30 tablet 3  . nitroGLYCERIN (NITROSTAT) 0.4 MG SL tablet Place under the tongue.     No current facility-administered medications for this visit.      Physical Exam:   BP 129/83 (BP Location: Right Arm)   Pulse (!) 57   Temp (!) 97 F (36.1 C) (Temporal)   Resp 20   Ht 5\' 10"  (1.778 m)   Wt 191 lb 9.6 oz (86.9 kg)   SpO2 99% Comment: RA  BMI 27.49 kg/m   General:  Well-appearing  Chest:   Clear to auscultation  CV:   Regular rate and rhythm without murmur  Incisions:  Well-healed, sternum is stable  Abdomen:  Soft nontender  Extremities:  Warm and well-perfused  Diagnostic Tests:  2 channel telemetry rhythm strip demonstrates normal sinus rhythm    Echocardiogram 03/17/2020:  Left ventricle cavity is normal in size. Mild concentric hypertrophy of  the left ventricle. Abnormal septal wall motion due to post-operative  valve. Moderately depressed LV systolic function with EF 40%.  Indeterminate diastolic function.  Left atrial cavity is mildly dilated.  S/p mitral annuloplasty. Mean PG 5 mmHg at 94 bpm, which is likely normal.  No mitral valve regurgitation.  IVC is dilated with a  respiratory response of >50%. Estimated RA pressure  8 mmHg.  Compared to previous study on 01/14/2020, mitral regurgitation is now  absent. LVEF is reduced from 55% to 40%.    Impression:  Patient is doing very well and maintaining sinus rhythm approximately 2 months status post mitral valve repair, coronary artery bypass grafting, and Maze procedure  Plan:  We have not recommended any change the patient's current medications.  I have encouraged the patient to continue to increase his physical activity as tolerated with his only limitation remaining that he refrain from heavy lifting or strenuous use of his arms or shoulders for approximately 1 more month.  I think he can go back to work as long as he can avoid heavy lifting and we will release him to unrestricted activity in approximately 1 month.  I have encouraged the patient to complete the cardiac rehab program.  The patient has been reminded regarding the importance of dental hygiene and the lifelong need for antibiotic prophylaxis for all dental cleanings and other related invasive procedures.  The patient will continue to follow-up regularly with Dr. Virgina Jock and return to our office for routine follow-up next March, approximately 1 year from his surgery.  He will call and return sooner should specific problems or questions arise.  All questions answered.    Valentina Gu. Roxy Manns, MD 04/26/2020 2:50 PM

## 2020-04-26 NOTE — Patient Instructions (Signed)
Continue all previous medications without any changes at this time  Continue to avoid any heavy lifting or strenuous use of your arms or shoulders for at least a total of three months from the time of surgery (one more month).  After three months you may gradually increase how much you lift or otherwise use your arms or chest as tolerated, with limits based upon whether or not activities lead to the return of significant discomfort.  Endocarditis is a potentially serious infection of heart valves or inside lining of the heart.  It occurs more commonly in patients with diseased heart valves (such as patient's with aortic or mitral valve disease) and in patients who have undergone heart valve repair or replacement.  Certain surgical and dental procedures may put you at risk, such as dental cleaning, other dental procedures, or any surgery involving the respiratory, urinary, gastrointestinal tract, gallbladder or prostate gland.   To minimize your chances for develooping endocarditis, maintain good oral health and seek prompt medical attention for any infections involving the mouth, teeth, gums, skin or urinary tract.    Always notify your doctor or dentist about your underlying heart valve condition before having any invasive procedures. You will need to take antibiotics before certain procedures, including all routine dental cleanings or other dental procedures.  Your cardiologist or dentist should prescribe these antibiotics for you to be taken ahead of time.

## 2020-04-28 ENCOUNTER — Encounter (HOSPITAL_COMMUNITY)
Admission: RE | Admit: 2020-04-28 | Discharge: 2020-04-28 | Disposition: A | Discharge status: Home / Self Care | Payer: BC Managed Care – PPO | Source: Ambulatory Visit | Attending: Cardiology | Admitting: Cardiology

## 2020-04-28 ENCOUNTER — Other Ambulatory Visit: Payer: Self-pay

## 2020-04-28 DIAGNOSIS — Z9889 Other specified postprocedural states: Secondary | ICD-10-CM | POA: Diagnosis not present

## 2020-04-28 DIAGNOSIS — Z951 Presence of aortocoronary bypass graft: Secondary | ICD-10-CM | POA: Diagnosis not present

## 2020-04-28 NOTE — Progress Notes (Signed)
Reviewed home exercise guidelines with patient including endpoints, temperature precautions, target heart rate and rate of perceived exertion. Pt is walking 60 minutes on the days he doesn't attend cardiac rehab and at 30 minutes on the days he does attend cardiac rehab as his mode of home exercise. Patient has 2,5, and 8 lb weights at home that he can use for his resistance training. Patient not currently doing weights at home. Instructed patient on manual pulse counting, handout given. Pt voices understanding of instructions given.  Sol Passer, MS, ACSM CEP

## 2020-04-30 ENCOUNTER — Encounter (HOSPITAL_COMMUNITY)
Admission: RE | Admit: 2020-04-30 | Discharge: 2020-04-30 | Disposition: A | Discharge status: Home / Self Care | Payer: BC Managed Care – PPO | Source: Ambulatory Visit | Attending: Cardiology | Admitting: Cardiology

## 2020-04-30 ENCOUNTER — Other Ambulatory Visit: Payer: Self-pay

## 2020-04-30 DIAGNOSIS — Z9889 Other specified postprocedural states: Secondary | ICD-10-CM

## 2020-04-30 DIAGNOSIS — Z951 Presence of aortocoronary bypass graft: Secondary | ICD-10-CM | POA: Diagnosis not present

## 2020-05-03 ENCOUNTER — Encounter (HOSPITAL_COMMUNITY)
Admission: RE | Admit: 2020-05-03 | Discharge: 2020-05-03 | Disposition: A | Discharge status: Home / Self Care | Payer: BC Managed Care – PPO | Source: Ambulatory Visit | Attending: Cardiology | Admitting: Cardiology

## 2020-05-03 ENCOUNTER — Other Ambulatory Visit: Payer: Self-pay

## 2020-05-03 DIAGNOSIS — Z951 Presence of aortocoronary bypass graft: Secondary | ICD-10-CM

## 2020-05-03 DIAGNOSIS — Z9889 Other specified postprocedural states: Secondary | ICD-10-CM | POA: Diagnosis not present

## 2020-05-05 ENCOUNTER — Encounter (HOSPITAL_COMMUNITY)
Admission: RE | Admit: 2020-05-05 | Discharge: 2020-05-05 | Disposition: A | Discharge status: Home / Self Care | Payer: BC Managed Care – PPO | Source: Ambulatory Visit | Attending: Cardiology | Admitting: Cardiology

## 2020-05-05 ENCOUNTER — Other Ambulatory Visit: Payer: Self-pay

## 2020-05-05 DIAGNOSIS — Z951 Presence of aortocoronary bypass graft: Secondary | ICD-10-CM | POA: Diagnosis not present

## 2020-05-05 DIAGNOSIS — Z9889 Other specified postprocedural states: Secondary | ICD-10-CM | POA: Diagnosis not present

## 2020-05-05 NOTE — Progress Notes (Signed)
Patient referred by [name], MD for exertional chest pain  Subjective:   Daniel Browning, male    DOB: 06-Jul-1956, 64 y.o.   MRN: [id number]   Chief Complaint  Patient presents with  . Shortness of Breath  . Atrial Fibrillation  . Follow-up    2 week  . Coagulation Disorder    HPI  64 y.o.Caucasianmalewith parxysmal Afib, severe MR, CAD (LAD CTO), now s/p complex mitral valvuloplasty, CABGX1 (LIMA-LAD), Maze procedure.  He is doing every well. He denies chest pain, shortness of breath, palpitations, leg edema, orthopnea, PND, TIA/syncope.   Current Outpatient Medications on File Prior to Visit  Medication Sig Dispense Refill  . acetaminophen (TYLENOL) 500 MG tablet Take 2 tablets (1,000 mg total) by mouth every 6 (six) hours. 30 tablet 0  . amoxicillin (AMOXIL) 500 MG tablet Take 1 tablet (500 mg total) by mouth once as needed for up to 1 dose. 1 hour before dental procedure (Patient taking differently: Take 2,000 mg by mouth once as needed. 1 hour before dental procedure) 4 tablet 4  . aspirin EC 81 MG EC tablet Take 1 tablet (81 mg total) by mouth daily.    . metoprolol tartrate (LOPRESSOR) 25 MG tablet Take 0.5 tablets (12.5 mg total) by mouth 2 (two) times daily. 30 tablet 1  . nitroGLYCERIN (NITROSTAT) 0.4 MG SL tablet Place under the tongue.    Marland Kitchen omeprazole (PRILOSEC OTC) 20 MG tablet Take 20 mg by mouth daily.    Marland Kitchen warfarin (COUMADIN) 5 MG tablet Take 1 tablet (5 mg total) by mouth daily at 6 PM. (Patient taking differently: Take 7.5 mg by mouth daily at 6 PM. 5 mg on Wednesdays) 30 tablet 3   No current facility-administered medications on file prior to visit.    Cardiovascular and other pertinent studies:   EKG 03/18/2020: Sinus rhythm 99 bpm. First degree AV block. Poor R wave progression. Occasional ectopic ventricular beat     Echocardiogram 03/17/2020: Left ventricle cavity is normal in size. Mild concentric hypertrophy of the left ventricle.  Abnormal septal wall motion due to post-operative valve. Moderately depressed LV systolic function with EF 40%. Indeterminate diastolic function. Left atrial cavity is mildly dilated. S/p mitral annuloplasty. Mean PG 5 mmHg at 94 bpm, which is likely normal. No mitral valve regurgitation. IVC is dilated with a respiratory response of >50%. Estimated RA pressure 8 mmHg. Compared to previous study on 01/14/2020, mitral regurgitation is now absent. LVEF is reduced from 55% to 40%.  02/19/2020 (Dr. Roxy Manns):  Mitral Valve Repair             Complex valvuloplasty including artificial Gore-tex neochord placement x8             Decalcification of posterior leaflet and posterior annulus             Sliding leaflet plasty             Suture plication of anterior commissure and P1/P2             Sorin Memo 4D ring annuloplasty (size 47m, ref #[id number], serial ##[id number]   Coronary Artery Bypass Grafting x 1              Left Internal Mammary Artery to Distal Left Anterior Descending Coronary Artery   Maze Procedure              complete bilateral atrial lesion set using bipolar radiofrequency and cryothermy ablation  clipping of left atrial appendage (Atriclip size 52m)  TEE 02/03/2020: 1. Left ventricular ejection fraction, by estimation, is 55 to 60%. The left ventricle has normal function. The left ventricle has no regional  wall motion abnormalities. Left ventricular diastolic function could not be evaluated.  2. Right ventricular systolic function is normal. The right ventricular size is normal.  3. Left atrial size was severely dilated. No left atrial/left atrial appendage thrombus was detected.  4. 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  5. No other significant valvular abnormality.   Coronary angiogram 02/03/2020: LM: Normal LAD: Mid LAD CTO with faint collaterals from Diag and RCA LCx: Prox OM1 focal 40% stenosis RCA: Normal  RA:  6 mmHg RV: 31/2 mmHg PA: 36/14 mmHg, mean PAP 23 mmHg PCW: Mean 16 mmHg with tall V wave LVEDP 7 mmHg CO: 5.4 L/min CI: 2.6 L/min.m2  Impression: Single vessel obstructive CAD (LAD CTO) Nonobstructive prox OM1 disease Tall V waves s/o severe mitral regurgitation  EKG 01/07/2020: Atrial fibrillation with controlled ventricular rate 85 bpm. Occasional ectopic ventricular beat. Poor R wave progression.  Recent labs: 04/23/2020: INR 1.7  02/24/2020: Glucose 113, BUN/Cr 14/0.8. EGFR 60. Na/K 136/3.5. Ca 8.1. Rest of the CMP normal H/H 9.9/29. Platelets 125 HbA1C 5.2%  11/05/2019: Glucose 93, BUN/Cr 23/0.8. EGFR normal. Na/K 140/4.3. Rest of the CMP normal H/H 13.7/39.6. MCV 88.2. Platelets 192 Chol 222, TG 65, HDL 65, LDL 145   Review of Systems  Cardiovascular: Negative for chest pain, dyspnea on exertion, leg swelling, palpitations and syncope.       Vitals:   05/06/20 0954  BP: 115/86  Pulse: 90  SpO2: 99%     Body mass index is 27.41 kg/m. Filed Weights   05/06/20 0954  Weight: 191 lb (86.6 kg)     Objective:   Physical Exam  Constitutional: No distress.  Neck: No JVD present.  Cardiovascular: Normal rate, regular rhythm, normal heart sounds and intact distal pulses.  No murmur heard. Pulmonary/Chest: Effort normal and breath sounds normal. He has no wheezes. He has no rales.  Sternotomy scar, well healed  Musculoskeletal:        General: No edema.  Nursing note and vitals reviewed.       Assessment & Recommendations:   64y.o.Caucasianmalewith parxysmal Afib, severe MR, CAD (LAD CTO), now s/p complex mitral valvuloplasty, CABGX1 (LIMA-LAD), Maze procedure.  Paroxysmal Afib: CHA2DS2VASc score 1, annual stroke risk 0.6%.  Recommend anticoagulation for 3 months (till June 2021) + Aspirin 81 mg. IF EF norma;lized on echocardiogram in July 2021, can stop echocardiogram  Exertional dyspnea: Resolved. Expect his EF to improve.  CAD: S/p  CABG (LIMA-LAD) No angina symptoms.  Continue Aspirin 81 mg.  Added Crestor 10 mg. He has been intolerant to Lipitor.  Repeat lipid panel in 3 months  F/u in 3 months  [name] JEsther Hardy MD [facility] Pager: [phone]: [phone]

## 2020-05-06 ENCOUNTER — Ambulatory Visit: Payer: BC Managed Care – PPO | Admitting: Cardiology

## 2020-05-06 ENCOUNTER — Encounter: Payer: Self-pay | Admitting: Cardiology

## 2020-05-06 VITALS — BP 115/86 | HR 90 | Ht 70.0 in | Wt 191.0 lb

## 2020-05-06 DIAGNOSIS — Z9889 Other specified postprocedural states: Secondary | ICD-10-CM

## 2020-05-06 DIAGNOSIS — I25810 Atherosclerosis of coronary artery bypass graft(s) without angina pectoris: Secondary | ICD-10-CM

## 2020-05-06 DIAGNOSIS — I480 Paroxysmal atrial fibrillation: Secondary | ICD-10-CM | POA: Diagnosis not present

## 2020-05-06 DIAGNOSIS — Z5181 Encounter for therapeutic drug level monitoring: Secondary | ICD-10-CM | POA: Diagnosis not present

## 2020-05-06 DIAGNOSIS — E782 Mixed hyperlipidemia: Secondary | ICD-10-CM | POA: Insufficient documentation

## 2020-05-06 DIAGNOSIS — Z7901 Long term (current) use of anticoagulants: Secondary | ICD-10-CM | POA: Diagnosis not present

## 2020-05-06 LAB — POCT INR: INR: 1.5 — AB (ref 2.0–3.0)

## 2020-05-06 MED ORDER — ROSUVASTATIN CALCIUM 10 MG PO TABS: 10.0000 mg | ORAL_TABLET | Freq: Every day | ORAL | 3 refills | Status: DC

## 2020-05-06 NOTE — Progress Notes (Signed)
Anticoagulation Management Daniel Browning is a 64 y.o. male who reports to the clinic for monitoring of warfarin treatment.    Indication: Paroxysmal AFib s/p Maze procedure CHA2DS2 Vasc Score 1, HAS-BLED 0  Duration: 3 months (till June 2021) Supervising physician: [facility] Visit History:  Patient does not report signs/symptoms of bleeding or thromboembolism   Other recent changes: No changes in diet, medications, lifestyle. Pt started cardiac rehab recently. Reports to be attending 3x/week. Improved exercise tolerance and reports to be doing really well with the program. Denies any complains of SOB, dyspnea upon exertion. Able to do a lot more around the house.   Anticoagulation Episode Summary    Current INR goal:  2.0-3.0  TTR:  17.1 % (2 mo)  Next INR check:  05/12/2020  INR from last check:  1.5 (05/06/2020)  Weekly max warfarin dose:  5 mg  Target end date:    INR check location:    Preferred lab:    Send INR reminders to:     Indications   Atrial fibrillation (HCC) [I48.91]       Comments:          No Known Allergies  Current Outpatient Medications:  .  acetaminophen (TYLENOL) 500 MG tablet, Take 2 tablets (1,000 mg total) by mouth every 6 (six) hours., Disp: 30 tablet, Rfl: 0 .  amoxicillin (AMOXIL) 500 MG tablet, Take 1 tablet (500 mg total) by mouth once as needed for up to 1 dose. 1 hour before dental procedure (Patient taking differently: Take 2,000 mg by mouth once as needed. 1 hour before dental procedure), Disp: 4 tablet, Rfl: 4 .  aspirin EC 81 MG EC tablet, Take 1 tablet (81 mg total) by mouth daily., Disp: , Rfl:  .  metoprolol tartrate (LOPRESSOR) 25 MG tablet, Take 0.5 tablets (12.5 mg total) by mouth 2 (two) times daily., Disp: 30 tablet, Rfl: 1 .  nitroGLYCERIN (NITROSTAT) 0.4 MG SL tablet, Place under the tongue., Disp: , Rfl:  .  omeprazole (PRILOSEC OTC) 20 MG tablet, Take 20 mg by mouth daily., Disp: , Rfl:  .  warfarin  (COUMADIN) 5 MG tablet, Take 1 tablet (5 mg total) by mouth daily at 6 PM. (Patient taking differently: Take 7.5 mg by mouth daily at 6 PM. 5 mg on Wednesdays), Disp: 30 tablet, Rfl: 3 .  rosuvastatin (CRESTOR) 10 MG tablet, Take 1 tablet (10 mg total) by mouth daily., Disp: 90 tablet, Rfl: 3 Past Medical History:  Diagnosis Date  . Atrial fibrillation ([place])   . Colon polyps   . Coronary artery disease   . GERD (gastroesophageal reflux disease)   . Heart murmur   . Mitral valve prolapse   . Nonrheumatic mitral valve regurgitation   . Persistent atrial fibrillation ([place])   . S/P CABG x 1 02/19/2020   LIMA to LAD  . S/P Maze operation for atrial fibrillation 02/19/2020   Complete bilateral atrial lesion set using bipolar radiofrequency ablation and cryothermy with clipping of LA appendage via median sternotomy approach  . S/P mitral valve repair 02/19/2020   Complex valvuloplasty including artificial Gore-tex neochord placement x8, decalcification of posterior leaflet and posterior annulus with sliding leaflet plasty and 34 mm Sorin Memo 4D ring annuloplasty    ASSESSMENT  Recent Results: The most recent result is correlated with 50 mg per week:  Lab Results  Component Value Date   INR 1.5 (A) 05/06/2020   INR 1.7 (A) 04/23/2020   INR  1.9 (A) 04/08/2020    Anticoagulation Dosing: Description   INR below goal. Take 15 mg today and 10 mg tomorrow and then continue taking 5 mg on W and 7.5 mg all other days. Recheck INR in 6 days.       INR today: Subtherapeutic. INR continues to trend down despite dose increase last check. Pt denies any missed doses, changes in his medications, increased Vit-K intake. Denies any identifiable causes for the INR decrease. Recent dose increase, will boost today and tomorrow and reevaluate INR reading next week. Will consider weekly dose increase next week. Pt reports that he has been exercising more often recently and that's the only identifiable change.  Denies and s/sx of stroke. Pt is scheduled for an ECHO in July to determine need for continued warfarin therapy. Pt continues to remain in sinus rhythm.  PLAN Weekly dose was unchanges. Take 20 mg today and 15 mg tomorrow, continue with current dose of 5 mg every Wed and 7.5 mg all other days. Recheck INR in 6 days  Patient Instructions  INR below goal. Take 15 mg today and 10 mg tomorrow and then continue taking 5 mg on W and 7.5 mg all other days. Recheck INR in 6 days.   Patient advised to contact clinic or seek medical attention if signs/symptoms of bleeding or thromboembolism occur.  Patient verbalized understanding by repeating back information and was advised to contact me if further medication-related questions arise.   Follow-up Return in about 6 days (around 05/12/2020).  Alysia Penna, PharmD  15 minutes spent face-to-face with the patient during the encounter. 50% of time spent on education, including signs/sx bleeding and clotting, as well as food and drug interactions with warfarin. 50% of time was spent on fingerprick POC INR sample collection,processing, results determination, and documentation

## 2020-05-06 NOTE — Patient Instructions (Addendum)
INR below goal. Take 15 mg today and 10 mg tomorrow and then continue taking 5 mg on W and 7.5 mg all other days. Recheck INR in 6 days.

## 2020-05-07 ENCOUNTER — Encounter (HOSPITAL_COMMUNITY)
Admission: RE | Admit: 2020-05-07 | Discharge: 2020-05-07 | Disposition: A | Discharge status: Home / Self Care | Payer: BC Managed Care – PPO | Source: Ambulatory Visit | Attending: Cardiology | Admitting: Cardiology

## 2020-05-07 ENCOUNTER — Other Ambulatory Visit: Payer: Self-pay

## 2020-05-07 DIAGNOSIS — Z951 Presence of aortocoronary bypass graft: Secondary | ICD-10-CM | POA: Diagnosis not present

## 2020-05-07 DIAGNOSIS — Z9889 Other specified postprocedural states: Secondary | ICD-10-CM

## 2020-05-12 ENCOUNTER — Other Ambulatory Visit: Payer: Self-pay

## 2020-05-12 ENCOUNTER — Encounter (HOSPITAL_COMMUNITY)
Admission: RE | Admit: 2020-05-12 | Discharge: 2020-05-12 | Disposition: A | Discharge status: Home / Self Care | Payer: BC Managed Care – PPO | Source: Ambulatory Visit | Attending: Cardiology | Admitting: Cardiology

## 2020-05-12 ENCOUNTER — Ambulatory Visit: Payer: BC Managed Care – PPO | Admitting: Pharmacist

## 2020-05-12 DIAGNOSIS — Z7901 Long term (current) use of anticoagulants: Secondary | ICD-10-CM

## 2020-05-12 DIAGNOSIS — Z9889 Other specified postprocedural states: Secondary | ICD-10-CM | POA: Insufficient documentation

## 2020-05-12 DIAGNOSIS — Z5181 Encounter for therapeutic drug level monitoring: Secondary | ICD-10-CM

## 2020-05-12 DIAGNOSIS — I480 Paroxysmal atrial fibrillation: Secondary | ICD-10-CM | POA: Diagnosis not present

## 2020-05-12 DIAGNOSIS — Z951 Presence of aortocoronary bypass graft: Secondary | ICD-10-CM | POA: Diagnosis not present

## 2020-05-12 LAB — POCT INR: INR: 1.7 — AB (ref 2.0–3.0)

## 2020-05-12 NOTE — Progress Notes (Signed)
Cardiac Individual Treatment Plan  Patient Details  Name: Daniel Browning MRN: [id number] Date of Birth: [birth year] Referring Provider:     [facility] from 04/01/2020 in [facility]  Referring Provider  [place], [place] J      Initial Encounter Date:    CARDIAC REHAB PHASE II ORIENTATION from 04/01/2020 in [facility]  Date  04/01/20      Visit Diagnosis: S/P CABG x 1 02/19/20  S/P mitral valve repair, Maze procedure 02/19/20  Patient's Home Medications on Admission:  Current Outpatient Medications:    acetaminophen (TYLENOL) 500 MG tablet, Take 2 tablets (1,000 mg total) by mouth every 6 (six) hours., Disp: 30 tablet, Rfl: 0   amoxicillin (AMOXIL) 500 MG tablet, Take 1 tablet (500 mg total) by mouth once as needed for up to 1 dose. 1 hour before dental procedure (Patient taking differently: Take 2,000 mg by mouth once as needed. 1 hour before dental procedure), Disp: 4 tablet, Rfl: 4   aspirin EC 81 MG EC tablet, Take 1 tablet (81 mg total) by mouth daily., Disp: , Rfl:    metoprolol tartrate (LOPRESSOR) 25 MG tablet, Take 0.5 tablets (12.5 mg total) by mouth 2 (two) times daily., Disp: 30 tablet, Rfl: 1   nitroGLYCERIN (NITROSTAT) 0.4 MG SL tablet, Place under the tongue., Disp: , Rfl:    omeprazole (PRILOSEC OTC) 20 MG tablet, Take 20 mg by mouth daily., Disp: , Rfl:    rosuvastatin (CRESTOR) 10 MG tablet, Take 1 tablet (10 mg total) by mouth daily., Disp: 90 tablet, Rfl: 3   warfarin (COUMADIN) 5 MG tablet, Take 1 tablet (5 mg total) by mouth daily at 6 PM. (Patient taking differently: Take 7.5 mg by mouth daily at 6 PM. 5 mg on Wednesdays), Disp: 30 tablet, Rfl: 3  Past Medical History: Past Medical History:  Diagnosis Date   Atrial fibrillation (HCC)    Colon polyps    Coronary artery disease    GERD (gastroesophageal reflux disease)    Heart murmur    Mitral valve prolapse     Nonrheumatic mitral valve regurgitation    Persistent atrial fibrillation (HCC)    S/P CABG x 1 02/19/2020   LIMA to LAD   S/P Maze operation for atrial fibrillation 02/19/2020   Complete bilateral atrial lesion set using bipolar radiofrequency ablation and cryothermy with clipping of LA appendage via median sternotomy approach   S/P mitral valve repair 02/19/2020   Complex valvuloplasty including artificial Gore-tex neochord placement x8, decalcification of posterior leaflet and posterior annulus with sliding leaflet plasty and 34 mm Sorin Memo 4D ring annuloplasty    Tobacco Use: Social History   Tobacco Use  Smoking Status Never Smoker  Smokeless Tobacco Never Used    Labs: Recent Review Flowsheet Data    Labs for ITP Cardiac and Pulmonary Rehab Latest Ref Rng & Units 02/19/2020 02/19/2020 02/19/2020 02/19/2020 02/19/2020   Hemoglobin A1c 4.8 - 5.6 % - - - - -   PHART 7.350 - 7.450 - 7.332(L) 7.292(L) 7.377 7.341(L)   PCO2ART 32.0 - 48.0 mmHg - 43.7 46.0 32.7 36.3   HCO3 20.0 - 28.0 mmol/L - 23.1 22.1 19.2(L) 19.6(L)   TCO2 22 - 32 mmol/L 24 24 23  20(L) 21(L)   ACIDBASEDEF 0.0 - 2.0 mmol/L - 3.0(H) 4.0(H) 5.0(H) 6.0(H)   O2SAT % - 100.0 90.0 96.0 97.0      Capillary Blood Glucose: Lab Results  Component  Value Date   GLUCAP 116 (H) 02/23/2020   GLUCAP 97 02/23/2020   GLUCAP 139 (H) 02/23/2020   GLUCAP 106 (H) 02/23/2020   GLUCAP 142 (H) 02/22/2020     Exercise Target Goals: Exercise Program Goal: Individual exercise prescription set using results from initial 6 min walk test and THRR while considering  patients activity barriers and safety.   Exercise Prescription Goal: Initial exercise prescription builds to 30-45 minutes a day of aerobic activity, 2-3 days per week.  Home exercise guidelines will be given to patient during program as part of exercise prescription that the participant will acknowledge.  Activity Barriers & Risk Stratification: Activity Barriers &  Cardiac Risk Stratification - 04/01/20 1038      Activity Barriers & Cardiac Risk Stratification   Activity Barriers  None    Cardiac Risk Stratification  High       6 Minute Walk: 6 Minute Walk    Row Name 04/01/20 1036         6 Minute Walk   Phase  Initial     Distance  1702 feet     Walk Time  6 minutes     # of Rest Breaks  0     MPH  3.2     METS  4     RPE  7     Perceived Dyspnea   0     VO2 Peak  14.03     Symptoms  No     Resting HR  91 bpm     Resting BP  104/72     Resting Oxygen Saturation   100 %     Exercise Oxygen Saturation  during 6 min walk  100 %     Max Ex. HR  102 bpm     Max Ex. BP  120/60     2 Minute Post BP  110/70        Oxygen Initial Assessment:   Oxygen Re-Evaluation:   Oxygen Discharge (Final Oxygen Re-Evaluation):   Initial Exercise Prescription: Initial Exercise Prescription - 04/01/20 1000      Date of Initial Exercise RX and Referring Provider   Date  04/01/20    Referring Provider  Nigel Mormon    Expected Discharge Date  06/04/20      Treadmill   MPH  2.3    Grade  1    Minutes  15    METs  3.08      NuStep   Level  2    SPM  85    Minutes  15    METs  3      Prescription Details   Frequency (times per week)  3x    Duration  Progress to 30 minutes of continuous aerobic without signs/symptoms of physical distress      Intensity   THRR 40-80% of Max Heartrate  63-126    Ratings of Perceived Exertion  11-13    Perceived Dyspnea  0-4      Progression   Progression  Continue progressive overload as per policy without signs/symptoms or physical distress.      Resistance Training   Training Prescription  Yes    Weight  4lbs    Reps  10-15       Perform Capillary Blood Glucose checks as needed.  Exercise Prescription Changes:  Exercise Prescription Changes    Row Name 04/12/20 0708 04/26/20 0705 05/07/20 0700         Response  to Exercise   Blood Pressure (Admit)  102/62  132/78  108/60      Blood Pressure (Exercise)  124/76  110/70  128/64     Blood Pressure (Exit)  110/62  124/72  104/62     Heart Rate (Admit)  96 bpm  91 bpm  94 bpm     Heart Rate (Exercise)  106 bpm  120 bpm  141 bpm     Heart Rate (Exit)  94 bpm  98 bpm  95 bpm     Rating of Perceived Exertion (Exercise)  7  11  11      Symptoms  none  none  none     Comments  Off to a good start with exercise.  --  --     Duration  Continue with 30 min of aerobic exercise without signs/symptoms of physical distress.  Continue with 30 min of aerobic exercise without signs/symptoms of physical distress.  Continue with 30 min of aerobic exercise without signs/symptoms of physical distress.     Intensity  THRR unchanged  THRR unchanged  THRR unchanged       Progression   Progression  Continue to progress workloads to maintain intensity without signs/symptoms of physical distress.  Continue to progress workloads to maintain intensity without signs/symptoms of physical distress.  Continue to progress workloads to maintain intensity without signs/symptoms of physical distress.     Average METs  3.6  5.1  6       Resistance Training   Training Prescription  Yes  Yes  Yes     Weight  4lbs  5lbs  6lbs     Reps  10-15  10-15  10-15     Time  10 Minutes  10 Minutes  10 Minutes       Interval Training   Interval Training  No  No  No       Treadmill   MPH  2.8  3.3  3.5     Grade  2  3  4      Minutes  15  15  15      METs  3.92  4.89  5.61       NuStep   Level  2  5  5      SPM  85  85  85     Minutes  15  15  15      METs  3.3  5.3  6.4       Home Exercise Plan   Plans to continue exercise at  --  --  Home (comment) Walking     Frequency  --  --  Add 4 additional days to program exercise sessions.     Initial Home Exercises Provided  --  --  04/28/20        Exercise Comments:  Exercise Comments    Row Name 04/12/20 0813 04/28/20 0704 05/11/20 1337 05/12/20 0650     Exercise Comments  Patient tolerated first  session of exercise well without symptoms.  Reviewed home exercise guidelines, METs, and goals with patient.  Sent request for THRR increase.  Reviewed METs and goals with patient.       Exercise Goals and Review:  Exercise Goals    Row Name 04/01/20 1038             Exercise Goals   Increase Physical Activity  Yes       Intervention  Provide advice, education, support and counseling about physical activity/exercise needs.;Develop  an individualized exercise prescription for aerobic and resistive training based on initial evaluation findings, risk stratification, comorbidities and participant's personal goals.       Expected Outcomes  Short Term: Attend rehab on a regular basis to increase amount of physical activity.;Long Term: Exercising regularly at least 3-5 days a week.;Long Term: Add in home exercise to make exercise part of routine and to increase amount of physical activity.       Increase Strength and Stamina  Yes       Intervention  Provide advice, education, support and counseling about physical activity/exercise needs.;Develop an individualized exercise prescription for aerobic and resistive training based on initial evaluation findings, risk stratification, comorbidities and participant's personal goals.       Expected Outcomes  Short Term: Increase workloads from initial exercise prescription for resistance, speed, and METs.;Short Term: Perform resistance training exercises routinely during rehab and add in resistance training at home;Long Term: Improve cardiorespiratory fitness, muscular endurance and strength as measured by increased METs and functional capacity (6MWT)       Able to understand and use rate of perceived exertion (RPE) scale  Yes       Intervention  Provide education and explanation on how to use RPE scale       Expected Outcomes  Short Term: Able to use RPE daily in rehab to express subjective intensity level;Long Term:  Able to use RPE to guide intensity level  when exercising independently       Knowledge and understanding of Target Heart Rate Range (THRR)  Yes       Intervention  Provide education and explanation of THRR including how the numbers were predicted and where they are located for reference       Expected Outcomes  Short Term: Able to state/look up THRR;Long Term: Able to use THRR to govern intensity when exercising independently;Short Term: Able to use daily as guideline for intensity in rehab       Able to check pulse independently  Yes       Intervention  Provide education and demonstration on how to check pulse in carotid and radial arteries.;Review the importance of being able to check your own pulse for safety during independent exercise       Expected Outcomes  Short Term: Able to explain why pulse checking is important during independent exercise;Long Term: Able to check pulse independently and accurately       Understanding of Exercise Prescription  Yes       Intervention  Provide education, explanation, and written materials on patient's individual exercise prescription       Expected Outcomes  Short Term: Able to explain program exercise prescription;Long Term: Able to explain home exercise prescription to exercise independently          Exercise Goals Re-Evaluation : Exercise Goals Re-Evaluation    Row Name 04/12/20 0813 04/28/20 0704 05/12/20 0650         Exercise Goal Re-Evaluation   Exercise Goals Review  Increase Physical Activity;Able to understand and use rate of perceived exertion (RPE) scale  Increase Physical Activity;Able to understand and use rate of perceived exertion (RPE) scale;Increase Strength and Stamina;Knowledge and understanding of Target Heart Rate Range (THRR);Understanding of Exercise Prescription  Increase Physical Activity;Able to understand and use rate of perceived exertion (RPE) scale;Increase Strength and Stamina;Knowledge and understanding of Target Heart Rate Range (THRR);Understanding of Exercise  Prescription     Comments  Patient able to understand and use RPE scale appropriately.  Reviewed home exercise guidelines with patient including endpoints, temperature precautions, target heart rate and rate of perceived exertion. Pt is walking 60 minutes on the days he doesn't attend cardiac rehab and at 30 minutes on the days he does attend cardiac rehab as his mode of home exercise. Patient has 2,5, and 8 lb weights at home that he can use for his resistance training. Patient not currently doing weights at home. Instructed patient on manual pulse counting, handout given. Pt voices understanding of instructions given.  Patient is doing well with his exercise. Patient is walking daily and has no concerns at this time. Increasing workloads appropriately. Increased hand weights from 5 to 6lbs. Patient states he's been cleared to resume golf 05/21/20, which was one of his goals.     Expected Outcomes  Increase workloads as tolerated to help increase strength and stamina.  Patient will continue daily exercise routine to help achieve personal health and fitness goals.  Patient will continue current daily exercise routine.        Discharge Exercise Prescription (Final Exercise Prescription Changes): Exercise Prescription Changes - 05/07/20 0700      Response to Exercise   Blood Pressure (Admit)  108/60    Blood Pressure (Exercise)  128/64    Blood Pressure (Exit)  104/62    Heart Rate (Admit)  94 bpm    Heart Rate (Exercise)  141 bpm    Heart Rate (Exit)  95 bpm    Rating of Perceived Exertion (Exercise)  11    Symptoms  none    Duration  Continue with 30 min of aerobic exercise without signs/symptoms of physical distress.    Intensity  THRR unchanged      Progression   Progression  Continue to progress workloads to maintain intensity without signs/symptoms of physical distress.    Average METs  6      Resistance Training   Training Prescription  Yes    Weight  6lbs    Reps  10-15    Time  10  Minutes      Interval Training   Interval Training  No      Treadmill   MPH  3.5    Grade  4    Minutes  15    METs  5.61      NuStep   Level  5    SPM  85    Minutes  15    METs  6.4      Home Exercise Plan   Plans to continue exercise at  Home (comment)   Walking   Frequency  Add 4 additional days to program exercise sessions.    Initial Home Exercises Provided  04/28/20       Nutrition:  Target Goals: Understanding of nutrition guidelines, daily intake of sodium 1500mg , cholesterol 200mg , calories 30% from fat and 7% or less from saturated fats, daily to have 5 or more servings of fruits and vegetables.  Biometrics: Pre Biometrics - 04/01/20 1038      Pre Biometrics   Height  5' 9.75" (1.772 m)    Weight  86.8 kg    Waist Circumference  40 inches    Hip Circumference  42 inches    Waist to Hip Ratio  0.95 %    BMI (Calculated)  27.64    Triceps Skinfold  12 mm    % Body Fat  37 %    Grip Strength  42 kg    Flexibility  12.5  in    Single Leg Stand  94 seconds        Nutrition Therapy Plan and Nutrition Goals: Nutrition Therapy & Goals - 04/16/20 0818      Nutrition Therapy   Diet  Heart Healthy      Personal Nutrition Goals   Nutrition Goal  Pt to build a healthy plate including vegetables, fruits, whole grains, and low-fat dairy products in a heart healthy meal plan.    Personal Goal #2  Pt to increase fiber intake by 5 g daily by incorporating vegetables and whole grains for goal of 25 g/day      Intervention Plan   Intervention  Nutrition handout(s) given to patient.;Prescribe, educate and counsel regarding individualized specific dietary modifications aiming towards targeted core components such as weight, hypertension, lipid management, diabetes, heart failure and other comorbidities.    Expected Outcomes  Short Term Goal: A plan has been developed with personal nutrition goals set during dietitian appointment.;Long Term Goal: Adherence to  prescribed nutrition plan.       Nutrition Assessments: Nutrition Assessments - 04/15/20 0833      MEDFICTS Scores   Pre Score  107       Nutrition Goals Re-Evaluation: Nutrition Goals Re-Evaluation    Row Name 04/16/20 0820             Goals   Current Weight  191 lb (86.6 kg)       Nutrition Goal  Pt to build a healthy plate including vegetables, fruits, whole grains, and low-fat dairy products in a heart healthy meal plan.       Expected Outcome  Adequate fiber intake and increased nutrient density         Personal Goal #2 Re-Evaluation   Personal Goal #2  Pt to increase fiber intake by 5 g daily by incorporating vegetables and whole grains for goal of 25 g/day          Nutrition Goals Re-Evaluation: Nutrition Goals Re-Evaluation    Row Name 04/16/20 0820             Goals   Current Weight  191 lb (86.6 kg)       Nutrition Goal  Pt to build a healthy plate including vegetables, fruits, whole grains, and low-fat dairy products in a heart healthy meal plan.       Expected Outcome  Adequate fiber intake and increased nutrient density         Personal Goal #2 Re-Evaluation   Personal Goal #2  Pt to increase fiber intake by 5 g daily by incorporating vegetables and whole grains for goal of 25 g/day          Nutrition Goals Discharge (Final Nutrition Goals Re-Evaluation): Nutrition Goals Re-Evaluation - 04/16/20 0820      Goals   Current Weight  191 lb (86.6 kg)    Nutrition Goal  Pt to build a healthy plate including vegetables, fruits, whole grains, and low-fat dairy products in a heart healthy meal plan.    Expected Outcome  Adequate fiber intake and increased nutrient density      Personal Goal #2 Re-Evaluation   Personal Goal #2  Pt to increase fiber intake by 5 g daily by incorporating vegetables and whole grains for goal of 25 g/day       Psychosocial: Target Goals: Acknowledge presence or absence of significant depression and/or stress, maximize  coping skills, provide positive support system. Participant is able to verbalize types and ability  to use techniques and skills needed for reducing stress and depression.  Initial Review & Psychosocial Screening: Initial Psych Review & Screening - 04/01/20 1138      Initial Review   Current issues with  None Identified      Family Dynamics   Good Support System?  Yes   Daniel Browning has his wife for support     Barriers   Psychosocial barriers to participate in program  There are no identifiable barriers or psychosocial needs.      Screening Interventions   Interventions  Encouraged to exercise       Quality of Life Scores: Quality of Life - 04/01/20 1039      Quality of Life   Select  Quality of Life      Quality of Life Scores   Health/Function Pre  22.63 %    Socioeconomic Pre  25.21 %    Psych/Spiritual Pre  25.21 %    Family Pre  22.6 %    GLOBAL Pre  23.79 %      Scores of 19 and below usually indicate a poorer quality of life in these areas.  A difference of  2-3 points is a clinically meaningful difference.  A difference of 2-3 points in the total score of the Quality of Life Index has been associated with significant improvement in overall quality of life, self-image, physical symptoms, and general health in studies assessing change in quality of life.  PHQ-9: Recent Review Flowsheet Data    Depression screen [facility] 2/9 04/01/2020   Decreased Interest 0   Down, Depressed, Hopeless 0   PHQ - 2 Score 0     Interpretation of Total Score  Total Score Depression Severity:  1-4 = Minimal depression, 5-9 = Mild depression, 10-14 = Moderate depression, 15-19 = Moderately severe depression, 20-27 = Severe depression   Psychosocial Evaluation and Intervention: Psychosocial Evaluation - 04/12/20 1130      Psychosocial Evaluation & Interventions   Interventions  Encouraged to exercise with the program and follow exercise prescription    Comments  Daniel Browning denies psychosocial  barriers to participation in CR and self health management. He has a positive outlook and attitude and eager to return to work for [employer]. He enjoys playing tennis and golf and doing "handyman tasks" around the home. He has a strong support system. No psychosocial interventions needed.    Expected Outcomes  Patient will continue to maintain a positive attitude and outlook. He will utilize his support system when needed. He will continue to enjoy his hobbies for stress reduction.    Continue Psychosocial Services   No Follow up required       Psychosocial Re-Evaluation: Psychosocial Re-Evaluation    [place] Name 05/12/20 [phone]             Psychosocial Re-Evaluation   Current issues with  None Identified       Comments  Daniel Browning continues to maintain a positive attitude and outlook. He continues to endorse a strong support system including family and friends. He denies s/s of depression or need for psychosocial interventions at this time.       Expected Outcomes  Daniel Browning will continue to maintain a positive attitude and outlook. He will utilize his support system as needed.       Interventions  Encouraged to attend Cardiac Rehabilitation for the exercise       Continue Psychosocial Services   No Follow up required  Psychosocial Discharge (Final Psychosocial Re-Evaluation): Psychosocial Re-Evaluation - 05/12/20 [id number]      Psychosocial Re-Evaluation   Current issues with  None Identified    Comments  Daniel Browning continues to maintain a positive attitude and outlook. He continues to endorse a strong support system including family and friends. He denies s/s of depression or need for psychosocial interventions at this time.    Expected Outcomes  Daniel Browning will continue to maintain a positive attitude and outlook. He will utilize his support system as needed.    Interventions  Encouraged to attend Cardiac Rehabilitation for the exercise    Continue Psychosocial Services   No Follow up  required       Vocational Rehabilitation: Provide vocational rehab assistance to qualifying candidates.   Vocational Rehab Evaluation & Intervention: Vocational Rehab - 04/01/20 1139      Initial Vocational Rehab Evaluation & Intervention   Assessment shows need for Vocational Rehabilitation  No       Education: Education Goals: Education classes will be provided on a weekly basis, covering required topics. Participant will state understanding/return demonstration of topics presented.  Learning Barriers/Preferences: Learning Barriers/Preferences - 04/01/20 1040      Learning Barriers/Preferences   Learning Barriers  Sight    Learning Preferences  Written Material;Skilled Demonstration;Individual Instruction       Education Topics: Count Your Pulse:  -Group instruction provided by verbal instruction, demonstration, patient participation and written materials to support subject.  Instructors address importance of being able to find your pulse and how to count your pulse when at home without a heart monitor.  Patients get hands on experience counting their pulse with staff help and individually.   Heart Attack, Angina, and Risk Factor Modification:  -Group instruction provided by verbal instruction, video, and written materials to support subject.  Instructors address signs and symptoms of angina and heart attacks.    Also discuss risk factors for heart disease and how to make changes to improve heart health risk factors.   Functional Fitness:  -Group instruction provided by verbal instruction, demonstration, patient participation, and written materials to support subject.  Instructors address safety measures for doing things around the house.  Discuss how to get up and down off the floor, how to pick things up properly, how to safely get out of a chair without assistance, and balance training.   Meditation and Mindfulness:  -Group instruction provided by verbal instruction,  patient participation, and written materials to support subject.  Instructor addresses importance of mindfulness and meditation practice to help reduce stress and improve awareness.  Instructor also leads participants through a meditation exercise.    Stretching for Flexibility and Mobility:  -Group instruction provided by verbal instruction, patient participation, and written materials to support subject.  Instructors lead participants through series of stretches that are designed to increase flexibility thus improving mobility.  These stretches are additional exercise for major muscle groups that are typically performed during regular warm up and cool down.   Hands Only CPR:  -Group verbal, video, and participation provides a basic overview of AHA guidelines for community CPR. Role-play of emergencies allow participants the opportunity to practice calling for help and chest compression technique with discussion of AED use.   Hypertension: -Group verbal and written instruction that provides a basic overview of hypertension including the most recent diagnostic guidelines, risk factor reduction with self-care instructions and medication management.    Nutrition I class: Heart Healthy Eating:  -Group instruction provided by PowerPoint slides,  verbal discussion, and written materials to support subject matter. The instructor gives an explanation and review of the Therapeutic Lifestyle Changes diet recommendations, which includes a discussion on lipid goals, dietary fat, sodium, fiber, plant stanol/sterol esters, sugar, and the components of a well-balanced, healthy diet.   Nutrition II class: Lifestyle Skills:  -Group instruction provided by PowerPoint slides, verbal discussion, and written materials to support subject matter. The instructor gives an explanation and review of label reading, grocery shopping for heart health, heart healthy recipe modifications, and ways to make healthier choices  when eating out.   Diabetes Question & Answer:  -Group instruction provided by PowerPoint slides, verbal discussion, and written materials to support subject matter. The instructor gives an explanation and review of diabetes co-morbidities, pre- and post-prandial blood glucose goals, pre-exercise blood glucose goals, signs, symptoms, and treatment of hypoglycemia and hyperglycemia, and foot care basics.   Diabetes Blitz:  -Group instruction provided by PowerPoint slides, verbal discussion, and written materials to support subject matter. The instructor gives an explanation and review of the physiology behind type 1 and type 2 diabetes, diabetes medications and rational behind using different medications, pre- and post-prandial blood glucose recommendations and Hemoglobin A1c goals, diabetes diet, and exercise including blood glucose guidelines for exercising safely.    Portion Distortion:  -Group instruction provided by PowerPoint slides, verbal discussion, written materials, and food models to support subject matter. The instructor gives an explanation of serving size versus portion size, changes in portions sizes over the last 20 years, and what consists of a serving from each food group.   Stress Management:  -Group instruction provided by verbal instruction, video, and written materials to support subject matter.  Instructors review role of stress in heart disease and how to cope with stress positively.     Exercising on Your Own:  -Group instruction provided by verbal instruction, power point, and written materials to support subject.  Instructors discuss benefits of exercise, components of exercise, frequency and intensity of exercise, and end points for exercise.  Also discuss use of nitroglycerin and activating EMS.  Review options of places to exercise outside of rehab.  Review guidelines for sex with heart disease.   Cardiac Drugs I:  -Group instruction provided by verbal  instruction and written materials to support subject.  Instructor reviews cardiac drug classes: antiplatelets, anticoagulants, beta blockers, and statins.  Instructor discusses reasons, side effects, and lifestyle considerations for each drug class.   Cardiac Drugs II:  -Group instruction provided by verbal instruction and written materials to support subject.  Instructor reviews cardiac drug classes: angiotensin converting enzyme inhibitors (ACE-I), angiotensin II receptor blockers (ARBs), nitrates, and calcium channel blockers.  Instructor discusses reasons, side effects, and lifestyle considerations for each drug class.   Anatomy and Physiology of the Circulatory System:  Group verbal and written instruction and models provide basic cardiac anatomy and physiology, with the coronary electrical and arterial systems. Review of: AMI, Angina, Valve disease, Heart Failure, Peripheral Artery Disease, Cardiac Arrhythmia, Pacemakers, and the ICD.   Other Education:  -Group or individual verbal, written, or video instructions that support the educational goals of the cardiac rehab program.   Holiday Eating Survival Tips:  -Group instruction provided by PowerPoint slides, verbal discussion, and written materials to support subject matter. The instructor gives patients tips, tricks, and techniques to help them not only survive but enjoy the holidays despite the onslaught of food that accompanies the holidays.   Knowledge Questionnaire Score: Knowledge Questionnaire Score - 04/01/20  1040      Knowledge Questionnaire Score   Pre Score  21/24       Core Components/Risk Factors/Patient Goals at Admission: Personal Goals and Risk Factors at Admission - 04/01/20 1139      Core Components/Risk Factors/Patient Goals on Admission    Weight Management  Yes;Weight Maintenance    Intervention  Weight Management: Develop a combined nutrition and exercise program designed to reach desired caloric intake,  while maintaining appropriate intake of nutrient and fiber, sodium and fats, and appropriate energy expenditure required for the weight goal.;Weight Management: Provide education and appropriate resources to help participant work on and attain dietary goals.    Expected Outcomes  Weight Maintenance: Understanding of the daily nutrition guidelines, which includes 25-35% calories from fat, 7% or less cal from saturated fats, less than 200mg  cholesterol, less than 1.5gm of sodium, & 5 or more servings of fruits and vegetables daily;Understanding recommendations for meals to include 15-35% energy as protein, 25-35% energy from fat, 35-60% energy from carbohydrates, less than 200mg  of dietary cholesterol, 20-35 gm of total fiber daily;Understanding of distribution of calorie intake throughout the day with the consumption of 4-5 meals/snacks       Core Components/Risk Factors/Patient Goals Review:  Goals and Risk Factor Review    Row Name 04/12/20 1133 05/12/20 1008           Core Components/Risk Factors/Patient Goals Review   Personal Goals Review  Weight Management/Obesity;Lipids;Hypertension  Weight Management/Obesity;Lipids;Hypertension      Review  Daniel Browning has multiple CAD risk factors. He is currently walking 2 hours a day and eager to participate in CR for education and risk factor modification. His goals are to have the stamina and strength to return to jogging for exercise and hopes to be able to continue to play golf and jog for time to come.  Daniel Browning has multiple CAD risk factors. He is currently walking at home and eager to continue participation in CR for education and risk factor modification. His goals are to have the stamina and strength to return to jogging for exercise and hopes to be able to continue to play golf and jog for time to come. He currently feels he is ready to begin a jogging program and will work with EP for goal setting. He continues to take all medications as prescribe  and his BP is well within goal parameters.      Expected Outcomes  Paitent will continue to participate in CR for risk factor modification  Paitent will continue to participate in CR for risk factor modification         Core Components/Risk Factors/Patient Goals at Discharge (Final Review):  Goals and Risk Factor Review - 05/12/20 1008      Core Components/Risk Factors/Patient Goals Review   Personal Goals Review  Weight Management/Obesity;Lipids;Hypertension    Review  Daniel Browning has multiple CAD risk factors. He is currently walking at home and eager to continue participation in CR for education and risk factor modification. His goals are to have the stamina and strength to return to jogging for exercise and hopes to be able to continue to play golf and jog for time to come. He currently feels he is ready to begin a jogging program and will work with EP for goal setting. He continues to take all medications as prescribe and his BP is well within goal parameters.    Expected Outcomes  Paitent will continue to participate in CR for risk factor modification  ITP Comments: ITP Comments    Row Name 04/01/20 1035 04/01/20 1135 04/12/20 1128 05/12/20 0947     ITP Comments  Dr. Fransico Him, Medical Director  Dr. Fransico Him MD, Medical Director  30 day ITP review: Today, Daniel Browning completed his first cardiac rehab exercise session and tolerated well. VSS. Denied complaints. Reported RPE of 7 on NuStep and Treadmill at prescribed workloads.  30 day ITP review: Daniel Browning continues to do extremely well in his cardiac rehab exercise sessions. He is self motivated and initiates workload increases appropriately. Today his average mets were 6.6 on the NuStep. He feels he is gaining even more strength that he had prior to his cardiac surgery. He feels he has increase his leg strength and ready to being a running program. He denies any cardiac complaints and his VS remain well within limits for  exercise. He is encouraged by how well and fit he feels this far into the CR program.       Comments: see ITP comments

## 2020-05-12 NOTE — Patient Instructions (Signed)
INR below goal. Increase weekly dose to  10 mg on Wed and Fri and 7.5 mg all other days. Recheck INR in 2 weeks

## 2020-05-12 NOTE — Progress Notes (Addendum)
Anticoagulation Management Daniel Browning is a 64 y.o. male who reports to the clinic for monitoring of warfarin treatment.    Indication: Paroxysmal AFib s/p Maze procedure CHA2DS2 Vasc Score 1, HAS-BLED 0  Duration: 3 months (till June 2021) Supervising physician: [facility] Visit History:  Patient does not report signs/symptoms of bleeding or thromboembolism   Other recent changes: No changes in diet, medications, lifestyle. Pt started cardiac rehab recently. Reports to be attending 3x/week. Improved exercise tolerance and reports to be doing really well with the program. Denies any complains of SOB, dyspnea upon exertion. Able to do a lot more around the house.   Anticoagulation Episode Summary    Current INR goal:  2.0-3.0  TTR:  15.6 % (2.2 mo)  Next INR check:  05/26/2020  INR from last check:  1.7 (05/12/2020)  Weekly max warfarin dose:  5 mg  Target end date:    INR check location:    Preferred lab:    Send INR reminders to:     Indications   Atrial fibrillation (HCC) [I48.91]       Comments:          No Known Allergies  Current Outpatient Medications:    acetaminophen (TYLENOL) 500 MG tablet, Take 2 tablets (1,000 mg total) by mouth every 6 (six) hours., Disp: 30 tablet, Rfl: 0   amoxicillin (AMOXIL) 500 MG tablet, Take 1 tablet (500 mg total) by mouth once as needed for up to 1 dose. 1 hour before dental procedure (Patient taking differently: Take 2,000 mg by mouth once as needed. 1 hour before dental procedure), Disp: 4 tablet, Rfl: 4   aspirin EC 81 MG EC tablet, Take 1 tablet (81 mg total) by mouth daily., Disp: , Rfl:    metoprolol tartrate (LOPRESSOR) 25 MG tablet, Take 0.5 tablets (12.5 mg total) by mouth 2 (two) times daily., Disp: 30 tablet, Rfl: 1   nitroGLYCERIN (NITROSTAT) 0.4 MG SL tablet, Place under the tongue., Disp: , Rfl:    omeprazole (PRILOSEC OTC) 20 MG tablet, Take 20 mg by mouth daily., Disp: , Rfl:     rosuvastatin (CRESTOR) 10 MG tablet, Take 1 tablet (10 mg total) by mouth daily., Disp: 90 tablet, Rfl: 3   warfarin (COUMADIN) 5 MG tablet, Take 1 tablet (5 mg total) by mouth daily at 6 PM. (Patient taking differently: Take 7.5 mg by mouth daily at 6 PM. 5 mg on Wednesdays), Disp: 30 tablet, Rfl: 3 Past Medical History:  Diagnosis Date   Atrial fibrillation (HCC)    Colon polyps    Coronary artery disease    GERD (gastroesophageal reflux disease)    Heart murmur    Mitral valve prolapse    Nonrheumatic mitral valve regurgitation    Persistent atrial fibrillation (HCC)    S/P CABG x 1 02/19/2020   LIMA to LAD   S/P Maze operation for atrial fibrillation 02/19/2020   Complete bilateral atrial lesion set using bipolar radiofrequency ablation and cryothermy with clipping of LA appendage via median sternotomy approach   S/P mitral valve repair 02/19/2020   Complex valvuloplasty including artificial Gore-tex neochord placement x8, decalcification of posterior leaflet and posterior annulus with sliding leaflet plasty and 34 mm Sorin Memo 4D ring annuloplasty    ASSESSMENT  Recent Results: The most recent result is correlated with 60 mg per week:  Lab Results  Component Value Date   INR 1.7 (A) 05/12/2020   INR 1.5 (A) 05/06/2020   INR  1.7 (A) 04/23/2020    Anticoagulation Dosing: Description   INR below goal. Increase weekly dose to  10 mg on Wed and Fri and 7.5 mg all other days. Recheck INR in 2 weeks      INR today: Subtherapeutic. INR trending up slightly following boost doses last visit. Total weekly dose last week of 60 mg. Pt denies any missed doses, changes in his medications, increased Vit-K intake. Denies any identifiable causes for the INR decrease. Will consider weekly dose by 15% to 57.5 mg. Denies and s/sx of stroke. Pt is scheduled for an ECHO in July 16th to determine need for continued warfarin therapy. Pt continues to remain in sinus rhythm.  PLAN Weekly  dose was increased by 15%. Increase dose to  10 mg every Wed and Fri and 7.5 mg all other days. Recheck INR in 2 weeks  Patient Instructions  INR below goal. Increase weekly dose to  10 mg on Wed and Fri and 7.5 mg all other days. Recheck INR in 2 weeks  Patient advised to contact clinic or seek medical attention if signs/symptoms of bleeding or thromboembolism occur.  Patient verbalized understanding by repeating back information and was advised to contact me if further medication-related questions arise.   Follow-up Return in about 2 weeks (around 05/26/2020).  Alysia Penna, PharmD  15 minutes spent face-to-face with the patient during the encounter. 50% of time spent on education, including signs/sx bleeding and clotting, as well as food and drug interactions with warfarin. 50% of time was spent on fingerprick POC INR sample collection,processing, results determination, and documentation

## 2020-05-13 MED ORDER — PROPOFOL 10 MG/ML IV BOLUS
INTRAVENOUS | Status: AC
  Filled 2020-05-13: qty 20

## 2020-05-13 MED ORDER — FENTANYL CITRATE (PF) 250 MCG/5ML IJ SOLN
INTRAMUSCULAR | Status: AC
  Filled 2020-05-13: qty 5

## 2020-05-13 MED ORDER — MIDAZOLAM HCL 2 MG/2ML IJ SOLN
INTRAMUSCULAR | Status: AC
  Filled 2020-05-13: qty 2

## 2020-05-14 ENCOUNTER — Encounter (HOSPITAL_COMMUNITY)
Admission: RE | Admit: 2020-05-14 | Discharge: 2020-05-14 | Disposition: A | Discharge status: Home / Self Care | Payer: BC Managed Care – PPO | Source: Ambulatory Visit | Attending: Cardiology | Admitting: Cardiology

## 2020-05-14 ENCOUNTER — Other Ambulatory Visit: Payer: Self-pay

## 2020-05-14 DIAGNOSIS — Z9889 Other specified postprocedural states: Secondary | ICD-10-CM | POA: Diagnosis not present

## 2020-05-14 DIAGNOSIS — Z951 Presence of aortocoronary bypass graft: Secondary | ICD-10-CM | POA: Diagnosis not present

## 2020-05-15 ENCOUNTER — Other Ambulatory Visit: Payer: Self-pay | Admitting: Cardiology

## 2020-05-15 ENCOUNTER — Telehealth (HOSPITAL_COMMUNITY): Payer: Self-pay | Admitting: *Deleted

## 2020-05-15 NOTE — Telephone Encounter (Signed)
-----   Message from Nigel Mormon, MD sent at 05/13/2020  9:09 PM EDT ----- Regarding: RE: Exercise target heart rate increase Ok with me.  Thanks MJP  ----- Message ----- From: Sol Passer Sent: 05/11/2020   1:32 PM EDT To: Nigel Mormon, MD Subject: Exercise target heart rate increase            Greetings Dr. Virgina Jock,   Oletta Darter has been in cardiac rehab approximately 5 weeks and is doing well.  Blood pressure is within normal limits, but exercise heart rates are beginning to exceed Target Heart Rate Range(THRR) of 63-126 bpm (40%-80% of age predicted max HR).  If no GXT is planned for the near future and MD agrees, request to increase THR to 40% -90% of age predicted max HR 63-142 bpm.   Thank you, Sol Passer, MS, ACSM CEP

## 2020-05-17 ENCOUNTER — Encounter (HOSPITAL_COMMUNITY)
Admission: RE | Admit: 2020-05-17 | Discharge: 2020-05-17 | Disposition: A | Discharge status: Home / Self Care | Payer: BC Managed Care – PPO | Source: Ambulatory Visit | Attending: Cardiology | Admitting: Cardiology

## 2020-05-17 ENCOUNTER — Other Ambulatory Visit: Payer: Self-pay

## 2020-05-17 DIAGNOSIS — Z9889 Other specified postprocedural states: Secondary | ICD-10-CM

## 2020-05-17 DIAGNOSIS — Z951 Presence of aortocoronary bypass graft: Secondary | ICD-10-CM | POA: Diagnosis not present

## 2020-05-19 ENCOUNTER — Encounter (HOSPITAL_COMMUNITY)
Admission: RE | Admit: 2020-05-19 | Discharge: 2020-05-19 | Disposition: A | Discharge status: Home / Self Care | Payer: BC Managed Care – PPO | Source: Ambulatory Visit | Attending: Cardiology | Admitting: Cardiology

## 2020-05-19 ENCOUNTER — Other Ambulatory Visit: Payer: Self-pay

## 2020-05-19 DIAGNOSIS — Z951 Presence of aortocoronary bypass graft: Secondary | ICD-10-CM

## 2020-05-19 DIAGNOSIS — Z9889 Other specified postprocedural states: Secondary | ICD-10-CM | POA: Diagnosis not present

## 2020-05-21 ENCOUNTER — Other Ambulatory Visit: Payer: Self-pay

## 2020-05-21 ENCOUNTER — Encounter (HOSPITAL_COMMUNITY)
Admission: RE | Admit: 2020-05-21 | Discharge: 2020-05-21 | Disposition: A | Discharge status: Home / Self Care | Payer: BC Managed Care – PPO | Source: Ambulatory Visit | Attending: Cardiology | Admitting: Cardiology

## 2020-05-21 DIAGNOSIS — Z9889 Other specified postprocedural states: Secondary | ICD-10-CM | POA: Diagnosis not present

## 2020-05-21 DIAGNOSIS — Z951 Presence of aortocoronary bypass graft: Secondary | ICD-10-CM

## 2020-05-22 ENCOUNTER — Other Ambulatory Visit: Payer: Self-pay | Admitting: Cardiology

## 2020-05-22 DIAGNOSIS — I480 Paroxysmal atrial fibrillation: Secondary | ICD-10-CM

## 2020-05-24 ENCOUNTER — Other Ambulatory Visit: Payer: Self-pay

## 2020-05-24 ENCOUNTER — Encounter (HOSPITAL_COMMUNITY)
Admission: RE | Admit: 2020-05-24 | Discharge: 2020-05-24 | Disposition: A | Discharge status: Home / Self Care | Payer: BC Managed Care – PPO | Source: Ambulatory Visit | Attending: Cardiology | Admitting: Cardiology

## 2020-05-24 ENCOUNTER — Encounter: Payer: Self-pay | Admitting: Gastroenterology

## 2020-05-24 DIAGNOSIS — Z951 Presence of aortocoronary bypass graft: Secondary | ICD-10-CM | POA: Diagnosis not present

## 2020-05-24 DIAGNOSIS — Z9889 Other specified postprocedural states: Secondary | ICD-10-CM | POA: Diagnosis not present

## 2020-05-25 ENCOUNTER — Ambulatory Visit: Payer: BC Managed Care – PPO | Admitting: Pharmacist

## 2020-05-25 DIAGNOSIS — Z5181 Encounter for therapeutic drug level monitoring: Secondary | ICD-10-CM | POA: Diagnosis not present

## 2020-05-25 DIAGNOSIS — I4891 Unspecified atrial fibrillation: Secondary | ICD-10-CM | POA: Diagnosis not present

## 2020-05-25 DIAGNOSIS — Z7901 Long term (current) use of anticoagulants: Secondary | ICD-10-CM | POA: Diagnosis not present

## 2020-05-25 LAB — POCT INR: INR: 1.7 — AB (ref 2.0–3.0)

## 2020-05-25 NOTE — Patient Instructions (Signed)
INR below goal. Increase weekly dose to 7.5 mg on Mon and 10 mg all other days. Recheck INR in 2 weeks

## 2020-05-25 NOTE — Progress Notes (Signed)
Anticoagulation Management Daniel Browning is a 64 y.o. male who reports to the clinic for monitoring of warfarin treatment.    Indication: Paroxysmal AFib s/p Maze procedure CHA2DS2 Vasc Score 1, HAS-BLED 0  Duration: 3 months (till June 2021) Supervising physician: [facility] Visit History:  Patient does not report signs/symptoms of bleeding or thromboembolism   Other recent changes: No changes in diet, medications, lifestyle. Pt continues cardiac rehab. Reports to be attending 3x/week. Improved exercise tolerance and reports to be doing really well with the program. Denies any complains of SOB, dyspnea upon exertion.   Anticoagulation Episode Summary    Current INR goal:  2.0-3.0  TTR:  13.0 % (2.7 mo)  Next INR check:  06/08/2020  INR from last check:  1.7 (05/25/2020)  Weekly max warfarin dose:  5 mg  Target end date:    INR check location:    Preferred lab:    Send INR reminders to:     Indications   Atrial fibrillation (HCC) [I48.91] Monitoring for long-term anticoagulant use [Z51.81 Z79.01]       Comments:         No Known Allergies  Current Outpatient Medications:  .  acetaminophen (TYLENOL) 500 MG tablet, Take 2 tablets (1,000 mg total) by mouth every 6 (six) hours., Disp: 30 tablet, Rfl: 0 .  amoxicillin (AMOXIL) 500 MG tablet, Take 1 tablet (500 mg total) by mouth once as needed for up to 1 dose. 1 hour before dental procedure (Patient taking differently: Take 2,000 mg by mouth once as needed. 1 hour before dental procedure), Disp: 4 tablet, Rfl: 4 .  aspirin EC 81 MG EC tablet, Take 1 tablet (81 mg total) by mouth daily., Disp: , Rfl:  .  metoprolol tartrate (LOPRESSOR) 25 MG tablet, TAKE 1/2 TABLET TWICE A DAY, Disp: 30 tablet, Rfl: 1 .  nitroGLYCERIN (NITROSTAT) 0.4 MG SL tablet, Place under the tongue., Disp: , Rfl:  .  omeprazole (PRILOSEC OTC) 20 MG tablet, Take 20 mg by mouth daily., Disp: , Rfl:  .  rosuvastatin (CRESTOR) 10 MG  tablet, Take 1 tablet (10 mg total) by mouth daily., Disp: 90 tablet, Rfl: 3 .  warfarin (COUMADIN) 5 MG tablet, Take 1 tablet by mouth once daily as directed. Refer to most recent coumadin instruction sheet for most accurate dosing instructions., Disp: 90 tablet, Rfl: 1 Past Medical History:  Diagnosis Date  . Atrial fibrillation ([place])   . Colon polyps   . Coronary artery disease   . GERD (gastroesophageal reflux disease)   . Heart murmur   . Mitral valve prolapse   . Nonrheumatic mitral valve regurgitation   . Persistent atrial fibrillation ([place])   . S/P CABG x 1 02/19/2020   LIMA to LAD  . S/P Maze operation for atrial fibrillation 02/19/2020   Complete bilateral atrial lesion set using bipolar radiofrequency ablation and cryothermy with clipping of LA appendage via median sternotomy approach  . S/P mitral valve repair 02/19/2020   Complex valvuloplasty including artificial Gore-tex neochord placement x8, decalcification of posterior leaflet and posterior annulus with sliding leaflet plasty and 34 mm Sorin Memo 4D ring annuloplasty   ASSESSMENT  Recent Results: The most recent result is correlated with 67.5 mg per week:  Lab Results  Component Value Date   INR 1.7 (A) 05/25/2020   INR 1.7 (A) 05/12/2020   INR 1.5 (A) 05/06/2020    Anticoagulation Dosing: Description   INR below goal. Increase weekly dose to  7.5 mg on Mon and 10 mg all other days. Recheck INR in 2 weeks      INR today: Subtherapeutic. Pt continues to have trouble getting his INR to be therapeutic. Weekly warfarin dose have been increased 5-15% over the past several visits with minimal change in pt's INR. Confirmed warfarin dose and color with the pt. Pt reports to being compliant and denies any missed doses. Denies any VitK food intake. Denies any changes in medications, health, or lifestyle. Will continue further weekly dose increase by 17.4% and continue close monitoring. Pt denies any s/sx of stroke. Pt is  scheduled for an ECHO in July 16th to determine need for continued warfarin therapy. Pt continues to remain in sinus rhythm.  PLAN Weekly dose was increased by 17.4%. Increase dose to 7.5 mg every Mon and 10 mg all other days. Recheck INR in 2 weeks  Patient Instructions  INR below goal. Increase weekly dose to 7.5 mg on Mon and 10 mg all other days. Recheck INR in 2 weeks  Patient advised to contact clinic or seek medical attention if signs/symptoms of bleeding or thromboembolism occur.  Patient verbalized understanding by repeating back information and was advised to contact me if further medication-related questions arise.   Follow-up Return in about 2 weeks (around 06/08/2020).  Alysia Penna, PharmD  15 minutes spent face-to-face with the patient during the encounter. 50% of time spent on education, including signs/sx bleeding and clotting, as well as food and drug interactions with warfarin. 50% of time was spent on fingerprick POC INR sample collection,processing, results determination, and documentation

## 2020-05-26 ENCOUNTER — Encounter (HOSPITAL_COMMUNITY): Payer: BC Managed Care – PPO

## 2020-05-28 ENCOUNTER — Other Ambulatory Visit: Payer: Self-pay

## 2020-05-28 ENCOUNTER — Encounter (HOSPITAL_COMMUNITY)
Admission: RE | Admit: 2020-05-28 | Discharge: 2020-05-28 | Disposition: A | Discharge status: Home / Self Care | Payer: BC Managed Care – PPO | Source: Ambulatory Visit | Attending: Cardiology | Admitting: Cardiology

## 2020-05-28 DIAGNOSIS — Z951 Presence of aortocoronary bypass graft: Secondary | ICD-10-CM

## 2020-05-28 DIAGNOSIS — Z9889 Other specified postprocedural states: Secondary | ICD-10-CM | POA: Diagnosis not present

## 2020-05-31 ENCOUNTER — Encounter (HOSPITAL_COMMUNITY)
Admission: RE | Admit: 2020-05-31 | Discharge: 2020-05-31 | Disposition: A | Discharge status: Home / Self Care | Payer: BC Managed Care – PPO | Source: Ambulatory Visit | Attending: Cardiology | Admitting: Cardiology

## 2020-05-31 ENCOUNTER — Other Ambulatory Visit: Payer: Self-pay

## 2020-05-31 DIAGNOSIS — Z9889 Other specified postprocedural states: Secondary | ICD-10-CM | POA: Diagnosis not present

## 2020-05-31 DIAGNOSIS — Z951 Presence of aortocoronary bypass graft: Secondary | ICD-10-CM

## 2020-06-01 NOTE — Progress Notes (Signed)
Cardiac Individual Treatment Plan  Patient Details  Name: Daniel Browning MRN: [id number] Date of Birth: June 18, 1956 Referring Provider:     [place] from 04/01/2020 in [place]  Referring Provider [place], [place] J      Initial Encounter Date:    CARDIAC REHAB PHASE II ORIENTATION from 04/01/2020 in [place]  Date 04/01/20      Visit Diagnosis: S/P CABG x 1 02/19/20  S/P mitral valve repair, Maze procedure 02/19/20  Patient's Home Medications on Admission:  Current Outpatient Medications:  .  acetaminophen (TYLENOL) 500 MG tablet, Take 2 tablets (1,000 mg total) by mouth every 6 (six) hours., Disp: 30 tablet, Rfl: 0 .  amoxicillin (AMOXIL) 500 MG tablet, Take 1 tablet (500 mg total) by mouth once as needed for up to 1 dose. 1 hour before dental procedure (Patient taking differently: Take 2,000 mg by mouth once as needed. 1 hour before dental procedure), Disp: 4 tablet, Rfl: 4 .  aspirin EC 81 MG EC tablet, Take 1 tablet (81 mg total) by mouth daily., Disp: , Rfl:  .  metoprolol tartrate (LOPRESSOR) 25 MG tablet, TAKE 1/2 TABLET TWICE A DAY, Disp: 30 tablet, Rfl: 1 .  nitroGLYCERIN (NITROSTAT) 0.4 MG SL tablet, Place under the tongue., Disp: , Rfl:  .  omeprazole (PRILOSEC OTC) 20 MG tablet, Take 20 mg by mouth daily., Disp: , Rfl:  .  rosuvastatin (CRESTOR) 10 MG tablet, Take 1 tablet (10 mg total) by mouth daily., Disp: 90 tablet, Rfl: 3 .  warfarin (COUMADIN) 5 MG tablet, Take 1 tablet by mouth once daily as directed. Refer to most recent coumadin instruction sheet for most accurate dosing instructions., Disp: 90 tablet, Rfl: 1  Past Medical History: Past Medical History:  Diagnosis Date  . Atrial fibrillation ([place])   . Colon polyps   . Coronary artery disease   . GERD (gastroesophageal reflux disease)   . Heart murmur   . Mitral valve prolapse   . Nonrheumatic mitral valve  regurgitation   . Persistent atrial fibrillation ([place])   . S/P CABG x 1 02/19/2020   LIMA to LAD  . S/P Maze operation for atrial fibrillation 02/19/2020   Complete bilateral atrial lesion set using bipolar radiofrequency ablation and cryothermy with clipping of LA appendage via median sternotomy approach  . S/P mitral valve repair 02/19/2020   Complex valvuloplasty including artificial Gore-tex neochord placement x8, decalcification of posterior leaflet and posterior annulus with sliding leaflet plasty and 34 mm Sorin Memo 4D ring annuloplasty    Tobacco Use: Social History   Tobacco Use  Smoking Status Never Smoker  Smokeless Tobacco Never Used    Labs: Recent Review Flowsheet Data    Labs for ITP Cardiac and Pulmonary Rehab Latest Ref Rng & Units 02/19/2020 02/19/2020 02/19/2020 02/19/2020 02/19/2020   Hemoglobin A1c 4.8 - 5.6 % - - - - -   PHART 7.35 - 7.45 - 7.332(L) 7.292(L) 7.377 7.341(L)   PCO2ART 32 - 48 mmHg - 43.7 46.0 32.7 36.3   HCO3 20.0 - 28.0 mmol/L - 23.1 22.1 19.2(L) 19.6(L)   TCO2 22 - 32 mmol/L 24 24 23  20(L) 21(L)   ACIDBASEDEF 0.0 - 2.0 mmol/L - 3.0(H) 4.0(H) 5.0(H) 6.0(H)   O2SAT % - 100.0 90.0 96.0 97.0      Capillary Blood Glucose: Lab Results  Component Value Date   GLUCAP 116 (H) 02/23/2020   GLUCAP 97 02/23/2020  GLUCAP 139 (H) 02/23/2020   GLUCAP 106 (H) 02/23/2020   GLUCAP 142 (H) 02/22/2020     Exercise Target Goals: Exercise Program Goal: Individual exercise prescription set using results from initial 6 min walk test and THRR while considering  patient's activity barriers and safety.   Exercise Prescription Goal: Initial exercise prescription builds to 30-45 minutes a day of aerobic activity, 2-3 days per week.  Home exercise guidelines will be given to patient during program as part of exercise prescription that the participant will acknowledge.  Activity Barriers & Risk Stratification:  Activity Barriers & Cardiac Risk Stratification -  04/01/20 1038      Activity Barriers & Cardiac Risk Stratification   Activity Barriers None    Cardiac Risk Stratification High           6 Minute Walk:  6 Minute Walk    Row Name 04/01/20 1036 05/28/20 0714       6 Minute Walk   Phase Initial Discharge    Distance 1702 feet 2099 feet    Distance % Change -- 23.33 %    Distance Feet Change -- 397 ft    Walk Time 6 minutes 6 minutes    # of Rest Breaks 0 0    MPH 3.2 3.98    METS 4 4.95    RPE 7 9    Perceived Dyspnea  0 0    VO2 Peak 14.03 17.31    Symptoms No No    Resting HR 91 bpm 81 bpm    Resting BP 104/72 122/72    Resting Oxygen Saturation  100 % --    Exercise Oxygen Saturation  during 6 min walk 100 % --    Max Ex. HR 102 bpm 109 bpm    Max Ex. BP 120/60 142/64    2 Minute Post BP 110/70 110/78           Oxygen Initial Assessment:   Oxygen Re-Evaluation:   Oxygen Discharge (Final Oxygen Re-Evaluation):   Initial Exercise Prescription:  Initial Exercise Prescription - 04/01/20 1000      Date of Initial Exercise RX and Referring Provider   Date 04/01/20    Referring Provider Nigel Mormon    Expected Discharge Date 06/04/20      Treadmill   MPH 2.3    Grade 1    Minutes 15    METs 3.08      NuStep   Level 2    SPM 85    Minutes 15    METs 3      Prescription Details   Frequency (times per week) 3x    Duration Progress to 30 minutes of continuous aerobic without signs/symptoms of physical distress      Intensity   THRR 40-80% of Max Heartrate 63-126    Ratings of Perceived Exertion 11-13    Perceived Dyspnea 0-4      Progression   Progression Continue progressive overload as per policy without signs/symptoms or physical distress.      Resistance Training   Training Prescription Yes    Weight 4lbs    Reps 10-15           Perform Capillary Blood Glucose checks as needed.  Exercise Prescription Changes:   Exercise Prescription Changes    Row Name 04/12/20 0708  04/26/20 0705 05/07/20 0700 05/17/20 0701 05/24/20 0757     Response to Exercise   Blood Pressure (Admit) 102/62 132/78 108/60 126/78 106/68   Blood  Pressure (Exercise) 124/76 110/70 128/64 124/72 122/62   Blood Pressure (Exit) 110/62 124/72 104/62 100/62 126/72   Heart Rate (Admit) 96 bpm 91 bpm 94 bpm 84 bpm 82 bpm   Heart Rate (Exercise) 106 bpm 120 bpm 141 bpm 130 bpm 126 bpm   Heart Rate (Exit) 94 bpm 98 bpm 95 bpm 92 bpm 91 bpm   Rating of Perceived Exertion (Exercise) 7 11 11 12 13    Symptoms none none none none none   Comments Off to a good start with exercise. -- -- -- --   Duration Continue with 30 min of aerobic exercise without signs/symptoms of physical distress. Continue with 30 min of aerobic exercise without signs/symptoms of physical distress. Continue with 30 min of aerobic exercise without signs/symptoms of physical distress. Continue with 30 min of aerobic exercise without signs/symptoms of physical distress. Continue with 30 min of aerobic exercise without signs/symptoms of physical distress.   Intensity THRR unchanged THRR unchanged THRR unchanged THRR New  Recieved ok to increase THRR to 63-142 bpm, pt aware. THRR unchanged     Progression   Progression Continue to progress workloads to maintain intensity without signs/symptoms of physical distress. Continue to progress workloads to maintain intensity without signs/symptoms of physical distress. Continue to progress workloads to maintain intensity without signs/symptoms of physical distress. Continue to progress workloads to maintain intensity without signs/symptoms of physical distress. Continue to progress workloads to maintain intensity without signs/symptoms of physical distress.   Average METs 3.6 5.1 6 6.1 6.2     Resistance Training   Training Prescription Yes Yes Yes Yes Yes   Weight 4lbs 5lbs 6lbs 8lbs 8lbs   Reps 10-15 10-15 10-15 10-15 10-15   Time 10 Minutes 10 Minutes 10 Minutes 10 Minutes 10 Minutes      Interval Training   Interval Training No No No No No     Treadmill   MPH 2.8 3.3 3.5 3.5 3.5   Grade 2 3 4 5 5    Minutes 15 15 15 15 15    METs 3.92 4.89 5.61 6.09 6.09     NuStep   Level 2 5 5 5 5    SPM 85 85 85 85 85   Minutes 15 15 15 15 15    METs 3.3 5.3 6.4 6.2 6.4     Home Exercise Plan   Plans to continue exercise at -- -- Home (comment)  Walking Home (comment)  Walking Home (comment)  Walking   Frequency -- -- Add 4 additional days to program exercise sessions. Add 4 additional days to program exercise sessions. Add 4 additional days to program exercise sessions.   Initial Home Exercises Provided -- -- 04/28/20 04/28/20 04/28/20          Exercise Comments:   Exercise Comments    Row Name 04/12/20 0813 04/28/20 0704 05/11/20 1337 05/12/20 0650 05/17/20 0700   Exercise Comments Patient tolerated first session of exercise well without symptoms. Reviewed home exercise guidelines, METs, and goals with patient. Sent request for THRR increase. Reviewed METs and goals with patient. Received OK from cardiologist to increase exercise THRR. Informed patient of THRR increase to 63-142 bpm.   Row Name 05/28/20 0725           Exercise Comments Reviewed METs and goals with patient.              Exercise Goals and Review:   Exercise Goals    Row Name 04/01/20 1038  Exercise Goals   Increase Physical Activity Yes       Intervention Provide advice, education, support and counseling about physical activity/exercise needs.;Develop an individualized exercise prescription for aerobic and resistive training based on initial evaluation findings, risk stratification, comorbidities and participant's personal goals.       Expected Outcomes Short Term: Attend rehab on a regular basis to increase amount of physical activity.;Long Term: Exercising regularly at least 3-5 days a week.;Long Term: Add in home exercise to make exercise part of routine and to increase amount of  physical activity.       Increase Strength and Stamina Yes       Intervention Provide advice, education, support and counseling about physical activity/exercise needs.;Develop an individualized exercise prescription for aerobic and resistive training based on initial evaluation findings, risk stratification, comorbidities and participant's personal goals.       Expected Outcomes Short Term: Increase workloads from initial exercise prescription for resistance, speed, and METs.;Short Term: Perform resistance training exercises routinely during rehab and add in resistance training at home;Long Term: Improve cardiorespiratory fitness, muscular endurance and strength as measured by increased METs and functional capacity (6MWT)       Able to understand and use rate of perceived exertion (RPE) scale Yes       Intervention Provide education and explanation on how to use RPE scale       Expected Outcomes Short Term: Able to use RPE daily in rehab to express subjective intensity level;Long Term:  Able to use RPE to guide intensity level when exercising independently       Knowledge and understanding of Target Heart Rate Range (THRR) Yes       Intervention Provide education and explanation of THRR including how the numbers were predicted and where they are located for reference       Expected Outcomes Short Term: Able to state/look up THRR;Long Term: Able to use THRR to govern intensity when exercising independently;Short Term: Able to use daily as guideline for intensity in rehab       Able to check pulse independently Yes       Intervention Provide education and demonstration on how to check pulse in carotid and radial arteries.;Review the importance of being able to check your own pulse for safety during independent exercise       Expected Outcomes Short Term: Able to explain why pulse checking is important during independent exercise;Long Term: Able to check pulse independently and accurately        Understanding of Exercise Prescription Yes       Intervention Provide education, explanation, and written materials on patient's individual exercise prescription       Expected Outcomes Short Term: Able to explain program exercise prescription;Long Term: Able to explain home exercise prescription to exercise independently              Exercise Goals Re-Evaluation :  Exercise Goals Re-Evaluation    Row Name 04/12/20 0813 04/28/20 0704 05/12/20 0650 05/28/20 0725       Exercise Goal Re-Evaluation   Exercise Goals Review Increase Physical Activity;Able to understand and use rate of perceived exertion (RPE) scale Increase Physical Activity;Able to understand and use rate of perceived exertion (RPE) scale;Increase Strength and Stamina;Knowledge and understanding of Target Heart Rate Range (THRR);Understanding of Exercise Prescription Increase Physical Activity;Able to understand and use rate of perceived exertion (RPE) scale;Increase Strength and Stamina;Knowledge and understanding of Target Heart Rate Range (THRR);Understanding of Exercise Prescription Increase Physical Activity;Able to understand  and use rate of perceived exertion (RPE) scale;Increase Strength and Stamina;Knowledge and understanding of Target Heart Rate Range (THRR);Understanding of Exercise Prescription    Comments Patient able to understand and use RPE scale appropriately. Reviewed home exercise guidelines with patient including endpoints, temperature precautions, target heart rate and rate of perceived exertion. Pt is walking 60 minutes on the days he doesn't attend cardiac rehab and at 30 minutes on the days he does attend cardiac rehab as his mode of home exercise. Patient has 2,5, and 8 lb weights at home that he can use for his resistance training. Patient not currently doing weights at home. Instructed patient on manual pulse counting, handout given. Pt voices understanding of instructions given. Patient is doing well with his  exercise. Patient is walking daily and has no concerns at this time. Increasing workloads appropriately. Increased hand weights from 5 to 6lbs. Patient states he's been cleared to resume golf 05/21/20, which was one of his goals. Patient's functional capacity increased 23% as measured by the 6MWT, strength increased 11%, and flexibility increased 16%. Patient has progressed well achieving 6.2 METs with exercise. Patient feels he's achived his personal goals. Patient is walking at least 60 minutes daily and has 2, 6, and 8 lb hand weights at home. Pt hasn't start resistance training at home but plans to upon graduation 2-4 days/week. Patient states he's been cleared to resume his goal activiites golf and resume running.    Expected Outcomes Increase workloads as tolerated to help increase strength and stamina. Patient will continue daily exercise routine to help achieve personal health and fitness goals. Patient will continue current daily exercise routine. Patient will continue current daily exercise routine and will add resistance training at home upon completion of the program.           Discharge Exercise Prescription (Final Exercise Prescription Changes):  Exercise Prescription Changes - 05/24/20 0757      Response to Exercise   Blood Pressure (Admit) 106/68    Blood Pressure (Exercise) 122/62    Blood Pressure (Exit) 126/72    Heart Rate (Admit) 82 bpm    Heart Rate (Exercise) 126 bpm    Heart Rate (Exit) 91 bpm    Rating of Perceived Exertion (Exercise) 13    Symptoms none    Duration Continue with 30 min of aerobic exercise without signs/symptoms of physical distress.    Intensity THRR unchanged      Progression   Progression Continue to progress workloads to maintain intensity without signs/symptoms of physical distress.    Average METs 6.2      Resistance Training   Training Prescription Yes    Weight 8lbs    Reps 10-15    Time 10 Minutes      Interval Training   Interval  Training No      Treadmill   MPH 3.5    Grade 5    Minutes 15    METs 6.09      NuStep   Level 5    SPM 85    Minutes 15    METs 6.4      Home Exercise Plan   Plans to continue exercise at Home (comment)   Walking   Frequency Add 4 additional days to program exercise sessions.    Initial Home Exercises Provided 04/28/20           Nutrition:  Target Goals: Understanding of nutrition guidelines, daily intake of sodium <1574m, cholesterol <2023m calories 30% from fat and  7% or less from saturated fats, daily to have 5 or more servings of fruits and vegetables.  Biometrics:  Pre Biometrics - 04/01/20 1038      Pre Biometrics   Height 5' 9.75" (1.772 m)    Weight 86.8 kg    Waist Circumference 40 inches    Hip Circumference 42 inches    Waist to Hip Ratio 0.95 %    BMI (Calculated) 27.64    Triceps Skinfold 12 mm    % Body Fat 37 %    Grip Strength 42 kg    Flexibility 12.5 in    Single Leg Stand 94 seconds            Nutrition Therapy Plan and Nutrition Goals:  Nutrition Therapy & Goals - 04/16/20 0818      Nutrition Therapy   Diet Heart Healthy      Personal Nutrition Goals   Nutrition Goal Pt to build a healthy plate including vegetables, fruits, whole grains, and low-fat dairy products in a heart healthy meal plan.    Personal Goal #2 Pt to increase fiber intake by 5 g daily by incorporating vegetables and whole grains for goal of 25 g/day      Intervention Plan   Intervention Nutrition handout(s) given to patient.;Prescribe, educate and counsel regarding individualized specific dietary modifications aiming towards targeted core components such as weight, hypertension, lipid management, diabetes, heart failure and other comorbidities.    Expected Outcomes Short Term Goal: A plan has been developed with personal nutrition goals set during dietitian appointment.;Long Term Goal: Adherence to prescribed nutrition plan.           Nutrition Assessments:   Nutrition Assessments - 04/15/20 0833      MEDFICTS Scores   Pre Score 107           Nutrition Goals Re-Evaluation:  Nutrition Goals Re-Evaluation    [place] Name 04/16/20 0820 06/03/20 0730           Goals   Current Weight 191 lb (86.6 kg) 192 lb 7.4 oz (87.3 kg)      Nutrition Goal Pt to build a healthy plate including vegetables, fruits, whole grains, and low-fat dairy products in a heart healthy meal plan. Pt to build a healthy plate including vegetables, fruits, whole grains, and low-fat dairy products in a heart healthy meal plan.      Comment -- Pt increased fiber by switching to whole grains and choosing veggies/fruits everyday. He limited processed/red meats as well.      Expected Outcome Adequate fiber intake and increased nutrient density Adequate fiber intake and increased nutrient density        Personal Goal #2 Re-Evaluation   Personal Goal #2 Pt to increase fiber intake by 5 g daily by incorporating vegetables and whole grains for goal of 25 g/day Pt to increase fiber intake by 5 g daily by incorporating vegetables and whole grains for goal of 25 g/day             Nutrition Goals Re-Evaluation:  Nutrition Goals Re-Evaluation    [place] Name 04/16/20 0820 06/03/20 0730           Goals   Current Weight 191 lb (86.6 kg) 192 lb 7.4 oz (87.3 kg)      Nutrition Goal Pt to build a healthy plate including vegetables, fruits, whole grains, and low-fat dairy products in a heart healthy meal plan. Pt to build a healthy plate including vegetables, fruits, whole grains, and  low-fat dairy products in a heart healthy meal plan.      Comment -- Pt increased fiber by switching to whole grains and choosing veggies/fruits everyday. He limited processed/red meats as well.      Expected Outcome Adequate fiber intake and increased nutrient density Adequate fiber intake and increased nutrient density        Personal Goal #2 Re-Evaluation   Personal Goal #2 Pt to increase fiber intake by 5  g daily by incorporating vegetables and whole grains for goal of 25 g/day Pt to increase fiber intake by 5 g daily by incorporating vegetables and whole grains for goal of 25 g/day             Nutrition Goals Discharge (Final Nutrition Goals Re-Evaluation):  Nutrition Goals Re-Evaluation - 06/03/20 0730      Goals   Current Weight 192 lb 7.4 oz (87.3 kg)    Nutrition Goal Pt to build a healthy plate including vegetables, fruits, whole grains, and low-fat dairy products in a heart healthy meal plan.    Comment Pt increased fiber by switching to whole grains and choosing veggies/fruits everyday. He limited processed/red meats as well.    Expected Outcome Adequate fiber intake and increased nutrient density      Personal Goal #2 Re-Evaluation   Personal Goal #2 Pt to increase fiber intake by 5 g daily by incorporating vegetables and whole grains for goal of 25 g/day           Psychosocial: Target Goals: Acknowledge presence or absence of significant depression and/or stress, maximize coping skills, provide positive support system. Participant is able to verbalize types and ability to use techniques and skills needed for reducing stress and depression.  Initial Review & Psychosocial Screening:  Initial Psych Review & Screening - 04/01/20 1138      Initial Review   Current issues with None Identified      Family Dynamics   Good Support System? Yes   Nasario has his wife for support     Barriers   Psychosocial barriers to participate in program There are no identifiable barriers or psychosocial needs.      Screening Interventions   Interventions Encouraged to exercise           Quality of Life Scores:  Quality of Life - 05/31/20 1557      Quality of Life   Select Quality of Life      Quality of Life Scores   Health/Function Pre 22.63 %    Health/Function Post 25.43 %    Health/Function % Change 12.37 %    Socioeconomic Pre 25.21 %    Socioeconomic Post 23.86 %     Socioeconomic % Change  -5.36 %    Psych/Spiritual Pre 25.21 %    Psych/Spiritual Post 24 %    Psych/Spiritual % Change -4.8 %    Family Pre 22.6 %    Family Post 27.6 %    Family % Change 22.12 %    GLOBAL Pre 23.79 %    GLOBAL Post 25.13 %    GLOBAL % Change 5.63 %          Scores of 19 and below usually indicate a poorer quality of life in these areas.  A difference of  2-3 points is a clinically meaningful difference.  A difference of 2-3 points in the total score of the Quality of Life Index has been associated with significant improvement in overall quality of life, self-image, physical  symptoms, and general health in studies assessing change in quality of life.  PHQ-9: Recent Review Flowsheet Data    Depression screen [facility] 2/9 04/01/2020   Decreased Interest 0   Down, Depressed, Hopeless 0   PHQ - 2 Score 0     Interpretation of Total Score  Total Score Depression Severity:  1-4 = Minimal depression, 5-9 = Mild depression, 10-14 = Moderate depression, 15-19 = Moderately severe depression, 20-27 = Severe depression   Psychosocial Evaluation and Intervention:  Psychosocial Evaluation - 04/12/20 1130      Psychosocial Evaluation & Interventions   Interventions Encouraged to exercise with the program and follow exercise prescription    Comments Mr. Hangartner denies psychosocial barriers to participation in CR and self health management. He has a positive outlook and attitude and eager to return to work for [employer]. He enjoys playing tennis and golf and doing "handyman tasks" around the home. He has a strong support system. No psychosocial interventions needed.    Expected Outcomes Patient will continue to maintain a positive attitude and outlook. He will utilize his support system when needed. He will continue to enjoy his hobbies for stress reduction.    Continue Psychosocial Services  No Follow up required           Psychosocial Re-Evaluation:  Psychosocial Re-Evaluation     [place] Name 05/12/20 6256 06/01/20 1127           Psychosocial Re-Evaluation   Current issues with None Identified None Identified      Comments Mr. Albee continues to maintain a positive attitude and outlook. He continues to endorse a strong support system including family and friends. He denies s/s of depression or need for psychosocial interventions at this time. Mr. Trowbridge continues to maintain a positive attitude and outlook. He continues to endorse a strong support system including family and friends. He denies s/s of depression or need for psychosocial interventions at this time.      Expected Outcomes Mr. Treadway will continue to maintain a positive attitude and outlook. He will utilize his support system as needed. Mr. Chason will continue to maintain a positive attitude and outlook. He will utilize his support system as needed.      Interventions Encouraged to attend Cardiac Rehabilitation for the exercise Encouraged to attend Cardiac Rehabilitation for the exercise      Continue Psychosocial Services  No Follow up required No Follow up required             Psychosocial Discharge (Final Psychosocial Re-Evaluation):  Psychosocial Re-Evaluation - 06/01/20 1127      Psychosocial Re-Evaluation   Current issues with None Identified    Comments Mr. Shaneyfelt continues to maintain a positive attitude and outlook. He continues to endorse a strong support system including family and friends. He denies s/s of depression or need for psychosocial interventions at this time.    Expected Outcomes Mr. Tullis will continue to maintain a positive attitude and outlook. He will utilize his support system as needed.    Interventions Encouraged to attend Cardiac Rehabilitation for the exercise    Continue Psychosocial Services  No Follow up required           Vocational Rehabilitation: Provide vocational rehab assistance to qualifying candidates.   Vocational Rehab Evaluation & Intervention:   Vocational Rehab - 04/01/20 1139      Initial Vocational Rehab Evaluation & Intervention   Assessment shows need for Vocational Rehabilitation No  Education: Education Goals: Education classes will be provided on a weekly basis, covering required topics. Participant will state understanding/return demonstration of topics presented.  Learning Barriers/Preferences:  Learning Barriers/Preferences - 04/01/20 1040      Learning Barriers/Preferences   Learning Barriers Sight    Learning Preferences Written Material;Skilled Demonstration;Individual Instruction           Education Topics: Count Your Pulse:  -Group instruction provided by verbal instruction, demonstration, patient participation and written materials to support subject.  Instructors address importance of being able to find your pulse and how to count your pulse when at home without a heart monitor.  Patients get hands on experience counting their pulse with staff help and individually.   Heart Attack, Angina, and Risk Factor Modification:  -Group instruction provided by verbal instruction, video, and written materials to support subject.  Instructors address signs and symptoms of angina and heart attacks.    Also discuss risk factors for heart disease and how to make changes to improve heart health risk factors.   Functional Fitness:  -Group instruction provided by verbal instruction, demonstration, patient participation, and written materials to support subject.  Instructors address safety measures for doing things around the house.  Discuss how to get up and down off the floor, how to pick things up properly, how to safely get out of a chair without assistance, and balance training.   Meditation and Mindfulness:  -Group instruction provided by verbal instruction, patient participation, and written materials to support subject.  Instructor addresses importance of mindfulness and meditation practice to help reduce  stress and improve awareness.  Instructor also leads participants through a meditation exercise.    Stretching for Flexibility and Mobility:  -Group instruction provided by verbal instruction, patient participation, and written materials to support subject.  Instructors lead participants through series of stretches that are designed to increase flexibility thus improving mobility.  These stretches are additional exercise for major muscle groups that are typically performed during regular warm up and cool down.   Hands Only CPR:  -Group verbal, video, and participation provides a basic overview of AHA guidelines for community CPR. Role-play of emergencies allow participants the opportunity to practice calling for help and chest compression technique with discussion of AED use.   Hypertension: -Group verbal and written instruction that provides a basic overview of hypertension including the most recent diagnostic guidelines, risk factor reduction with self-care instructions and medication management.    Nutrition I class: Heart Healthy Eating:  -Group instruction provided by PowerPoint slides, verbal discussion, and written materials to support subject matter. The instructor gives an explanation and review of the Therapeutic Lifestyle Changes diet recommendations, which includes a discussion on lipid goals, dietary fat, sodium, fiber, plant stanol/sterol esters, sugar, and the components of a well-balanced, healthy diet.   Nutrition II class: Lifestyle Skills:  -Group instruction provided by PowerPoint slides, verbal discussion, and written materials to support subject matter. The instructor gives an explanation and review of label reading, grocery shopping for heart health, heart healthy recipe modifications, and ways to make healthier choices when eating out.   Diabetes Question & Answer:  -Group instruction provided by PowerPoint slides, verbal discussion, and written materials to support  subject matter. The instructor gives an explanation and review of diabetes co-morbidities, pre- and post-prandial blood glucose goals, pre-exercise blood glucose goals, signs, symptoms, and treatment of hypoglycemia and hyperglycemia, and foot care basics.   Diabetes Blitz:  -Group instruction provided by [employer], verbal discussion, and written  materials to support subject matter. The instructor gives an explanation and review of the physiology behind type 1 and type 2 diabetes, diabetes medications and rational behind using different medications, pre- and post-prandial blood glucose recommendations and Hemoglobin A1c goals, diabetes diet, and exercise including blood glucose guidelines for exercising safely.    Portion Distortion:  -Group instruction provided by PowerPoint slides, verbal discussion, written materials, and food models to support subject matter. The instructor gives an explanation of serving size versus portion size, changes in portions sizes over the last 20 years, and what consists of a serving from each food group.   Stress Management:  -Group instruction provided by verbal instruction, video, and written materials to support subject matter.  Instructors review role of stress in heart disease and how to cope with stress positively.     Exercising on Your Own:  -Group instruction provided by verbal instruction, power point, and written materials to support subject.  Instructors discuss benefits of exercise, components of exercise, frequency and intensity of exercise, and end points for exercise.  Also discuss use of nitroglycerin and activating EMS.  Review options of places to exercise outside of rehab.  Review guidelines for sex with heart disease.   Cardiac Drugs I:  -Group instruction provided by verbal instruction and written materials to support subject.  Instructor reviews cardiac drug classes: antiplatelets, anticoagulants, beta blockers, and statins.   Instructor discusses reasons, side effects, and lifestyle considerations for each drug class.   Cardiac Drugs II:  -Group instruction provided by verbal instruction and written materials to support subject.  Instructor reviews cardiac drug classes: angiotensin converting enzyme inhibitors (ACE-I), angiotensin II receptor blockers (ARBs), nitrates, and calcium channel blockers.  Instructor discusses reasons, side effects, and lifestyle considerations for each drug class.   Anatomy and Physiology of the Circulatory System:  Group verbal and written instruction and models provide basic cardiac anatomy and physiology, with the coronary electrical and arterial systems. Review of: AMI, Angina, Valve disease, Heart Failure, Peripheral Artery Disease, Cardiac Arrhythmia, Pacemakers, and the ICD.   Other Education:  -Group or individual verbal, written, or video instructions that support the educational goals of the cardiac rehab program.   Holiday Eating Survival Tips:  -Group instruction provided by PowerPoint slides, verbal discussion, and written materials to support subject matter. The instructor gives patients tips, tricks, and techniques to help them not only survive but enjoy the holidays despite the onslaught of food that accompanies the holidays.   Knowledge Questionnaire Score:  Knowledge Questionnaire Score - 05/31/20 1557      Knowledge Questionnaire Score   Pre Score 21/24    Post Score 23/24           Core Components/Risk Factors/Patient Goals at Admission:  Personal Goals and Risk Factors at Admission - 04/01/20 1139      Core Components/Risk Factors/Patient Goals on Admission    Weight Management Yes;Weight Maintenance    Intervention Weight Management: Develop a combined nutrition and exercise program designed to reach desired caloric intake, while maintaining appropriate intake of nutrient and fiber, sodium and fats, and appropriate energy expenditure required for the  weight goal.;Weight Management: Provide education and appropriate resources to help participant work on and attain dietary goals.    Expected Outcomes Weight Maintenance: Understanding of the daily nutrition guidelines, which includes 25-35% calories from fat, 7% or less cal from saturated fats, less than 273m cholesterol, less than 1.5gm of sodium, & 5 or more servings of fruits and vegetables daily;Understanding recommendations  for meals to include 15-35% energy as protein, 25-35% energy from fat, 35-60% energy from carbohydrates, less than 287m of dietary cholesterol, 20-35 gm of total fiber daily;Understanding of distribution of calorie intake throughout the day with the consumption of 4-5 meals/snacks           Core Components/Risk Factors/Patient Goals Review:   Goals and Risk Factor Review    Row Name 04/12/20 1133 05/12/20 1008 06/01/20 1128         Core Components/Risk Factors/Patient Goals Review   Personal Goals Review Weight Management/Obesity;Lipids;Hypertension Weight Management/Obesity;Lipids;Hypertension --     Review Mr. FKurekhas multiple CAD risk factors. He is currently walking 2 hours a day and eager to participate in CR for education and risk factor modification. His goals are to have the stamina and strength to return to jogging for exercise and hopes to be able to continue to play golf and jog for time to come. Mr. FZiomekhas multiple CAD risk factors. He is currently walking at home and eager to continue participation in CR for education and risk factor modification. His goals are to have the stamina and strength to return to jogging for exercise and hopes to be able to continue to play golf and jog for time to come. He currently feels he is ready to begin a jogging program and will work with EP for goal setting. He continues to take all medications as prescribe and his BP is well within goal parameters. Mr. FHesslinghas multiple CAD risk factors. He is currently walking at  home and eager to continue participation in CR for education and risk factor modification. His goals are to have the stamina and strength to return to jogging for exercise and hopes to be able to continue to play golf and jog for time to come. He currently feels he is ready to begin a jogging program and has worked with EP for goal setting. He continues to take all medications as prescribe and his BP is well within goal parameters.     Expected Outcomes Paitent will continue to participate in CR for risk factor modification Paitent will continue to participate in CR for risk factor modification Paitent will continue to participate in CR for risk factor modification            Core Components/Risk Factors/Patient Goals at Discharge (Final Review):   Goals and Risk Factor Review - 06/01/20 1128      Core Components/Risk Factors/Patient Goals Review   Review Mr. FZelmanhas multiple CAD risk factors. He is currently walking at home and eager to continue participation in CR for education and risk factor modification. His goals are to have the stamina and strength to return to jogging for exercise and hopes to be able to continue to play golf and jog for time to come. He currently feels he is ready to begin a jogging program and has worked with EP for goal setting. He continues to take all medications as prescribe and his BP is well within goal parameters.    Expected Outcomes Paitent will continue to participate in CR for risk factor modification           ITP Comments:  ITP Comments    Row Name 04/01/20 1035 04/01/20 1135 04/12/20 1128 05/12/20 0947 06/01/20 1124   ITP Comments Dr. TFransico Him Medical Director Dr. TFransico HimMD, Medical Director 30 day ITP review: Today, Mr. FFahrcompleted his first cardiac rehab exercise session and tolerated well. VSS. Denied complaints.  Reported RPE of 7 on NuStep and Treadmill at prescribed workloads. 30 day ITP review: Mr. Choinski continues to do extremely  well in his cardiac rehab exercise sessions. He is self motivated and initiates workload increases appropriately. Today his average mets were 6.6 on the NuStep. He feels he is gaining even more strength that he had prior to his cardiac surgery. He feels he has increase his leg strength and ready to being a running program. He denies any cardiac complaints and his VS remain well within limits for exercise. He is encouraged by how well and fit he feels this far into the CR program. 30 day ITP review: Mr. Schlabach continues to do extremely well in his cardiac rehab exercise sessions. He is self motivated and initiates workload increases appropriately. His average mets are 6.8. He feels he is gaining even more strength that he had prior to his cardiac surgery. He feels he has increase his leg strength and ready to being a running program. He denies any cardiac complaints and his VS remain well within limits for exercise. He is encouraged by how well and fit he feels. He will graduate from Cardiac Rehab 06/04/20 with goals met.          Comments: see ITP comments

## 2020-06-02 ENCOUNTER — Other Ambulatory Visit: Payer: Self-pay

## 2020-06-02 ENCOUNTER — Encounter (HOSPITAL_COMMUNITY)
Admission: RE | Admit: 2020-06-02 | Discharge: 2020-06-02 | Disposition: A | Discharge status: Home / Self Care | Payer: BC Managed Care – PPO | Source: Ambulatory Visit | Attending: Cardiology | Admitting: Cardiology

## 2020-06-02 DIAGNOSIS — Z951 Presence of aortocoronary bypass graft: Secondary | ICD-10-CM | POA: Diagnosis not present

## 2020-06-02 DIAGNOSIS — Z9889 Other specified postprocedural states: Secondary | ICD-10-CM

## 2020-06-04 ENCOUNTER — Other Ambulatory Visit: Payer: Self-pay

## 2020-06-04 ENCOUNTER — Encounter (HOSPITAL_COMMUNITY)
Admission: RE | Admit: 2020-06-04 | Discharge: 2020-06-04 | Disposition: A | Discharge status: Home / Self Care | Payer: BC Managed Care – PPO | Source: Ambulatory Visit | Attending: Cardiology | Admitting: Cardiology

## 2020-06-04 VITALS — BP 108/80 | HR 83 | Ht 69.75 in | Wt 191.6 lb

## 2020-06-04 DIAGNOSIS — Z951 Presence of aortocoronary bypass graft: Secondary | ICD-10-CM

## 2020-06-04 DIAGNOSIS — Z9889 Other specified postprocedural states: Secondary | ICD-10-CM

## 2020-06-08 ENCOUNTER — Ambulatory Visit: Payer: BC Managed Care – PPO | Admitting: Pharmacist

## 2020-06-08 ENCOUNTER — Other Ambulatory Visit: Payer: Self-pay

## 2020-06-08 DIAGNOSIS — Z7901 Long term (current) use of anticoagulants: Secondary | ICD-10-CM | POA: Diagnosis not present

## 2020-06-08 DIAGNOSIS — I4891 Unspecified atrial fibrillation: Secondary | ICD-10-CM

## 2020-06-08 DIAGNOSIS — Z5181 Encounter for therapeutic drug level monitoring: Secondary | ICD-10-CM | POA: Diagnosis not present

## 2020-06-08 LAB — POCT INR: INR: 2.3 (ref 2.0–3.0)

## 2020-06-08 NOTE — Progress Notes (Signed)
Anticoagulation Management Daniel Browning is a 64 y.o. male who reports to the clinic for monitoring of warfarin treatment.    Indication: Paroxysmal AFib s/p Maze procedure CHA2DS2 Vasc Score 1, HAS-BLED 0  Duration: 3 months (till June 2021) Supervising physician: [facility] Visit History:  Patient does not report signs/symptoms of bleeding or thromboembolism   Other recent changes: No changes in diet, medications, lifestyle. Pt finished his cardiac rehab and reports to be doing well since. Continues to exercise with walking/running at home. Reports to be tolerating the exercise regimen w/o any reported SOB or dyspnea. Did complain of one episode of lightheadedness when he bend over at work. Symptoms lasted for a 1-2 minutes. Symptom self-limiting and pt reports no recurrence since. Denies any palpitations or chest pain. Pt will continue to monitor symptoms moving forward.   Anticoagulation Episode Summary    Current INR goal:  2.0-3.0  TTR:  18.6 % (3.1 mo)  Next INR check:  06/29/2020  INR from last check:  2.3 (06/08/2020)  Weekly max warfarin dose:  5 mg  Target end date:    INR check location:    Preferred lab:    Send INR reminders to:     Indications   Atrial fibrillation (HCC) [I48.91] Monitoring for long-term anticoagulant use [Z51.81 Z79.01]       Comments:         No Known Allergies  Current Outpatient Medications:  .  acetaminophen (TYLENOL) 500 MG tablet, Take 2 tablets (1,000 mg total) by mouth every 6 (six) hours., Disp: 30 tablet, Rfl: 0 .  amoxicillin (AMOXIL) 500 MG tablet, Take 1 tablet (500 mg total) by mouth once as needed for up to 1 dose. 1 hour before dental procedure (Patient taking differently: Take 2,000 mg by mouth once as needed. 1 hour before dental procedure), Disp: 4 tablet, Rfl: 4 .  aspirin EC 81 MG EC tablet, Take 1 tablet (81 mg total) by mouth daily., Disp: , Rfl:  .  metoprolol tartrate (LOPRESSOR) 25 MG  tablet, TAKE 1/2 TABLET TWICE A DAY, Disp: 30 tablet, Rfl: 1 .  nitroGLYCERIN (NITROSTAT) 0.4 MG SL tablet, Place under the tongue., Disp: , Rfl:  .  omeprazole (PRILOSEC OTC) 20 MG tablet, Take 20 mg by mouth daily., Disp: , Rfl:  .  rosuvastatin (CRESTOR) 10 MG tablet, Take 1 tablet (10 mg total) by mouth daily., Disp: 90 tablet, Rfl: 3 .  warfarin (COUMADIN) 5 MG tablet, Take 1 tablet by mouth once daily as directed. Refer to most recent coumadin instruction sheet for most accurate dosing instructions., Disp: 90 tablet, Rfl: 1 Past Medical History:  Diagnosis Date  . Atrial fibrillation ([place])   . Colon polyps   . Coronary artery disease   . GERD (gastroesophageal reflux disease)   . Heart murmur   . Mitral valve prolapse   . Nonrheumatic mitral valve regurgitation   . Persistent atrial fibrillation ([place])   . S/P CABG x 1 02/19/2020   LIMA to LAD  . S/P Maze operation for atrial fibrillation 02/19/2020   Complete bilateral atrial lesion set using bipolar radiofrequency ablation and cryothermy with clipping of LA appendage via median sternotomy approach  . S/P mitral valve repair 02/19/2020   Complex valvuloplasty including artificial Gore-tex neochord placement x8, decalcification of posterior leaflet and posterior annulus with sliding leaflet plasty and 34 mm Sorin Memo 4D ring annuloplasty   ASSESSMENT  Recent Results: The most recent result is correlated with 67.5  mg per week:  Lab Results  Component Value Date   INR 2.3 06/08/2020   INR 1.7 (A) 05/25/2020   INR 1.7 (A) 05/12/2020    Anticoagulation Dosing: Description   INR at  goal. Continue weekly dose of 7.5 mg on Mon and 10 mg all other days. Recheck INR in 3 weeks      INR today: Therapeutic following dose increasing last week. Pt is scheduled for a 3 month ECHO f/u on 7/16. Pt to be evaluated for continued anticoagulation therapy based on ECHO results. 3 wk follow up scheduled in case pt needs continued  anticoagulation therapy. Will discontinue from service if pt is cleared based on ECHO results.   PLAN Weekly dose was unchanged. Continue taking 7.5 mg every Mon and 10 mg all other days. Recheck INR in 3 weeks  Patient Instructions  INR at  goal. Continue weekly dose of 7.5 mg on Mon and 10 mg all other days. Recheck INR in 3 weeks  Patient advised to contact clinic or seek medical attention if signs/symptoms of bleeding or thromboembolism occur.  Patient verbalized understanding by repeating back information and was advised to contact me if further medication-related questions arise.   Follow-up Return in about 3 weeks (around 06/29/2020).  Alysia Penna, PharmD  15 minutes spent face-to-face with the patient during the encounter. 50% of time spent on education, including signs/sx bleeding and clotting, as well as food and drug interactions with warfarin. 50% of time was spent on fingerprick POC INR sample collection,processing, results determination, and documentation

## 2020-06-08 NOTE — Patient Instructions (Signed)
INR at  goal. Continue weekly dose of 7.5 mg on Mon and 10 mg all other days. Recheck INR in 3 weeks

## 2020-06-11 ENCOUNTER — Encounter (HOSPITAL_COMMUNITY): Payer: Self-pay

## 2020-06-11 DIAGNOSIS — Z951 Presence of aortocoronary bypass graft: Secondary | ICD-10-CM

## 2020-06-11 DIAGNOSIS — Z9889 Other specified postprocedural states: Secondary | ICD-10-CM

## 2020-06-11 NOTE — Progress Notes (Signed)
Discharge Progress Report  Patient Details  Name: Daniel Browning MRN: [id number] Date of Birth: 1956-06-16 Referring Provider:     [place] from 04/01/2020 in [place]  Referring Provider Nigel Mormon       Number of Visits: 22  Reason for Discharge:  Patient reached a stable level of exercise. Patient independent in their exercise. Patient has met program and personal goals.  Smoking History:  Social History   Tobacco Use  Smoking Status Never Smoker  Smokeless Tobacco Never Used    Diagnosis:  S/P CABG x 1 02/19/20  S/P mitral valve repair, Maze procedure 02/19/20  ADL [facility]:   Initial Exercise Prescription:  Initial Exercise Prescription - 04/01/20 1000      Date of Initial Exercise RX and Referring Provider   Date 04/01/20    Referring Provider Vernell Leep J    Expected Discharge Date 06/04/20      Treadmill   MPH 2.3    Grade 1    Minutes 15    METs 3.08      NuStep   Level 2    SPM 85    Minutes 15    METs 3      Prescription Details   Frequency (times per week) 3x    Duration Progress to 30 minutes of continuous aerobic without signs/symptoms of physical distress      Intensity   THRR 40-80% of Max Heartrate 63-126    Ratings of Perceived Exertion 11-13    Perceived Dyspnea 0-4      Progression   Progression Continue progressive overload as per policy without signs/symptoms or physical distress.      Resistance Training   Training Prescription Yes    Weight 4lbs    Reps 10-15           Discharge Exercise Prescription (Final Exercise Prescription Changes):  Exercise Prescription Changes - 06/04/20 0700      Response to Exercise   Blood Pressure (Admit) 108/80    Blood Pressure (Exercise) 122/52    Blood Pressure (Exit) 108/58    Heart Rate (Admit) 83 bpm    Heart Rate (Exercise) 121 bpm    Heart Rate (Exit) 83 bpm    Rating of Perceived Exertion (Exercise) 12     Symptoms none    Comments Patient completed the phase 2 cardiac rehab program.    Duration Continue with 30 min of aerobic exercise without signs/symptoms of physical distress.    Intensity THRR unchanged      Progression   Progression Continue to progress workloads to maintain intensity without signs/symptoms of physical distress.    Average METs 6.4      Resistance Training   Training Prescription Yes    Weight 8lbs    Reps 10-15    Time 10 Minutes      Interval Training   Interval Training No      Treadmill   MPH 3.6    Grade 5    Minutes 15    METs 6.24      NuStep   Level 5    SPM 85    Minutes 15    METs 6.6      Home Exercise Plan   Plans to continue exercise at Home (comment)   Walking   Frequency Add 4 additional days to program exercise sessions.    Initial Home Exercises Provided 04/28/20  Functional Capacity:  6 Minute Walk    Row Name 04/01/20 1036 05/28/20 0714       6 Minute Walk   Phase Initial Discharge    Distance 1702 feet 2099 feet    Distance % Change -- 23.33 %    Distance Feet Change -- 397 ft    Walk Time 6 minutes 6 minutes    # of Rest Breaks 0 0    MPH 3.2 3.98    METS 4 4.95    RPE 7 9    Perceived Dyspnea  0 0    VO2 Peak 14.03 17.31    Symptoms No No    Resting HR 91 bpm 81 bpm    Resting BP 104/72 122/72    Resting Oxygen Saturation  100 % --    Exercise Oxygen Saturation  during 6 min walk 100 % --    Max Ex. HR 102 bpm 109 bpm    Max Ex. BP 120/60 142/64    2 Minute Post BP 110/70 110/78           Psychological, QOL, Others - Outcomes: PHQ 2/9: Depression screen PHQ 2/9 04/01/2020  Decreased Interest 0  Down, Depressed, Hopeless 0  PHQ - 2 Score 0    Quality of Life:  Quality of Life - 05/31/20 1557      Quality of Life   Select Quality of Life      Quality of Life Scores   Health/Function Pre 22.63 %    Health/Function Post 25.43 %    Health/Function % Change 12.37 %    Socioeconomic  Pre 25.21 %    Socioeconomic Post 23.86 %    Socioeconomic % Change  -5.36 %    Psych/Spiritual Pre 25.21 %    Psych/Spiritual Post 24 %    Psych/Spiritual % Change -4.8 %    Family Pre 22.6 %    Family Post 27.6 %    Family % Change 22.12 %    GLOBAL Pre 23.79 %    GLOBAL Post 25.13 %    GLOBAL % Change 5.63 %           Personal Goals: Goals established at orientation with interventions provided to work toward goal.  Personal Goals and Risk Factors at Admission - 04/01/20 1139      Core Components/Risk Factors/Patient Goals on Admission    Weight Management Yes;Weight Maintenance    Intervention Weight Management: Develop a combined nutrition and exercise program designed to reach desired caloric intake, while maintaining appropriate intake of nutrient and fiber, sodium and fats, and appropriate energy expenditure required for the weight goal.;Weight Management: Provide education and appropriate resources to help participant work on and attain dietary goals.    Expected Outcomes Weight Maintenance: Understanding of the daily nutrition guidelines, which includes 25-35% calories from fat, 7% or less cal from saturated fats, less than '200mg'$  cholesterol, less than 1.5gm of sodium, & 5 or more servings of fruits and vegetables daily;Understanding recommendations for meals to include 15-35% energy as protein, 25-35% energy from fat, 35-60% energy from carbohydrates, less than '200mg'$  of dietary cholesterol, 20-35 gm of total fiber daily;Understanding of distribution of calorie intake throughout the day with the consumption of 4-5 meals/snacks            Personal Goals Discharge:  Goals and Risk Factor Review    Row Name 04/12/20 1133 05/12/20 1008 06/01/20 1128 06/15/20 1051       Core Components/Risk Factors/Patient Goals  Review   Personal Goals Review Weight Management/Obesity;Lipids;Hypertension Weight Management/Obesity;Lipids;Hypertension -- --    Review Mr. Cuff has multiple CAD  risk factors. He is currently walking 2 hours a day and eager to participate in CR for education and risk factor modification. His goals are to have the stamina and strength to return to jogging for exercise and hopes to be able to continue to play golf and jog for time to come. Mr. Sakata has multiple CAD risk factors. He is currently walking at home and eager to continue participation in CR for education and risk factor modification. His goals are to have the stamina and strength to return to jogging for exercise and hopes to be able to continue to play golf and jog for time to come. He currently feels he is ready to begin a jogging program and will work with EP for goal setting. He continues to take all medications as prescribe and his BP is well within goal parameters. Mr. Thackston has multiple CAD risk factors. He is currently walking at home and eager to continue participation in CR for education and risk factor modification. His goals are to have the stamina and strength to return to jogging for exercise and hopes to be able to continue to play golf and jog for time to come. He currently feels he is ready to begin a jogging program and has worked with EP for goal setting. He continues to take all medications as prescribe and his BP is well within goal parameters. --    Expected Outcomes Paitent will continue to participate in CR for risk factor modification Paitent will continue to participate in CR for risk factor modification Paitent will continue to participate in CR for risk factor modification Mr. Mangino will continue exercise prescription and risk factor modifications learned in CR. He will continue to take all medications as prescribed, maintain a heart healthy diet, and attend all medical appointments a s scheduled.           Exercise Goals and Review:  Exercise Goals    Row Name 04/01/20 1038             Exercise Goals   Increase Physical Activity Yes       Intervention Provide advice,  education, support and counseling about physical activity/exercise needs.;Develop an individualized exercise prescription for aerobic and resistive training based on initial evaluation findings, risk stratification, comorbidities and participant's personal goals.       Expected Outcomes Short Term: Attend rehab on a regular basis to increase amount of physical activity.;Long Term: Exercising regularly at least 3-5 days a week.;Long Term: Add in home exercise to make exercise part of routine and to increase amount of physical activity.       Increase Strength and Stamina Yes       Intervention Provide advice, education, support and counseling about physical activity/exercise needs.;Develop an individualized exercise prescription for aerobic and resistive training based on initial evaluation findings, risk stratification, comorbidities and participant's personal goals.       Expected Outcomes Short Term: Increase workloads from initial exercise prescription for resistance, speed, and METs.;Short Term: Perform resistance training exercises routinely during rehab and add in resistance training at home;Long Term: Improve cardiorespiratory fitness, muscular endurance and strength as measured by increased METs and functional capacity ( )       Able to understand and use rate of perceived exertion (RPE) scale Yes       Intervention Provide education and explanation on how to use  RPE scale       Expected Outcomes Short Term: Able to use RPE daily in rehab to express subjective intensity level;Long Term:  Able to use RPE to guide intensity level when exercising independently       Knowledge and understanding of Target Heart Rate Range (THRR) Yes       Intervention Provide education and explanation of THRR including how the numbers were predicted and where they are located for reference       Expected Outcomes Short Term: Able to state/look up THRR;Long Term: Able to use THRR to govern intensity when exercising  independently;Short Term: Able to use daily as guideline for intensity in rehab       Able to check pulse independently Yes       Intervention Provide education and demonstration on how to check pulse in carotid and radial arteries.;Review the importance of being able to check your own pulse for safety during independent exercise       Expected Outcomes Short Term: Able to explain why pulse checking is important during independent exercise;Long Term: Able to check pulse independently and accurately       Understanding of Exercise Prescription Yes       Intervention Provide education, explanation, and written materials on patient's individual exercise prescription       Expected Outcomes Short Term: Able to explain program exercise prescription;Long Term: Able to explain home exercise prescription to exercise independently              Exercise Goals Re-Evaluation:  Exercise Goals Re-Evaluation    Row Name 04/12/20 0813 04/28/20 0704 05/12/20 0650 05/28/20 0725 06/04/20 0751     Exercise Goal Re-Evaluation   Exercise Goals Review Increase Physical Activity;Able to understand and use rate of perceived exertion (RPE) scale Increase Physical Activity;Able to understand and use rate of perceived exertion (RPE) scale;Increase Strength and Stamina;Knowledge and understanding of Target Heart Rate Range (THRR);Understanding of Exercise Prescription Increase Physical Activity;Able to understand and use rate of perceived exertion (RPE) scale;Increase Strength and Stamina;Knowledge and understanding of Target Heart Rate Range (THRR);Understanding of Exercise Prescription Increase Physical Activity;Able to understand and use rate of perceived exertion (RPE) scale;Increase Strength and Stamina;Knowledge and understanding of Target Heart Rate Range (THRR);Understanding of Exercise Prescription Increase Physical Activity;Able to understand and use rate of perceived exertion (RPE) scale;Increase Strength and  Stamina;Knowledge and understanding of Target Heart Rate Range (THRR);Understanding of Exercise Prescription   Comments Patient able to understand and use RPE scale appropriately. Reviewed home exercise guidelines with patient including endpoints, temperature precautions, target heart rate and rate of perceived exertion. Pt is walking 60 minutes on the days he doesn't attend cardiac rehab and at 30 minutes on the days he does attend cardiac rehab as his mode of home exercise. Patient has 2,5, and 8 lb weights at home that he can use for his resistance training. Patient not currently doing weights at home. Instructed patient on manual pulse counting, handout given. Pt voices understanding of instructions given. Patient is doing well with his exercise. Patient is walking daily and has no concerns at this time. Increasing workloads appropriately. Increased hand weights from 5 to 6lbs. Patient Browning he's been cleared to resume golf 05/21/20, which was one of his goals. Patient's functional capacity increased 23% as measured by the 6MWT, strength increased 11%, and flexibility increased 16%. Patient has progressed well achieving 6.2 METs with exercise. Patient feels he's achived his personal goals. Patient is walking at least 60 minutes  daily and has 2, 6, and 8 lb hand weights at home. Pt hasn't start resistance training at home but plans to upon graduation 2-4 days/week. Patient Browning he's been cleared to resume his goal activiites golf and resume running. Patient completed the phase 2 cardiac rehab program and will continue exercise at home: walking at least 60 minutes daily and using 2, 6, and 8 lb hand weights for his resistance training.   Expected Outcomes Increase workloads as tolerated to help increase strength and stamina. Patient will continue daily exercise routine to help achieve personal health and fitness goals. Patient will continue current daily exercise routine. Patient will continue current daily  exercise routine and will add resistance training at home upon completion of the program. Patient will continue walking 60 minutes daily and resistance training at least 2 days/week to help maintain health and fitness gains.          Nutrition & Weight - Outcomes:  Pre Biometrics - 04/01/20 1038      Pre Biometrics   Height 5' 9.75" (1.772 m)    Weight 191 lb 5.8 oz (86.8 kg)    Waist Circumference 40 inches    Hip Circumference 42 inches    Waist to Hip Ratio 0.95 %    BMI (Calculated) 27.64    Triceps Skinfold 12 mm    % Body Fat 37 %    Grip Strength 42 kg    Flexibility 12.5 in    Single Leg Stand 94 seconds           Post Biometrics - 06/04/20 0700       Post  Biometrics   Height 5' 9.75" (1.772 m)    Weight 191 lb 9.3 oz (86.9 kg)    Waist Circumference 37 inches    Hip Circumference 40.5 inches    Waist to Hip Ratio 0.91 %    BMI (Calculated) 27.68    Triceps Skinfold 12 mm    % Body Fat 24.8 %    Grip Strength 46.5 kg    Flexibility 14.5 in    Single Leg Stand 114 seconds           Nutrition:  Nutrition Therapy & Goals - 04/16/20 0818      Nutrition Therapy   Diet Heart Healthy      Personal Nutrition Goals   Nutrition Goal Pt to build a healthy plate including vegetables, fruits, whole grains, and low-fat dairy products in a heart healthy meal plan.    Personal Goal #2 Pt to increase fiber intake by 5 g daily by incorporating vegetables and whole grains for goal of 25 g/day      Intervention Plan   Intervention Nutrition handout(s) given to patient.;Prescribe, educate and counsel regarding individualized specific dietary modifications aiming towards targeted core components such as weight, hypertension, lipid management, diabetes, heart failure and other comorbidities.    Expected Outcomes Short Term Goal: A plan has been developed with personal nutrition goals set during dietitian appointment.;Long Term Goal: Adherence to prescribed nutrition plan.             Nutrition Discharge:  Nutrition Assessments - 06/10/20 0850      MEDFICTS Scores   Post Score 88           Education Questionnaire Score:  Knowledge Questionnaire Score - 05/31/20 1557      Knowledge Questionnaire Score   Pre Score 21/24    Post Score 23/24  Goals reviewed with patient; copy given to patient.

## 2020-06-12 ENCOUNTER — Other Ambulatory Visit: Payer: Self-pay | Admitting: Cardiology

## 2020-06-25 ENCOUNTER — Ambulatory Visit: Payer: BC Managed Care – PPO | Admitting: Pharmacist

## 2020-06-25 ENCOUNTER — Ambulatory Visit: Payer: BC Managed Care – PPO

## 2020-06-25 ENCOUNTER — Other Ambulatory Visit: Payer: Self-pay

## 2020-06-25 DIAGNOSIS — Z9889 Other specified postprocedural states: Secondary | ICD-10-CM

## 2020-06-25 DIAGNOSIS — I4891 Unspecified atrial fibrillation: Secondary | ICD-10-CM | POA: Diagnosis not present

## 2020-06-25 DIAGNOSIS — Z7901 Long term (current) use of anticoagulants: Secondary | ICD-10-CM | POA: Diagnosis not present

## 2020-06-25 DIAGNOSIS — Z0189 Encounter for other specified special examinations: Secondary | ICD-10-CM | POA: Diagnosis not present

## 2020-06-25 DIAGNOSIS — Z5181 Encounter for therapeutic drug level monitoring: Secondary | ICD-10-CM | POA: Diagnosis not present

## 2020-06-25 LAB — POCT INR: INR: 2.6 (ref 2.0–3.0)

## 2020-06-25 NOTE — Progress Notes (Signed)
Anticoagulation Management Daniel Browning is a 64 y.o. male who reports to the clinic for monitoring of warfarin treatment.    Indication: Paroxysmal AFib s/p Maze procedure CHA2DS2 Vasc Score 1, HAS-BLED 0  Duration: 3 months (till June 2021) Supervising physician: [facility] Visit History:  Patient does not report signs/symptoms of bleeding or thromboembolism   Other recent changes: No changes in diet, medications, lifestyle. Pt finished his cardiac rehab and reports to be doing well since. Continues to exercise with walking/running at home. Reports to be tolerating the exercise regimen w/o any reported SOB or dyspnea.   Anticoagulation Episode Summary    Current INR goal:  2.0-3.0  TTR:  30.9 % (3.7 mo)  Next INR check:  07/23/2020  INR from last check:  2.6 (06/25/2020)  Weekly max warfarin dose:  5 mg  Target end date:    INR check location:    Preferred lab:    Send INR reminders to:     Indications   Atrial fibrillation (HCC) [I48.91] Monitoring for long-term anticoagulant use [Z51.81 Z79.01]       Comments:         No Known Allergies  Current Outpatient Medications:  .  acetaminophen (TYLENOL) 500 MG tablet, Take 2 tablets (1,000 mg total) by mouth every 6 (six) hours., Disp: 30 tablet, Rfl: 0 .  amoxicillin (AMOXIL) 500 MG tablet, Take 1 tablet (500 mg total) by mouth once as needed for up to 1 dose. 1 hour before dental procedure (Patient taking differently: Take 2,000 mg by mouth once as needed. 1 hour before dental procedure), Disp: 4 tablet, Rfl: 4 .  aspirin EC 81 MG EC tablet, Take 1 tablet (81 mg total) by mouth daily., Disp: , Rfl:  .  metoprolol tartrate (LOPRESSOR) 25 MG tablet, TAKE 1/2 TABLET TWICE A DAY, Disp: 30 tablet, Rfl: 1 .  nitroGLYCERIN (NITROSTAT) 0.4 MG SL tablet, Place under the tongue., Disp: , Rfl:  .  omeprazole (PRILOSEC OTC) 20 MG tablet, Take 20 mg by mouth daily., Disp: , Rfl:  .  rosuvastatin (CRESTOR) 10  MG tablet, Take 1 tablet (10 mg total) by mouth daily., Disp: 90 tablet, Rfl: 3 .  warfarin (COUMADIN) 5 MG tablet, Take 1 tablet by mouth once daily as directed. Refer to most recent coumadin instruction sheet for most accurate dosing instructions., Disp: 90 tablet, Rfl: 1 Past Medical History:  Diagnosis Date  . Atrial fibrillation ([place])   . Colon polyps   . Coronary artery disease   . GERD (gastroesophageal reflux disease)   . Heart murmur   . Mitral valve prolapse   . Nonrheumatic mitral valve regurgitation   . Persistent atrial fibrillation ([name])   . S/P CABG x 1 02/19/2020   LIMA to LAD  . S/P Maze operation for atrial fibrillation 02/19/2020   Complete bilateral atrial lesion set using bipolar radiofrequency ablation and cryothermy with clipping of LA appendage via median sternotomy approach  . S/P mitral valve repair 02/19/2020   Complex valvuloplasty including artificial Gore-tex neochord placement x8, decalcification of posterior leaflet and posterior annulus with sliding leaflet plasty and 34 mm Sorin Memo 4D ring annuloplasty   ASSESSMENT  Recent Results: The most recent result is correlated with 67.5 mg per week:  Lab Results  Component Value Date   INR 2.6 06/25/2020   INR 2.3 06/08/2020   INR 1.7 (A) 05/25/2020    Anticoagulation Dosing: Description   INR at goal. Continue weekly dose of  7.5 mg on Mon and 10 mg all other days. Recheck INR in 4 weeks      INR today: Therapeutic on current dose. Pt is scheduled for a 3 month ECHO f/u on 7/16. Pt to be evaluated for continued anticoagulation therapy based on ECHO results. Will discontinue from service if pt is cleared based on ECHO results.   PLAN Weekly dose was unchanged. Continue taking 7.5 mg every Mon and 10 mg all other days. Recheck INR in 3 weeks  Patient Instructions  INR at goal. Continue weekly dose of 7.5 mg on Mon and 10 mg all other days. Recheck INR in 4 weeks  Patient advised to contact clinic or  seek medical attention if signs/symptoms of bleeding or thromboembolism occur.  Patient verbalized understanding by repeating back information and was advised to contact me if further medication-related questions arise.   Follow-up Return in about 4 weeks (around 07/23/2020).  Alysia Penna, PharmD  15 minutes spent face-to-face with the patient during the encounter. 50% of time spent on education, including signs/sx bleeding and clotting, as well as food and drug interactions with warfarin. 50% of time was spent on fingerprick POC INR sample collection,processing, results determination, and documentation

## 2020-06-25 NOTE — Patient Instructions (Signed)
INR at goal. Continue weekly dose of 7.5 mg on Mon and 10 mg all other days. Recheck INR in 4 weeks

## 2020-06-30 DIAGNOSIS — L570 Actinic keratosis: Secondary | ICD-10-CM | POA: Diagnosis not present

## 2020-07-01 ENCOUNTER — Telehealth: Payer: Self-pay

## 2020-07-01 NOTE — Telephone Encounter (Signed)
Had not called since he had a f/u visit. Heart function has improved, nearly normalized.   Thanks MJP

## 2020-07-01 NOTE — Telephone Encounter (Signed)
Telephone encounter:  Reason for call: pt calling for echo results   Usual provider: MP  Last office visit: 04/2720  Next office visit: 07/23/20   Last hospitalization: NA   Current Outpatient Medications on File Prior to Visit  Medication Sig Dispense Refill  . acetaminophen (TYLENOL) 500 MG tablet Take 2 tablets (1,000 mg total) by mouth every 6 (six) hours. 30 tablet 0  . amoxicillin (AMOXIL) 500 MG tablet Take 1 tablet (500 mg total) by mouth once as needed for up to 1 dose. 1 hour before dental procedure (Patient taking differently: Take 2,000 mg by mouth once as needed. 1 hour before dental procedure) 4 tablet 4  . aspirin EC 81 MG EC tablet Take 1 tablet (81 mg total) by mouth daily.    . metoprolol tartrate (LOPRESSOR) 25 MG tablet TAKE 1/2 TABLET TWICE A DAY 30 tablet 1  . nitroGLYCERIN (NITROSTAT) 0.4 MG SL tablet Place under the tongue.    Marland Kitchen omeprazole (PRILOSEC OTC) 20 MG tablet Take 20 mg by mouth daily.    . rosuvastatin (CRESTOR) 10 MG tablet Take 1 tablet (10 mg total) by mouth daily. 90 tablet 3  . warfarin (COUMADIN) 5 MG tablet Take 1 tablet by mouth once daily as directed. Refer to most recent coumadin instruction sheet for most accurate dosing instructions. 90 tablet 1   No current facility-administered medications on file prior to visit.

## 2020-07-02 NOTE — Telephone Encounter (Signed)
Yes. He has completed 3 months after CABG, mitral vale repair and Maze procedure. Can stop warfarin.Continue Aspirin 81 mg.   Thanks MJP

## 2020-07-09 ENCOUNTER — Other Ambulatory Visit: Payer: Self-pay | Admitting: Cardiology

## 2020-07-26 ENCOUNTER — Encounter: Payer: Self-pay | Admitting: Gastroenterology

## 2020-07-26 ENCOUNTER — Ambulatory Visit (INDEPENDENT_AMBULATORY_CARE_PROVIDER_SITE_OTHER): Payer: BC Managed Care – PPO | Admitting: Gastroenterology

## 2020-07-26 VITALS — BP 110/60 | HR 88 | Ht 70.0 in | Wt 193.0 lb

## 2020-07-26 DIAGNOSIS — K219 Gastro-esophageal reflux disease without esophagitis: Secondary | ICD-10-CM

## 2020-07-26 DIAGNOSIS — K921 Melena: Secondary | ICD-10-CM | POA: Diagnosis not present

## 2020-07-26 DIAGNOSIS — Z86010 Personal history of colonic polyps: Secondary | ICD-10-CM

## 2020-07-26 MED ORDER — NA SULFATE-K SULFATE-MG SULF 17.5-3.13-1.6 GM/177ML PO SOLN
1.0000 | Freq: Once | ORAL | 0 refills | Status: AC
Start: 2020-07-26 — End: 2020-07-26

## 2020-07-26 NOTE — Progress Notes (Signed)
History of Present Illness: This is a 64 year old male referred by [name], MD for the evaluation of personal history of adenomatous colon polyps and small-volume hematochezia.  He underwent mitral valve repair, CABG x1 and maze procedure in March 2021.  He has made a very good recovery but still relates some decreased exercise tolerance.  His chronic GERD is well controlled on daily omeprazole.  Colonoscopy in July 2009 showed a 4 mm tubular adenoma and internal hemorrhoids.  He was recommended to have a 5-year interval surveillance colonoscopy however he did not return.  He notes small amounts of bright red blood per rectum after wiping.  He states the symptoms have been present for many years and are annoying but they have not increased in frequency or severity.  He was maintained on warfarin for atrial fibrillation however this was discontinued a couple months ago. Denies weight loss, abdominal pain, constipation, diarrhea, change in stool caliber, melena, nausea, vomiting, dysphagia, chest pain.    No Known Allergies Outpatient Medications Prior to Visit  Medication Sig Dispense Refill  . amoxicillin (AMOXIL) 500 MG tablet Take 1 tablet (500 mg total) by mouth once as needed for up to 1 dose. 1 hour before dental procedure (Patient taking differently: Take 2,000 mg by mouth once as needed. 1 hour before dental procedure) 4 tablet 4  . aspirin EC 81 MG EC tablet Take 1 tablet (81 mg total) by mouth daily.    . metoprolol tartrate (LOPRESSOR) 25 MG tablet TAKE 1/2 TABLET TWICE A DAY 30 tablet 1  . nitroGLYCERIN (NITROSTAT) 0.4 MG SL tablet Place under the tongue.    Marland Kitchen omeprazole (PRILOSEC OTC) 20 MG tablet Take 20 mg by mouth daily.    . rosuvastatin (CRESTOR) 10 MG tablet Take 1 tablet (10 mg total) by mouth daily. 90 tablet 3  . acetaminophen (TYLENOL) 500 MG tablet Take 2 tablets (1,000 mg total) by mouth every 6 (six) hours. 30 tablet 0   No facility-administered medications prior  to visit.   Past Medical History:  Diagnosis Date  . Atrial fibrillation ([place])   . Coronary artery disease   . GERD (gastroesophageal reflux disease)   . Heart murmur   . Mitral valve prolapse   . Nonrheumatic mitral valve regurgitation   . Persistent atrial fibrillation ([place])   . S/P CABG x 1 02/19/2020   LIMA to LAD  . S/P Maze operation for atrial fibrillation 02/19/2020   Complete bilateral atrial lesion set using bipolar radiofrequency ablation and cryothermy with clipping of LA appendage via median sternotomy approach  . S/P mitral valve repair 02/19/2020   Complex valvuloplasty including artificial Gore-tex neochord placement x8, decalcification of posterior leaflet and posterior annulus with sliding leaflet plasty and 34 mm Sorin Memo 4D ring annuloplasty  . Tubular adenoma of colon 06/2008   Past Surgical History:  Procedure Laterality Date  . APPENDECTOMY  1982  . CARDIAC CATHETERIZATION    . CLIPPING OF ATRIAL APPENDAGE N/A 02/19/2020   Procedure: Clipping Of Atrial Appendage Using AtriCure PROClip size 45MM;  Surgeon: Rexene Alberts, MD;  Location: [facility];  Service: Open Heart Surgery;  Laterality: N/A;  . COLONOSCOPY    . CORONARY ARTERY BYPASS GRAFT N/A 02/19/2020   Procedure: CORONARY ARTERY BYPASS GRAFTING (CABG) x ONE, USING LEFT INTERNAL MAMMARY ARTERY;  Surgeon: Rexene Alberts, MD;  Location: [facility];  Service: Open Heart Surgery;  Laterality: N/A;  . HIP SURGERY Right 2008   8-9 screws- s/p  30 ft fall  . MAZE N/A 02/19/2020   Procedure: MAZE;  Surgeon: Rexene Alberts, MD;  Location: [facility];  Service: Open Heart Surgery;  Laterality: N/A;  . MITRAL VALVE REPAIR N/A 02/19/2020   Procedure: MITRAL VALVE REPAIR (MVR) USING MEMO 4D RING SIZE 34MM;  Surgeon: Rexene Alberts, MD;  Location: [facility];  Service: Open Heart Surgery;  Laterality: N/A;  . POLYPECTOMY    . radial head surgery Right 2008   from 30 ft. fall-   . RIGHT/LEFT HEART CATH AND CORONARY ANGIOGRAPHY N/A  02/03/2020   Procedure: RIGHT/LEFT HEART CATH AND CORONARY ANGIOGRAPHY;  Surgeon: Nigel Mormon, MD;  Location: [facility];  Service: Cardiovascular;  Laterality: N/A;  . TEE WITHOUT CARDIOVERSION N/A 02/03/2020   Procedure: TRANSESOPHAGEAL ECHOCARDIOGRAM (TEE);  Surgeon: Nigel Mormon, MD;  Location: [facility];  Service: Cardiovascular;  Laterality: N/A;  . TEE WITHOUT CARDIOVERSION N/A 02/19/2020   Procedure: TRANSESOPHAGEAL ECHOCARDIOGRAM (TEE);  Surgeon: Rexene Alberts, MD;  Location: [facility];  Service: Open Heart Surgery;  Laterality: N/A;  . UPPER GASTROINTESTINAL ENDOSCOPY     Social History   Socioeconomic History  . Marital status: Married    Spouse name: Not on file  . Number of children: 3  . Years of education: 23  . Highest education level: Not on file  Occupational History  . Not on file  Tobacco Use  . Smoking status: Never Smoker  . Smokeless tobacco: Never Used  Vaping Use  . Vaping Use: Never used  Substance and Sexual Activity  . Alcohol use: Yes    Comment: rarely  . Drug use: No  . Sexual activity: Not on file  Other Topics Concern  . Not on file  Social History Narrative  . Not on file   Social Determinants of Health   Financial Resource Strain:   . Difficulty of Paying Living Expenses:   Food Insecurity:   . Worried About Charity fundraiser in the Last Year:   . Arboriculturist in the Last Year:   Transportation Needs:   . Film/video editor (Medical):   Marland Kitchen Lack of Transportation (Non-Medical):   Physical Activity:   . Days of Exercise per Week:   . Minutes of Exercise per Session:   Stress:   . Feeling of Stress :   Social Connections:   . Frequency of Communication with Friends and Family:   . Frequency of Social Gatherings with Friends and Family:   . Attends Religious Services:   . Active Member of Clubs or Organizations:   . Attends Archivist Meetings:   Marland Kitchen Marital Status:    Family History    Problem Relation Age of Onset  . Lung cancer Mother 39  . Aneurysm Father 44  . Colon polyps Neg Hx   . Colon cancer Neg Hx   . Esophageal cancer Neg Hx   . Rectal cancer Neg Hx   . Stomach cancer Neg Hx       Review of Systems: Pertinent positive and negative review of systems were noted in the above HPI section. All other review of systems were otherwise negative.   Physical Exam: General: Well developed, well nourished, no acute distress Head: Normocephalic and atraumatic Eyes:  sclerae anicteric, EOMI Ears: Normal auditory acuity Mouth: Not examined, mask on during Covid-19 pandemic Neck: Supple, no masses or thyromegaly Lungs: Clear throughout to auscultation Heart: Regular rate and rhythm; no murmurs, rubs or  bruits Abdomen: Soft, non tender and non distended. No masses, hepatosplenomegaly or hernias noted. Normal Bowel sounds Rectal: Deferred to colonoscopy Musculoskeletal: Symmetrical with no gross deformities  Skin: No lesions on visible extremities Pulses:  Normal pulses noted Extremities: No clubbing, cyanosis, edema or deformities noted Neurological: Alert oriented x 4, grossly nonfocal Cervical Nodes:  No significant cervical adenopathy Inguinal Nodes: No significant inguinal adenopathy Psychological:  Alert and cooperative. Normal mood and affect   Assessment and Recommendations:  1.  Personal history of adenomatous colon polyps in 2009.  History of internal hemorrhoids.  Intermittent small-volume hematochezia.  Schedule colonoscopy. The risks (including bleeding, perforation, infection, missed lesions, medication reactions and possible hospitalization or surgery if complications occur), benefits, and alternatives to colonoscopy with possible biopsy and possible polypectomy were discussed with the patient and they consent to proceed.   2.  GERD.  Continue omeprazole 20 mg p.o. daily.  Follow standard antireflux measures.  3.  Status post mitral valve repair,  CABG x1, maze procedure in March 2021.   cc: [name], MD [address] [place],  [place] [address]

## 2020-07-26 NOTE — Patient Instructions (Signed)
You have been scheduled for a colonoscopy. Please follow written instructions given to you at your visit today.  Please pick up your prep supplies at the pharmacy within the next 1-3 days. If you use inhalers (even only as needed), please bring them with you on the day of your procedure.  Thank you for choosing me and [facility].  Malcolm T. Stark, Jr., MD., FACG  

## 2020-08-01 ENCOUNTER — Other Ambulatory Visit: Payer: Self-pay | Admitting: Cardiology

## 2020-08-04 ENCOUNTER — Encounter: Payer: BC Managed Care – PPO | Admitting: Gastroenterology

## 2020-08-17 ENCOUNTER — Other Ambulatory Visit: Payer: Self-pay | Admitting: Cardiology

## 2020-09-09 ENCOUNTER — Telehealth: Payer: Self-pay | Admitting: Gastroenterology

## 2020-09-09 MED ORDER — NA SULFATE-K SULFATE-MG SULF 17.5-3.13-1.6 GM/177ML PO SOLN
1.0000 | Freq: Once | ORAL | 0 refills | Status: AC
Start: 2020-09-09 — End: 2020-09-09

## 2020-09-09 NOTE — Telephone Encounter (Signed)
Prescription was sent in day of appt. Prescription for Suprep was resent to pharmacy per patient's request.

## 2020-09-09 NOTE — Telephone Encounter (Signed)
Patient called requesting that his prep medication gets called into pharmacy

## 2020-09-13 ENCOUNTER — Telehealth: Payer: Self-pay | Admitting: Gastroenterology

## 2020-09-13 MED ORDER — NA SULFATE-K SULFATE-MG SULF 17.5-3.13-1.6 GM/177ML PO SOLN: 1.0000 | Freq: Once | ORAL | 0 refills | Status: AC

## 2020-09-13 NOTE — Telephone Encounter (Signed)
Prescription sent to patient's pharmacy.

## 2020-09-15 ENCOUNTER — Telehealth: Payer: Self-pay | Admitting: Gastroenterology

## 2020-09-15 MED ORDER — GOLYTELY 236 G PO SOLR: 4000.0000 mL | Freq: Once | ORAL | 0 refills | Status: AC

## 2020-09-15 NOTE — Telephone Encounter (Signed)
Left message for patient to return my call.

## 2020-09-15 NOTE — Telephone Encounter (Signed)
Patient states his insurance will not cover Suprep. Informed patient we can switch him to Golytely which is usually cheaper and covered my insurance. Informed patient I will send him new instructions on my chart this afternoon for the new prep. Patient verbalized understanding.

## 2020-09-17 ENCOUNTER — Ambulatory Visit (AMBULATORY_SURGERY_CENTER): Payer: BC Managed Care – PPO | Admitting: Gastroenterology

## 2020-09-17 ENCOUNTER — Encounter: Payer: Self-pay | Admitting: Gastroenterology

## 2020-09-17 ENCOUNTER — Other Ambulatory Visit: Payer: Self-pay

## 2020-09-17 VITALS — BP 127/78 | HR 63 | Temp 97.3°F | Resp 13 | Ht 70.0 in | Wt 193.0 lb

## 2020-09-17 DIAGNOSIS — K635 Polyp of colon: Secondary | ICD-10-CM | POA: Diagnosis not present

## 2020-09-17 DIAGNOSIS — K621 Rectal polyp: Secondary | ICD-10-CM | POA: Diagnosis not present

## 2020-09-17 DIAGNOSIS — Z86010 Personal history of colonic polyps: Secondary | ICD-10-CM | POA: Diagnosis not present

## 2020-09-17 DIAGNOSIS — D128 Benign neoplasm of rectum: Secondary | ICD-10-CM

## 2020-09-17 DIAGNOSIS — Z1211 Encounter for screening for malignant neoplasm of colon: Secondary | ICD-10-CM | POA: Diagnosis not present

## 2020-09-17 DIAGNOSIS — D123 Benign neoplasm of transverse colon: Secondary | ICD-10-CM

## 2020-09-17 MED ORDER — SODIUM CHLORIDE 0.9 % IV SOLN: 500.0000 mL | Freq: Once | INTRAVENOUS | Status: DC

## 2020-09-17 NOTE — Progress Notes (Signed)
To PACU, VSS. Report to [email or web address] 

## 2020-09-17 NOTE — Patient Instructions (Signed)
Handouts provided on polyps, hemorrhoids and High-fiber diet.   Recommend High-fiber diet.   YOU HAD AN ENDOSCOPIC PROCEDURE TODAY AT [facility]:   Refer to the procedure report that was given to you for any specific questions about what was found during the examination.  If the procedure report does not answer your questions, please call your gastroenterologist to clarify.  If you requested that your care partner not be given the details of your procedure findings, then the procedure report has been included in a sealed envelope for you to review at your convenience later.  YOU SHOULD EXPECT: Some feelings of bloating in the abdomen. Passage of more gas than usual.  Walking can help get rid of the air that was put into your GI tract during the procedure and reduce the bloating. If you had a lower endoscopy (such as a colonoscopy or flexible sigmoidoscopy) you may notice spotting of blood in your stool or on the toilet paper. If you underwent a bowel prep for your procedure, you may not have a normal bowel movement for a few days.  Please Note:  You might notice some irritation and congestion in your nose or some drainage.  This is from the oxygen used during your procedure.  There is no need for concern and it should clear up in a day or so.  SYMPTOMS TO REPORT IMMEDIATELY:   Following lower endoscopy (colonoscopy or flexible sigmoidoscopy):  Excessive amounts of blood in the stool  Significant tenderness or worsening of abdominal pains  Swelling of the abdomen that is new, acute  Fever of 100F or higher  For urgent or emergent issues, a gastroenterologist can be reached at any hour by calling [phone]. Do not use MyChart messaging for urgent concerns.    DIET:  We do recommend a small meal at first, but then you may proceed to your regular diet.  Drink plenty of fluids but you should avoid alcoholic beverages for 24 hours.  ACTIVITY:  You should plan to take it  easy for the rest of today and you should NOT DRIVE or use heavy machinery until tomorrow (because of the sedation medicines used during the test).    FOLLOW UP: Our staff will call the number listed on your records 48-72 hours following your procedure to check on you and address any questions or concerns that you may have regarding the information given to you following your procedure. If we do not reach you, we will leave a message.  We will attempt to reach you two times.  During this call, we will ask if you have developed any symptoms of COVID 19. If you develop any symptoms (ie: fever, flu-like symptoms, shortness of breath, cough etc.) before then, please call [phone].  If you test positive for Covid 19 in the 2 weeks post procedure, please call and report this information to [place].    If any biopsies were taken you will be contacted by phone or by letter within the next 1-3 weeks.  Please call us at [phone] if you have not heard about the biopsies in 3 weeks.    SIGNATURES/CONFIDENTIALITY: You and/or your care partner have signed paperwork which will be entered into your electronic medical record.  These signatures attest to the fact that that the information above on your After Visit Summary has been reviewed and is understood.  Full responsibility of the confidentiality of this discharge information lies with you and/or your care-partner.

## 2020-09-17 NOTE — Op Note (Signed)
[facility] Patient Name: Daniel Browning Procedure Date: 09/17/2020 2:53 PM MRN: [id number] Endoscopist: Ladene Artist , MD Age: 64 Referring MD:  Date of Birth: Aug 26, 1956 Gender: Male Account #: [id number] Procedure:                Colonoscopy Indications:              Surveillance: Personal history of adenomatous                            polyps on last colonoscopy > 5 years ago Medicines:                Monitored Anesthesia Care Procedure:                Pre-Anesthesia Assessment:                           - Prior to the procedure, a History and Physical                            was performed, and patient medications and                            allergies were reviewed. The patient's tolerance of                            previous anesthesia was also reviewed. The risks                            and benefits of the procedure and the sedation                            options and risks were discussed with the patient.                            All questions were answered, and informed consent                            was obtained. Prior Anticoagulants: The patient has                            taken no previous anticoagulant or antiplatelet                            agents. ASA Grade Assessment: II - A patient with                            mild systemic disease. After reviewing the risks                            and benefits, the patient was deemed in                            satisfactory condition to undergo the procedure.  After obtaining informed consent, the colonoscope                            was passed under direct vision. Throughout the                            procedure, the patient's blood pressure, pulse, and                            oxygen saturations were monitored continuously. The                            Colonoscope was introduced through the anus and                            advanced to the the cecum,  identified by                            appendiceal orifice and ileocecal valve. The                            ileocecal valve, appendiceal orifice, and rectum                            were photographed. The quality of the bowel                            preparation was excellent. The colonoscopy was                            performed without difficulty. The patient tolerated                            the procedure well. Scope In: 2:59:07 PM Scope Out: 3:18:44 PM Scope Withdrawal Time: 0 hours 16 minutes 5 seconds  Total Procedure Duration: 0 hours 19 minutes 37 seconds  Findings:                 The perianal and digital rectal examinations were                            normal.                           Two sessile polyps were found in the rectum and                            transverse colon. The polyps were 6 mm in size.                            These polyps were removed with a cold snare.                            Resection and retrieval were complete.  Internal hemorrhoids were found during                            retroflexion. The hemorrhoids were small and Grade                            I (internal hemorrhoids that do not prolapse).                           The exam was otherwise without abnormality on                            direct and retroflexion views. Complications:            No immediate complications. Estimated blood loss:                            None. Estimated Blood Loss:     Estimated blood loss: none. Impression:               - Two 6 mm polyps in the rectum and in the                            transverse colon, removed with a cold snare.                            Resected and retrieved.                           - Internal hemorrhoids.                           - The examination was otherwise normal on direct                            and retroflexion views. Recommendation:           - Repeat colonoscopy after  studies are complete for                            surveillance based on pathology results.                           - Patient has a contact number available for                            emergencies. The signs and symptoms of potential                            delayed complications were discussed with the                            patient. Return to normal activities tomorrow.                            Written discharge instructions were provided to the  patient.                           - High fiber diet.                           - Continue present medications.                           - Await pathology results. Ladene Artist, MD 09/17/2020 3:21:43 PM This report has been signed electronically.

## 2020-09-17 NOTE — Progress Notes (Signed)
Vital signs checked by:CW ? ?The medical and surgical history was reviewed and verified with the patient. ? ?

## 2020-09-17 NOTE — Progress Notes (Signed)
Called to room to assist during endoscopic procedure.  Patient ID and intended procedure confirmed with present staff. Received instructions for my participation in the procedure from the performing physician.  

## 2020-09-21 ENCOUNTER — Telehealth: Payer: Self-pay | Admitting: *Deleted

## 2020-09-21 NOTE — Telephone Encounter (Signed)
  Follow up Call-  Call back number 09/17/2020  Post procedure Call Back phone  # [phone]  Permission to leave phone message Yes  Some recent data might be hidden     Patient questions:  Do you have a fever, pain , or abdominal swelling? No. Pain Score  0 *  Have you tolerated food without any problems? Yes.    Have you been able to return to your normal activities? Yes.    Do you have any questions about your discharge instructions: Diet   No. Medications  No. Follow up visit  No.  Do you have questions or concerns about your Care? No.  Actions: * If pain score is 4 or above: No action needed, pain <4  1. Have you developed a fever since your procedure? NO  2.   Have you had an respiratory symptoms (SOB or cough) since your procedure? NO  3.   Have you tested positive for COVID 19 since your procedure NO  4.   Have you had any family members/close contacts diagnosed with the COVID 19 since your procedure?  NO   If yes to any of these questions please route to Joylene John, RN and Joella Prince, RN

## 2020-09-27 ENCOUNTER — Encounter: Payer: Self-pay | Admitting: Gastroenterology

## 2020-11-08 DIAGNOSIS — E7800 Pure hypercholesterolemia, unspecified: Secondary | ICD-10-CM | POA: Diagnosis not present

## 2020-11-08 DIAGNOSIS — Z79899 Other long term (current) drug therapy: Secondary | ICD-10-CM | POA: Diagnosis not present

## 2020-11-08 DIAGNOSIS — Z0000 Encounter for general adult medical examination without abnormal findings: Secondary | ICD-10-CM | POA: Diagnosis not present

## 2021-02-03 ENCOUNTER — Other Ambulatory Visit: Payer: Self-pay | Admitting: Cardiology

## 2021-02-28 ENCOUNTER — Ambulatory Visit: Payer: BC Managed Care – PPO | Admitting: Thoracic Surgery (Cardiothoracic Vascular Surgery)

## 2021-03-14 ENCOUNTER — Other Ambulatory Visit: Payer: Self-pay

## 2021-03-14 ENCOUNTER — Ambulatory Visit (INDEPENDENT_AMBULATORY_CARE_PROVIDER_SITE_OTHER): Payer: BC Managed Care – PPO | Admitting: Thoracic Surgery (Cardiothoracic Vascular Surgery)

## 2021-03-14 ENCOUNTER — Encounter: Payer: Self-pay | Admitting: Thoracic Surgery (Cardiothoracic Vascular Surgery)

## 2021-03-14 VITALS — BP 128/84 | HR 82 | Resp 20 | Ht 70.0 in | Wt 196.0 lb

## 2021-03-14 DIAGNOSIS — Z8679 Personal history of other diseases of the circulatory system: Secondary | ICD-10-CM | POA: Diagnosis not present

## 2021-03-14 DIAGNOSIS — Z9889 Other specified postprocedural states: Secondary | ICD-10-CM

## 2021-03-14 DIAGNOSIS — Z951 Presence of aortocoronary bypass graft: Secondary | ICD-10-CM

## 2021-03-14 NOTE — Patient Instructions (Signed)

## 2021-03-14 NOTE — Progress Notes (Signed)
[address]       [place] [address]             [phone]     CARDIOTHORACIC SURGERY OFFICE NOTE  Referring Provider is Nigel Mormon, MD PCP is [name], MD   HPI:  Patient is a 65 year old male with history of mitral valve prolapse, coronary artery disease, persistent atrial fibrillation, and GE reflux disease who returns to the office today for routine follow-up status post mitral valve repair, coronary artery bypass grafting, and Maze procedure on February 19, 2020.  He was last seen here in our office on Apr 26, 2020 at which time he was doing well and maintaining sinus rhythm.  He underwent routine follow-up echocardiogram at Dr. Bonney Roussel office on June 25, 2020.  By report it revealed mildly decreased left ventricular function with ejection fraction estimated 45 to 50%.  Mitral valve repair appeared intact with reportedly mild residual mitral regurgitation.  He returns to our office for routine follow-up today.  He has been taken off of Coumadin and he denies any symptoms suggestive of recurrent atrial fibrillation or atrial flutter.  Overall he states that he is doing very well.  He is back to normal unrestricted physical activity.  He states that he no longer gets the burning epigastric pain in his chest with exertion that he previously had thought might be due to reflux.  He states that he also no longer gets symptoms of exertional shortness of breath with more strenuous exertion.   Current Outpatient Medications  Medication Sig Dispense Refill  . aspirin EC 81 MG EC tablet Take 1 tablet (81 mg total) by mouth daily.    . metoprolol tartrate (LOPRESSOR) 25 MG tablet TAKE 1/2 TABLET BY MOUTH TWICE A DAY 90 tablet 0  . nitroGLYCERIN (NITROSTAT) 0.4 MG SL tablet Place under the tongue.    Marland Kitchen omeprazole (PRILOSEC OTC) 20 MG tablet Take 20 mg by mouth daily.    . rosuvastatin (CRESTOR) 10 MG tablet Take 1 tablet (10 mg total) by mouth daily. 90  tablet 3   No current facility-administered medications for this visit.      Physical Exam:   BP 128/84 (BP Location: Right Arm, Patient Position: Sitting)   Pulse 82   Resp 20   Ht 5\' 10"  (1.778 m)   Wt 196 lb (88.9 kg)   SpO2 95% Comment: RA  BMI 28.12 kg/m   General:  Well-appearing  Chest:   Clear to auscultation with symmetrical breath sounds  CV:   Regular rate and rhythm without murmur  Incisions:  Completely healed  Abdomen:  Soft nontender  Extremities:  Warm and well-perfused  Diagnostic Tests:  2 channel telemetry rhythm strip demonstrates normal sinus rhythm   Impression:  Patient is doing well and appears to be maintaining sinus rhythm approximately 1 year status post mitral valve repair, coronary artery bypass grafting, and Maze procedure  Plan:  We have not recommended any change the patient's current medications.  The patient has been reminded regarding the importance of dental hygiene and the lifelong need for antibiotic prophylaxis for all dental cleanings and other related invasive procedures.  Patient will continue to follow-up intermittently with Dr. Virgina Jock.  He will return to our office in the future only should specific problems or questions arise.  I spent in excess of 15 minutes during the conduct of this office consultation and >50% of this time involved direct face-to-face encounter with the  patient for counseling and/or coordination of their care.   Valentina Gu. Roxy Manns, MD 03/14/2021 3:16 PM

## 2021-04-19 ENCOUNTER — Telehealth: Payer: Self-pay

## 2021-04-19 ENCOUNTER — Other Ambulatory Visit: Payer: Self-pay | Admitting: Cardiology

## 2021-04-19 DIAGNOSIS — Z298 Encounter for other specified prophylactic measures: Secondary | ICD-10-CM

## 2021-04-19 MED ORDER — AMOXICILLIN 500 MG PO TABS: 2000.0000 mg | ORAL_TABLET | ORAL | 3 refills | Status: AC

## 2021-04-19 NOTE — Telephone Encounter (Signed)
Dr. Zettie Cooley at [facility] called and would like to know if the pt can take antibiotics before a dental procedure. She would like [place] to fax this information to [phone].

## 2021-04-19 NOTE — Telephone Encounter (Signed)
Needs antibiotics prophylaxis with Amoxycillin 2 g 30-60 min before procedure (prescription sent). Rise Paganini, please send a pre-op letter with the above information.  Thanks MJP

## 2021-04-20 NOTE — Telephone Encounter (Signed)
[facility], she will be sending pre-op letter.

## 2021-04-23 ENCOUNTER — Other Ambulatory Visit: Payer: Self-pay | Admitting: Cardiology

## 2021-04-23 DIAGNOSIS — E782 Mixed hyperlipidemia: Secondary | ICD-10-CM

## 2021-05-08 ENCOUNTER — Other Ambulatory Visit: Payer: Self-pay | Admitting: Cardiology

## 2021-05-16 NOTE — Telephone Encounter (Signed)
Error

## 2021-07-03 IMAGING — DX DG CHEST 1V PORT
1 series · 1 of 1 positions shown · non-contrast
Comparison: 02/19/2020

CLINICAL DATA: Status post bypass surgery and MVR.

EXAM:
PORTABLE CHEST 1 VIEW

[chest]
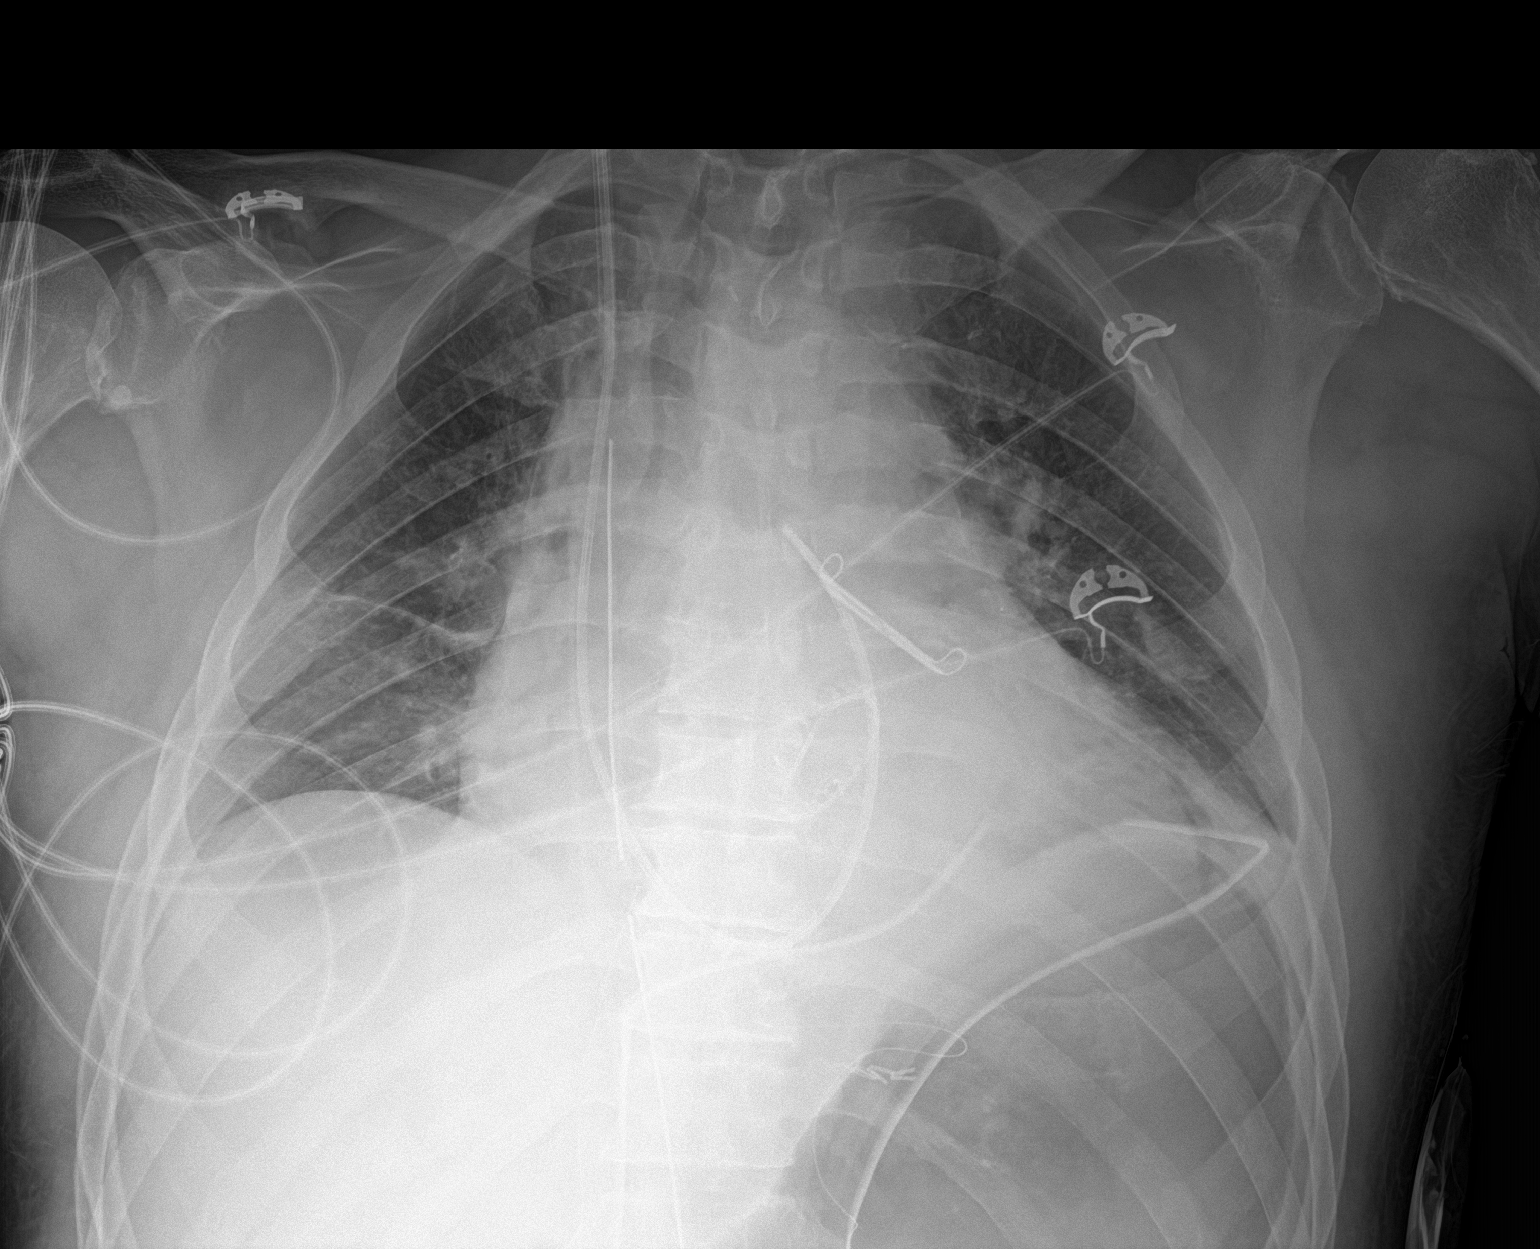

[1 of 1 positions shown; findings below may reference images not displayed]

FINDINGS: The endotracheal tube and NG tubes have been removed. The Swan-Ganz
catheter is stable with its tip in the proximal right pulmonary
artery. Stable left chest tube and mediastinal drain tubes.

Slightly lower lung volumes with streaky bibasilar atelectasis. No
edema, effusions or pneumothorax.
IMPRESSION: 1. Removal of endotracheal and NG tubes.
2. Remaining support apparatus is stable.
3. Slightly lower lung volumes with streaky bibasilar atelectasis.

## 2021-07-04 IMAGING — DX DG CHEST 1V PORT
1 series · 1 of 1 positions shown · non-contrast
Comparison: 02/20/2020.

CLINICAL DATA: Status post mitral valve repair.

EXAM:
PORTABLE CHEST 1 VIEW

[chest ap]
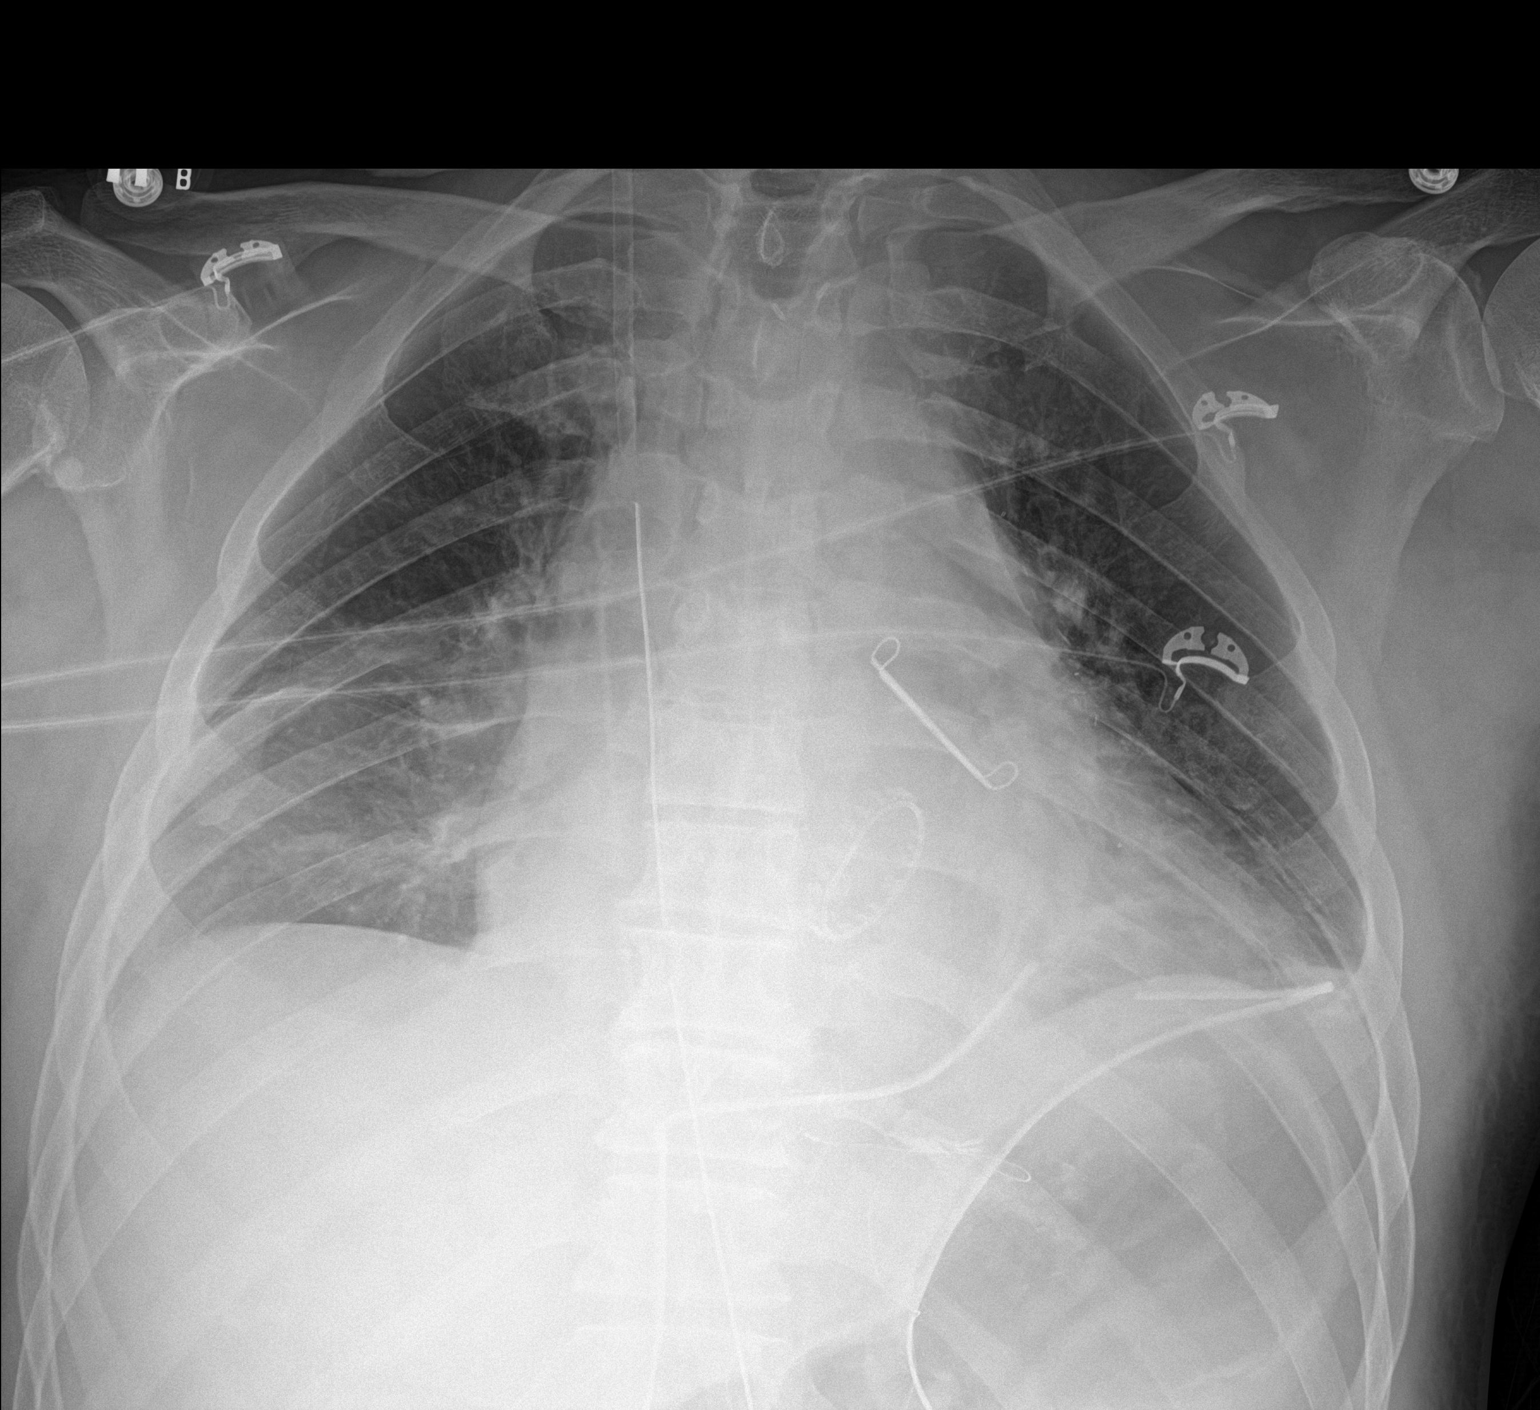

[1 of 1 positions shown; findings below may reference images not displayed]

FINDINGS: Opacity at the lung bases is consistent with small effusions and
atelectasis, similar to the previous day's exam. Lungs otherwise
clear.

There is no mediastinal widening.

No pneumothorax.

Left inferior hemithorax chest tube, mediastinal tubes and right
internal jugular Cordis remain in place, well positioned. The
Swan-Ganz catheter has been removed.
IMPRESSION: 1. No acute findings or evidence of an operative complication.
2. Mild persistent lung base opacities consistent with small
effusions and atelectasis.
3. Remaining support apparatus is well positioned.

## 2021-07-06 IMAGING — CR DG CHEST 2V
2 series · 2 of 2 positions shown · non-contrast
Comparison: Portable exam 4419 hours compared to 02/22/2020

CLINICAL DATA: Post MVR

EXAM:
CHEST - 2 VIEW

[chest pa]
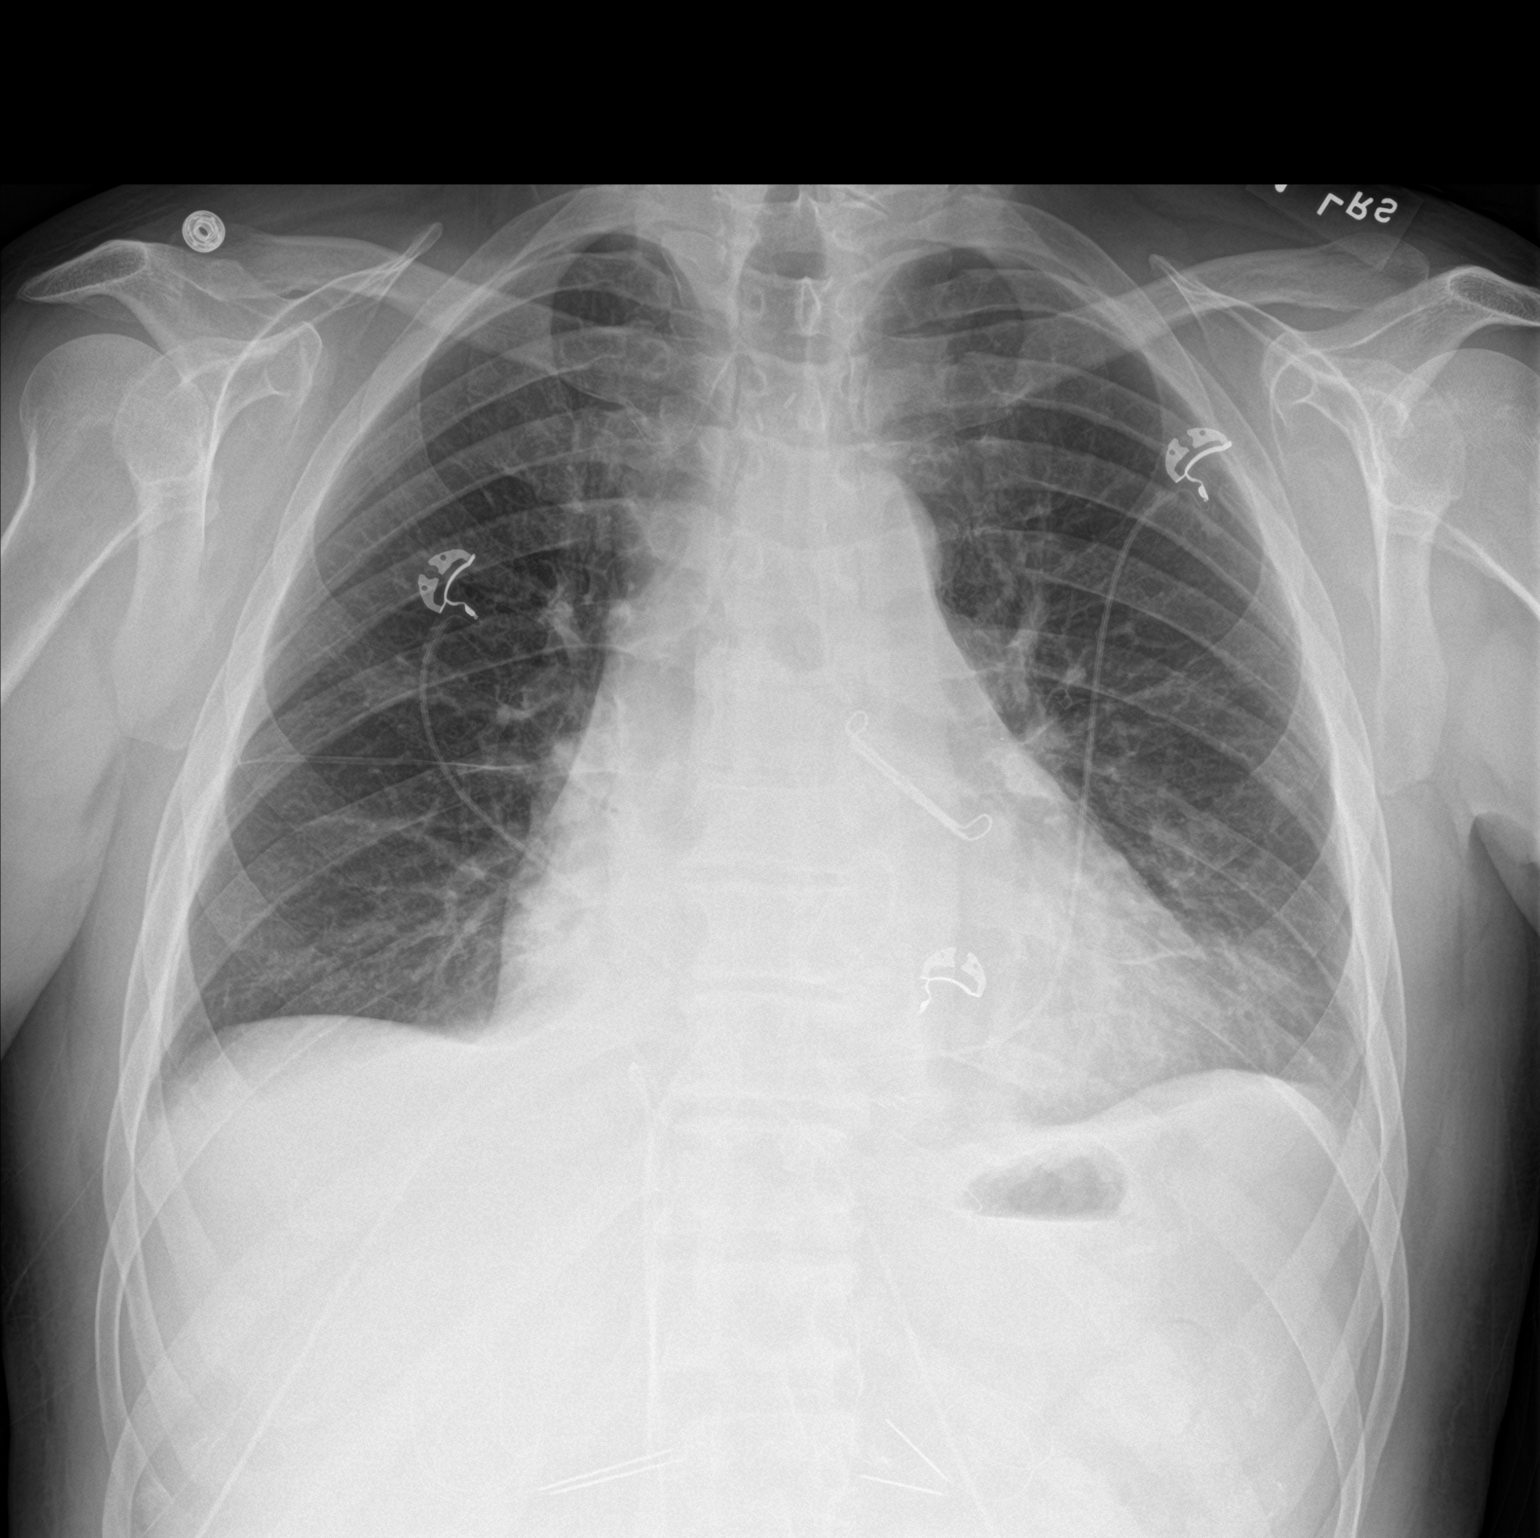

[chest lat]
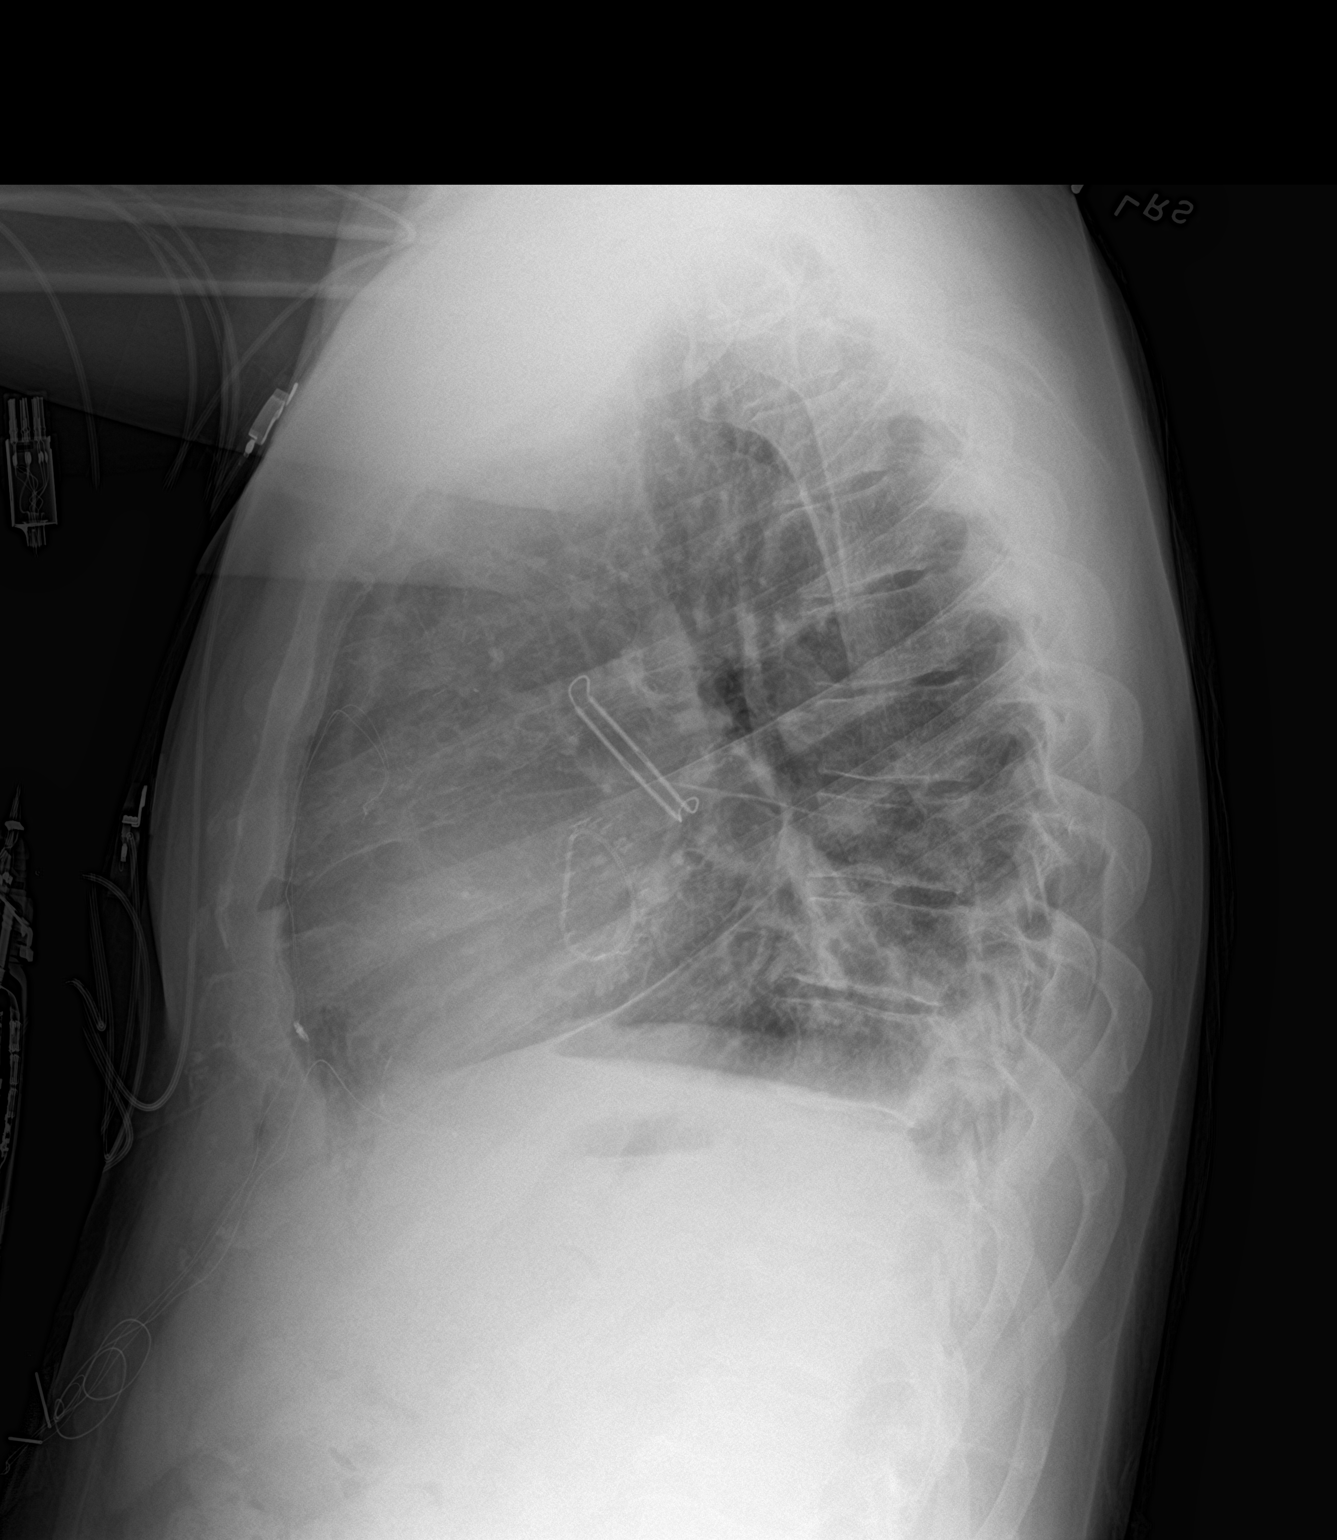

[2 of 2 positions shown; findings below may reference images not displayed]

FINDINGS: Interval removal of RIGHT jugular line.

Enlargement of cardiac silhouette post MVR and LEFT atrial appendage
clipping.

Mediastinal contours and pulmonary vascularity normal.

Minimal bibasilar atelectasis.

Lungs otherwise clear.

No acute infiltrate, pleural effusion or pneumothorax.
IMPRESSION: Enlargement of cardiac silhouette post MVR and Maze procedure.

Minimal bibasilar atelectasis.

## 2021-07-13 ENCOUNTER — Ambulatory Visit: Payer: BC Managed Care – PPO | Admitting: Cardiology

## 2021-08-08 NOTE — Progress Notes (Signed)
Patient referred by [name], MD for exertional chest pain  Subjective:   Daniel Browning, male    DOB: Sep 16, 1956, 65 y.o.   MRN: [id number]  Chief Complaint  Patient presents with   S/P mitral valve repair   Medication Management   Follow-up    HPI  65 y.o. Caucasian male with parxysmal Afib, severe MR, CAD (LAD CTO), now s/p complex mitral valvuloplasty, CABGX1 (LIMA-LAD), Maze procedure.  Patient is doing well.  In July 2022, he had an episode of pain in different parts of his chest, unrelated to activity.  Episode resolved on its own.  Since then, he has stayed active, walking for 10 miles at his work at [employer].  He also stays active with gardening.  He has not had any episode of chest pain, shortness of breath, orthopnea, PND etc.  Reviewed recent lab results from 10/2020 with the patient, details below.   Current Outpatient Medications on File Prior to Visit  Medication Sig Dispense Refill   amoxicillin (AMOXIL) 500 MG tablet Take 4 tablets (2,000 mg total) by mouth as directed. Take 30-60 min before dental procedure 10 tablet 3   aspirin EC 81 MG EC tablet Take 1 tablet (81 mg total) by mouth daily.     metoprolol tartrate (LOPRESSOR) 25 MG tablet TAKE 1/2 TABLET BY MOUTH TWICE A DAY 90 tablet 0   nitroGLYCERIN (NITROSTAT) 0.4 MG SL tablet Place under the tongue.     omeprazole (PRILOSEC OTC) 20 MG tablet Take 20 mg by mouth daily.     rosuvastatin (CRESTOR) 10 MG tablet TAKE 1 TABLET BY MOUTH EVERY DAY 90 tablet 3   No current facility-administered medications on file prior to visit.    Cardiovascular and other pertinent studies:  EKG 08/09/2021: Sinus rhythm 84 bpm Rightward axis Frequent PVC's  Echocardiogram 06/25/2020: Mildly depressed LV systolic function with visual EF 45-50%. Left  ventricle cavity is normal in size. Mild left ventricular hypertrophy.  Unable to evaluate diastolic function due to mitral valve repair. Left  ventricle regional wall motion  findings: Mid anteroseptal, Mid  inferoseptal, Apical septal and Apical cap hypokinesis. Calculated EF 47%.  Left atrial cavity is mildly dilated.  S/p mitral annuloplasty. Mean MG 40mHg at 74bpm, which is likely normal.  Mild (Grade I) mitral regurgitation.  Mild pulmonic regurgitation.  Compared to prior study on 03/17/2020: LVEF improved from 40% to 45-50% and  mild MR is new.   02/19/2020 (Dr. ORoxy Manns: Mitral Valve Repair             Complex valvuloplasty including artificial Gore-tex neochord placement x8             Decalcification of posterior leaflet and posterior annulus             Sliding leaflet plasty             Suture plication of anterior commissure and P1/P2             Sorin Memo 4D ring annuloplasty (size 3110m ref #[id number], serial #[id number]  Coronary Artery Bypass Grafting x 1              Left Internal Mammary Artery to Distal Left Anterior Descending Coronary Artery   Maze Procedure              complete bilateral atrial lesion set using bipolar radiofrequency and cryothermy ablation             clipping of left atrial  appendage (Atriclip size 64m)  TEE 02/03/2020: 1. Left ventricular ejection fraction, by estimation, is 55 to 60%. The left ventricle has normal function. The left ventricle has no regional  wall motion abnormalities. Left ventricular diastolic function could not be evaluated.  2. Right ventricular systolic function is normal. The right ventricular size is normal.  3. Left atrial size was severely dilated. No left atrial/left atrial appendage thrombus was detected.  4. 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  5. No other significant valvular abnormality.   Coronary angiogram 02/03/2020: LM: Normal LAD: Mid LAD CTO with faint collaterals from Diag and RCA LCx: Prox OM1 focal 40% stenosis RCA: Normal   RA: 6 mmHg RV: 31/2 mmHg PA: 36/14 mmHg, mean PAP 23 mmHg PCW: Mean 16 mmHg with tall V wave LVEDP 7  mmHg CO: 5.4 L/min CI: 2.6 L/min.m2   Impression: Single vessel obstructive CAD (LAD CTO) Nonobstructive prox OM1 disease Tall V waves s/o severe mitral regurgitation  EKG 01/07/2020: Atrial fibrillation with controlled ventricular rate 85 bpm. Occasional ectopic ventricular beat. Poor R wave progression.  Recent labs: 11/08/2020: Glucose 93, BUN/Cr 24/0.8. EGFR 92.  HbA1C 5.2% Chol 146, TG 79, HDL 54, LDL 77 TSH N/A  04/23/2020: INR 1.7  02/24/2020: Glucose 113, BUN/Cr 14/0.8. EGFR 60. Na/K 136/3.5. Ca 8.1. Rest of the CMP normal H/H 9.9/29. Platelets 125 HbA1C 5.2%  11/05/2019: Glucose 93, BUN/Cr 23/0.8. EGFR normal. Na/K 140/4.3. Rest of the CMP normal H/H 13.7/39.6. MCV 88.2. Platelets 192 Chol 222, TG 65, HDL 65, LDL 145   Review of Systems  Cardiovascular:  Negative for chest pain, dyspnea on exertion, leg swelling, palpitations and syncope.      Vitals:   08/09/21 1401  BP: 118/75  Pulse: 79  Resp: 16  SpO2: 97%    Body mass index is 27.84 kg/m. Filed Weights   08/09/21 1401  Weight: 194 lb (88 kg)     Objective:   Physical Exam Vitals and nursing note reviewed.  Constitutional:      General: He is not in acute distress. Neck:     Vascular: No JVD.  Cardiovascular:     Rate and Rhythm: Normal rate and regular rhythm. FrequentExtrasystoles are present.    Pulses: Intact distal pulses.     Heart sounds: Normal heart sounds. No murmur heard. Pulmonary:     Effort: Pulmonary effort is normal.     Breath sounds: Normal breath sounds. No wheezing or rales.  Musculoskeletal:     Right lower leg: No edema.     Left lower leg: No edema.        Assessment & Recommendations:   65y.o. Caucasian male with parxysmal Afib, severe MR, CAD (LAD CTO), now s/p complex mitral valvuloplasty, CABGX1 (LIMA-LAD), Maze procedure.  PVC:  Change metoprolol tartrate 12.5 mg twice daily to metoprolol succinate 50 mg daily.  Will reassess EF on echocardiogram.   I also placed on 1 week cardiac telemetry.    Paroxysmal Afib: CHA2DS2VASc score 1, annual stroke risk 0.6%.  S/p maze procedure.  Low stroke risk, thus not on anticoagulation.  No A. fib recurrence since then.  Continue aspirin 81 mg.  CAD: S/p CABG (LIMA-LAD) No angina symptoms.  Continue Aspirin 81 mg, Crestor 10 mg. Lipids reasonably well controlled.  S/p mitral valve repair: Continue aspirin 81 mg daily. Will need antibiotic prophylaxis for dental procedures  F/u in 4 weeks  Blu Lori JEsther Hardy MD [facility]. [facility] Pager: [phone]  Office: [phone]

## 2021-08-09 ENCOUNTER — Other Ambulatory Visit: Payer: Self-pay

## 2021-08-09 ENCOUNTER — Encounter: Payer: Self-pay | Admitting: Cardiology

## 2021-08-09 ENCOUNTER — Ambulatory Visit: Payer: BC Managed Care – PPO | Admitting: Cardiology

## 2021-08-09 VITALS — BP 118/75 | HR 79 | Resp 16 | Ht 70.0 in | Wt 194.0 lb

## 2021-08-09 DIAGNOSIS — Z9889 Other specified postprocedural states: Secondary | ICD-10-CM

## 2021-08-09 DIAGNOSIS — I2510 Atherosclerotic heart disease of native coronary artery without angina pectoris: Secondary | ICD-10-CM | POA: Diagnosis not present

## 2021-08-09 DIAGNOSIS — I480 Paroxysmal atrial fibrillation: Secondary | ICD-10-CM | POA: Diagnosis not present

## 2021-08-09 DIAGNOSIS — I493 Ventricular premature depolarization: Secondary | ICD-10-CM | POA: Diagnosis not present

## 2021-08-09 MED ORDER — METOPROLOL SUCCINATE ER 50 MG PO TB24: 50.0000 mg | ORAL_TABLET | Freq: Every day | ORAL | 3 refills | Status: DC

## 2021-08-15 ENCOUNTER — Other Ambulatory Visit: Payer: Self-pay | Admitting: Cardiology

## 2021-08-17 ENCOUNTER — Other Ambulatory Visit: Payer: Self-pay

## 2021-08-17 ENCOUNTER — Inpatient Hospital Stay: Payer: BC Managed Care – PPO

## 2021-08-17 ENCOUNTER — Ambulatory Visit: Payer: BC Managed Care – PPO

## 2021-08-17 DIAGNOSIS — I493 Ventricular premature depolarization: Secondary | ICD-10-CM | POA: Diagnosis not present

## 2021-08-17 DIAGNOSIS — I2510 Atherosclerotic heart disease of native coronary artery without angina pectoris: Secondary | ICD-10-CM | POA: Diagnosis not present

## 2021-08-17 DIAGNOSIS — Z9889 Other specified postprocedural states: Secondary | ICD-10-CM | POA: Diagnosis not present

## 2021-08-17 DIAGNOSIS — I480 Paroxysmal atrial fibrillation: Secondary | ICD-10-CM | POA: Diagnosis not present

## 2021-08-29 DIAGNOSIS — Z9889 Other specified postprocedural states: Secondary | ICD-10-CM | POA: Diagnosis not present

## 2021-08-29 DIAGNOSIS — I493 Ventricular premature depolarization: Secondary | ICD-10-CM | POA: Diagnosis not present

## 2021-09-06 DIAGNOSIS — Z9889 Other specified postprocedural states: Secondary | ICD-10-CM | POA: Diagnosis not present

## 2021-09-06 DIAGNOSIS — I493 Ventricular premature depolarization: Secondary | ICD-10-CM | POA: Diagnosis not present

## 2021-09-09 ENCOUNTER — Ambulatory Visit: Payer: BC Managed Care – PPO | Admitting: Cardiology

## 2021-09-09 ENCOUNTER — Other Ambulatory Visit: Payer: Self-pay

## 2021-09-09 ENCOUNTER — Encounter: Payer: Self-pay | Admitting: Cardiology

## 2021-09-09 VITALS — BP 129/89 | HR 74 | Temp 97.8°F | Ht 70.0 in | Wt 192.8 lb

## 2021-09-09 DIAGNOSIS — I480 Paroxysmal atrial fibrillation: Secondary | ICD-10-CM

## 2021-09-09 DIAGNOSIS — I493 Ventricular premature depolarization: Secondary | ICD-10-CM | POA: Diagnosis not present

## 2021-09-09 DIAGNOSIS — I471 Supraventricular tachycardia, unspecified: Secondary | ICD-10-CM

## 2021-09-09 DIAGNOSIS — I2510 Atherosclerotic heart disease of native coronary artery without angina pectoris: Secondary | ICD-10-CM

## 2021-09-09 DIAGNOSIS — I4729 Other ventricular tachycardia: Secondary | ICD-10-CM

## 2021-09-09 DIAGNOSIS — I472 Ventricular tachycardia: Secondary | ICD-10-CM | POA: Diagnosis not present

## 2021-09-09 DIAGNOSIS — Z9889 Other specified postprocedural states: Secondary | ICD-10-CM

## 2021-09-09 NOTE — Progress Notes (Signed)
Patient referred by [name], MD for exertional chest pain  Subjective:   Daniel Browning, male    DOB: [birth year], 65 y.o.   MRN: [id number]  Chief Complaint  Patient presents with   pvc's   Follow-up   Results    HPI  65 y.o. Caucasian male with parxysmal Afib, severe MR, CAD (LAD CTO), now s/p complex mitral valvuloplasty, CABGX1 (LIMA-LAD), Maze procedure.  Patient is doing well and denies any complaints. Recent echocardiogram and monitor results reviewed with the patient, details below.   Current Outpatient Medications on File Prior to Visit  Medication Sig Dispense Refill   amoxicillin (AMOXIL) 500 MG tablet Take 4 tablets (2,000 mg total) by mouth as directed. Take 30-60 min before dental procedure 10 tablet 3   aspirin EC 81 MG EC tablet Take 1 tablet (81 mg total) by mouth daily.     metoprolol succinate (TOPROL-XL) 50 MG 24 hr tablet Take 1 tablet (50 mg total) by mouth daily. Take with or immediately following a meal. 30 tablet 3   nitroGLYCERIN (NITROSTAT) 0.4 MG SL tablet Place under the tongue.     omeprazole (PRILOSEC OTC) 20 MG tablet Take 20 mg by mouth daily.     rosuvastatin (CRESTOR) 10 MG tablet TAKE 1 TABLET BY MOUTH EVERY DAY 90 tablet 3   No current facility-administered medications on file prior to visit.    Cardiovascular and other pertinent studies:  Mobile cardiac telemetry 7 days 08/17/2021 - 08/24/2021: Dominant rhythm: Sinus. HR 49-120 bpm. Avg HR 72 bpm, in sinus rhythm. 64 episodes of SVT, fastest at 156 bpm for 10 beats, longest for 9 beats at 126 bpm. <1% isolated SVE, couplet/triplets. 4 episodes of VT, fastest and longest at 160 bpm for 5 beats 10.8% isolated VE-including ventricular bigeminy and trigeminy, 1.5% couplets, <1% triplets.  No atrial fibrillation/atrial flutter//high grade AV block, sinus pause >3sec noted. 0 patient triggered events.   Echocardiogram 08/17/2021:  Left ventricle cavity is normal in size. Moderate  concentric hypertrophy  of the left ventricle. Abnormal septal wall motion due to post-operative  coronary artery bypass graft. Normal LV systolic function with visual EF  55-60%. Indeterminate diastolic function.  S/p mitral valve repair. No evidence of mitral stenosis. Moderate (Grade  II) mitral regurgitation.  Normal right atrial pressure.  Compared to previous study on 06/25/2020, LVEF improved from 45-50%.   EKG 08/09/2021: Sinus rhythm 84 bpm Rightward axis Frequent PVC's  02/19/2020 (Dr. Roxy Manns): Mitral Valve Repair             Complex valvuloplasty including artificial Gore-tex neochord placement x8             Decalcification of posterior leaflet and posterior annulus             Sliding leaflet plasty             Suture plication of anterior commissure and P1/P2             Sorin Memo 4D ring annuloplasty (size 64m, ref #[id number], serial ##[id number]   Coronary Artery Bypass Grafting x 1              Left Internal Mammary Artery to Distal Left Anterior Descending Coronary Artery   Maze Procedure              complete bilateral atrial lesion set using bipolar radiofrequency and cryothermy ablation             clipping of left atrial  appendage (Atriclip size 89m)  TEE 02/03/2020: 1. Left ventricular ejection fraction, by estimation, is 55 to 60%. The left ventricle has normal function. The left ventricle has no regional  wall motion abnormalities. Left ventricular diastolic function could not be evaluated.  2. Right ventricular systolic function is normal. The right ventricular size is normal.  3. Left atrial size was severely dilated. No left atrial/left atrial appendage thrombus was detected.  4. 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  5. No other significant valvular abnormality.   Coronary angiogram 02/03/2020: LM: Normal LAD: Mid LAD CTO with faint collaterals from Diag and RCA LCx: Prox OM1 focal 40% stenosis RCA: Normal    RA: 6 mmHg RV: 31/2 mmHg PA: 36/14 mmHg, mean PAP 23 mmHg PCW: Mean 16 mmHg with tall V wave LVEDP 7 mmHg CO: 5.4 L/min CI: 2.6 L/min.m2   Impression: Single vessel obstructive CAD (LAD CTO) Nonobstructive prox OM1 disease Tall V waves s/o severe mitral regurgitation  EKG 01/07/2020: Atrial fibrillation with controlled ventricular rate 85 bpm. Occasional ectopic ventricular beat. Poor R wave progression.  Recent labs: 11/08/2020: Glucose 93, BUN/Cr 24/0.8. EGFR 92.  HbA1C 5.2% Chol 146, TG 79, HDL 54, LDL 77 TSH N/A  04/23/2020: INR 1.7  02/24/2020: Glucose 113, BUN/Cr 14/0.8. EGFR 60. Na/K 136/3.5. Ca 8.1. Rest of the CMP normal H/H 9.9/29. Platelets 125 HbA1C 5.2%  11/05/2019: Glucose 93, BUN/Cr 23/0.8. EGFR normal. Na/K 140/4.3. Rest of the CMP normal H/H 13.7/39.6. MCV 88.2. Platelets 192 Chol 222, TG 65, HDL 65, LDL 145   Review of Systems  Cardiovascular:  Negative for chest pain, dyspnea on exertion, leg swelling, palpitations and syncope.      Vitals:   09/09/21 1410  BP: 129/89  Pulse: 74  Temp: 97.8 F (36.6 C)  SpO2: 98%    Body mass index is 27.66 kg/m. Filed Weights   09/09/21 1410  Weight: 192 lb 12.8 oz (87.5 kg)     Objective:   Physical Exam Vitals and nursing note reviewed.  Constitutional:      General: He is not in acute distress. Neck:     Vascular: No JVD.  Cardiovascular:     Rate and Rhythm: Normal rate and regular rhythm. FrequentExtrasystoles are present.    Pulses: Intact distal pulses.     Heart sounds: Normal heart sounds. No murmur heard. Pulmonary:     Effort: Pulmonary effort is normal.     Breath sounds: Normal breath sounds. No wheezing or rales.  Musculoskeletal:     Right lower leg: No edema.     Left lower leg: No edema.        Assessment & Recommendations:   65y.o. Caucasian male with parxysmal Afib, severe MR, CAD (LAD CTO), now s/p complex mitral valvuloplasty, CABGX1 (LIMA-LAD), Maze  procedure.  PSVT/NSVT/VE: PVC burden 10.8% (08/2021) Normal EF, grade II MR (08/2021) Continue metoprolol succinate 50 mg daily.    Paroxysmal Afib: CHA2DS2VASc score 1, annual stroke risk 0.6%.  S/p maze procedure.  Low stroke risk, thus not on anticoagulation.  No A. fib recurrence since then.  Continue aspirin 81 mg.  CAD: S/p CABG (LIMA-LAD) No angina symptoms.  Continue Aspirin 81 mg, Crestor 10 mg. Lipids reasonably well controlled.  S/p mitral valve repair: Normal EF, grade II MR (08/2021) Continue aspirin 81 mg daily. Will need antibiotic prophylaxis for dental procedures  F/u in 6 months  Daniel Renault JEsther Hardy MD [facility]. [facility] Pager: [phone]: [phone]

## 2021-11-06 ENCOUNTER — Other Ambulatory Visit: Payer: Self-pay | Admitting: Cardiology

## 2021-11-06 DIAGNOSIS — I493 Ventricular premature depolarization: Secondary | ICD-10-CM

## 2021-11-09 DIAGNOSIS — Z23 Encounter for immunization: Secondary | ICD-10-CM | POA: Diagnosis not present

## 2021-11-09 DIAGNOSIS — E7800 Pure hypercholesterolemia, unspecified: Secondary | ICD-10-CM | POA: Diagnosis not present

## 2021-11-09 DIAGNOSIS — Z79899 Other long term (current) drug therapy: Secondary | ICD-10-CM | POA: Diagnosis not present

## 2021-11-09 DIAGNOSIS — Z125 Encounter for screening for malignant neoplasm of prostate: Secondary | ICD-10-CM | POA: Diagnosis not present

## 2021-11-09 DIAGNOSIS — Z0000 Encounter for general adult medical examination without abnormal findings: Secondary | ICD-10-CM | POA: Diagnosis not present

## 2022-03-09 ENCOUNTER — Encounter: Payer: Self-pay | Admitting: Cardiology

## 2022-03-09 ENCOUNTER — Ambulatory Visit: Payer: Self-pay | Admitting: Cardiology

## 2022-03-09 VITALS — BP 118/74 | HR 73 | Temp 97.8°F | Resp 16 | Ht 70.0 in | Wt 193.0 lb

## 2022-03-09 DIAGNOSIS — I480 Paroxysmal atrial fibrillation: Secondary | ICD-10-CM

## 2022-03-09 DIAGNOSIS — I471 Supraventricular tachycardia: Secondary | ICD-10-CM

## 2022-03-09 DIAGNOSIS — I4729 Other ventricular tachycardia: Secondary | ICD-10-CM

## 2022-03-09 DIAGNOSIS — I2510 Atherosclerotic heart disease of native coronary artery without angina pectoris: Secondary | ICD-10-CM

## 2022-03-09 NOTE — Progress Notes (Signed)
Error

## 2022-04-18 ENCOUNTER — Other Ambulatory Visit: Payer: Self-pay | Admitting: Cardiology

## 2022-04-18 DIAGNOSIS — E782 Mixed hyperlipidemia: Secondary | ICD-10-CM

## 2022-05-08 ENCOUNTER — Other Ambulatory Visit: Payer: Self-pay | Admitting: Cardiology

## 2022-05-08 DIAGNOSIS — I493 Ventricular premature depolarization: Secondary | ICD-10-CM

## 2022-08-28 ENCOUNTER — Ambulatory Visit: Payer: 59

## 2022-08-28 DIAGNOSIS — I2510 Atherosclerotic heart disease of native coronary artery without angina pectoris: Secondary | ICD-10-CM

## 2022-08-28 DIAGNOSIS — I480 Paroxysmal atrial fibrillation: Secondary | ICD-10-CM

## 2022-09-04 ENCOUNTER — Encounter: Payer: Self-pay | Admitting: Cardiology

## 2022-09-04 ENCOUNTER — Ambulatory Visit: Payer: 59 | Admitting: Cardiology

## 2022-09-04 VITALS — BP 118/74 | HR 63 | Temp 98.0°F | Resp 16 | Ht 70.0 in | Wt 190.0 lb

## 2022-09-04 DIAGNOSIS — E782 Mixed hyperlipidemia: Secondary | ICD-10-CM

## 2022-09-04 DIAGNOSIS — I2510 Atherosclerotic heart disease of native coronary artery without angina pectoris: Secondary | ICD-10-CM

## 2022-09-04 DIAGNOSIS — R5383 Other fatigue: Secondary | ICD-10-CM

## 2022-09-04 DIAGNOSIS — I471 Supraventricular tachycardia: Secondary | ICD-10-CM

## 2022-09-04 NOTE — Progress Notes (Signed)
Patient referred by [name], MD for exertional chest pain  Subjective:   Daniel Browning, male    DOB: 08-09-1956, 66 y.o.   MRN: [id number]  Chief Complaint  Patient presents with   S/P mitral valve repair   Follow-up    6 month     HPI  66 y.o. Caucasian male with parxysmal Afib, severe MR, CAD (LAD CTO), now s/p complex mitral valvuloplasty, CABGX1 (LIMA-LAD), Maze procedure.  Patient denies chest pain, shortness of breath, palpitations, leg edema, orthopnea, PND, TIA/syncope. He walks avg 8 miles a day. He has had recent generalized fatigue.    Current Outpatient Medications:    amoxicillin (AMOXIL) 500 MG tablet, Take 4 tablets (2,000 mg total) by mouth as directed. Take 30-60 min before dental procedure, Disp: 10 tablet, Rfl: 3   aspirin EC 81 MG EC tablet, Take 1 tablet (81 mg total) by mouth daily., Disp: , Rfl:    metoprolol succinate (TOPROL-XL) 50 MG 24 hr tablet, TAKE 1 TABLET BY MOUTH EVERY DAY WITH OR IMMEDIATELY FOLLOWING A MEAL, Disp: 90 tablet, Rfl: 1   nitroGLYCERIN (NITROSTAT) 0.4 MG SL tablet, Place under the tongue., Disp: , Rfl:    omeprazole (PRILOSEC OTC) 20 MG tablet, Take 20 mg by mouth daily., Disp: , Rfl:    rosuvastatin (CRESTOR) 10 MG tablet, TAKE 1 TABLET BY MOUTH EVERY DAY, Disp: 90 tablet, Rfl: 3  Cardiovascular and other pertinent studies:  Echocardiogram 08/28/2022:  Normal LV systolic function with EF 61%. Left ventricle cavity is normal  in size. Normal left ventricular wall thickness. Normal global wall  motion. Calculated EF 61%.  Left atrial cavity is mildly dilated.  Valvuloplasty of the mitral valve.  Mild to moderate mitral regurgitation.  Mitral valve ring repair of the mitral annulus. E-wave dominant mitral  inflow.  No change compared to 08/2021.   EKG 09/04/2022: Sinus rhythm 71 bpm First degree A-V block  Rightward axis  Mobile cardiac telemetry 7 days 08/17/2021 - 08/24/2021: Dominant rhythm: Sinus. HR 49-120 bpm. Avg  HR 72 bpm, in sinus rhythm. 64 episodes of SVT, fastest at 156 bpm for 10 beats, longest for 9 beats at 126 bpm. <1% isolated SVE, couplet/triplets. 4 episodes of VT, fastest and longest at 160 bpm for 5 beats 10.8% isolated VE-including ventricular bigeminy and trigeminy, 1.5% couplets, <1% triplets.  No atrial fibrillation/atrial flutter//high grade AV block, sinus pause >3sec noted. 0 patient triggered events.   Echocardiogram 08/17/2021:  Left ventricle cavity is normal in size. Moderate concentric hypertrophy  of the left ventricle. Abnormal septal wall motion due to post-operative  coronary artery bypass graft. Normal LV systolic function with visual EF  55-60%. Indeterminate diastolic function.  S/p mitral valve repair. No evidence of mitral stenosis. Moderate (Grade  II) mitral regurgitation.  Normal right atrial pressure.  Compared to previous study on 06/25/2020, LVEF improved from 45-50%.   02/19/2020 (Dr. Roxy Manns): Mitral Valve Repair             Complex valvuloplasty including artificial Gore-tex neochord placement x8             Decalcification of posterior leaflet and posterior annulus             Sliding leaflet plasty             Suture plication of anterior commissure and P1/P2             Sorin Memo 4D ring annuloplasty (size 40m, ref #[id number], serial ##[id number]  Coronary Artery Bypass Grafting x 1              Left Internal Mammary Artery to Distal Left Anterior Descending Coronary Artery   Maze Procedure              complete bilateral atrial lesion set using bipolar radiofrequency and cryothermy ablation             clipping of left atrial appendage (Atriclip size 77m)  TEE 02/03/2020: 1. Left ventricular ejection fraction, by estimation, is 55 to 60%. The left ventricle has normal function. The left ventricle has no regional  wall motion abnormalities. Left ventricular diastolic function could not be evaluated.  2. Right ventricular systolic function is normal. The  right ventricular size is normal.  3. Left atrial size was severely dilated. No left atrial/left atrial appendage thrombus was detected.  4. 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  5. No other significant valvular abnormality.   Coronary angiogram 02/03/2020: LM: Normal LAD: Mid LAD CTO with faint collaterals from Diag and RCA LCx: Prox OM1 focal 40% stenosis RCA: Normal   RA: 6 mmHg RV: 31/2 mmHg PA: 36/14 mmHg, mean PAP 23 mmHg PCW: Mean 16 mmHg with tall V wave LVEDP 7 mmHg CO: 5.4 L/min CI: 2.6 L/min.m2   Impression: Single vessel obstructive CAD (LAD CTO) Nonobstructive prox OM1 disease Tall V waves s/o severe mitral regurgitation  Recent labs: 11/09/2021: Glucose 84, BUN/Cr 25/1.06. EGFR 78 Na/K 140/4.5. Rest of the CMP normal Chol 142, TG 55, HDL 60, LDL 70   Review of Systems  Constitutional: Positive for malaise/fatigue.  Cardiovascular:  Negative for chest pain, dyspnea on exertion, leg swelling, palpitations and syncope.    Vitals:   09/04/22 1421  BP: 118/74  Pulse: 63  Resp: 16  Temp: 98 F (36.7 C)  SpO2: 97%    Body mass index is 27.26 kg/m. Filed Weights   09/04/22 1421  Weight: 190 lb (86.2 kg)     Objective:   Physical Exam Vitals and nursing note reviewed.  Constitutional:      General: He is not in acute distress. Neck:     Vascular: No JVD.  Cardiovascular:     Rate and Rhythm: Normal rate and regular rhythm.     Pulses: Intact distal pulses.     Heart sounds: Normal heart sounds. No murmur heard. Pulmonary:     Effort: Pulmonary effort is normal.     Breath sounds: Normal breath sounds. No wheezing or rales.  Musculoskeletal:     Right lower leg: No edema.     Left lower leg: No edema.    Assessment & Recommendations:   66y.o. Caucasian male with parxysmal Afib, severe MR, CAD (LAD CTO), now s/p complex mitral valvuloplasty, CABGX1 (LIMA-LAD), Maze  procedure.  PSVT/NSVT/VE: PVC burden 10.8% (08/2021) Normal EF, grade II MR (08/2021) Continue metoprolol succinate 50 mg daily.    Paroxysmal Afib: CHA2DS2VASc score 1, annual stroke risk 0.6%.  S/p maze procedure.  Low stroke risk, thus not on anticoagulation.  No A. fib recurrence since then.  Continue aspirin 81 mg.  CAD: S/p CABG (LIMA-LAD) No angina symptoms.  Continue Aspirin 81 mg, Crestor 10 mg. Labs reviewed, last LDL 70 (11/09/21).  S/p mitral valve repair: Normal EF, grade II MR (08/2021) Continue aspirin 81 mg daily. Will need antibiotic prophylaxis for dental procedures  Fatigue Do not feel that fatigue is of cardiac etiology. We discussed reversible causes of  fatigue including low thyroid and sleep apnea. He has an appointment with PCP in November and he was advised to discuss fatigue with PCP and have lab work done. I would like to see him back in one year and will review lab work completed by PCP at that time.   Nigel Mormon, MD [facility] Pager: [phone] Office: [phone]

## 2022-09-08 ENCOUNTER — Ambulatory Visit: Payer: 59 | Admitting: Cardiology

## 2022-11-02 ENCOUNTER — Other Ambulatory Visit: Payer: Self-pay | Admitting: Cardiology

## 2022-11-02 DIAGNOSIS — I493 Ventricular premature depolarization: Secondary | ICD-10-CM

## 2023-04-10 ENCOUNTER — Other Ambulatory Visit: Payer: Self-pay | Admitting: Cardiology

## 2023-04-10 DIAGNOSIS — E782 Mixed hyperlipidemia: Secondary | ICD-10-CM

## 2023-05-04 ENCOUNTER — Other Ambulatory Visit: Payer: Self-pay | Admitting: Cardiology

## 2023-05-04 DIAGNOSIS — I493 Ventricular premature depolarization: Secondary | ICD-10-CM

## 2023-05-09 ENCOUNTER — Other Ambulatory Visit: Payer: Self-pay

## 2023-05-09 DIAGNOSIS — E782 Mixed hyperlipidemia: Secondary | ICD-10-CM

## 2023-05-09 DIAGNOSIS — I493 Ventricular premature depolarization: Secondary | ICD-10-CM

## 2023-05-09 MED ORDER — METOPROLOL SUCCINATE ER 50 MG PO TB24: ORAL_TABLET | ORAL | 1 refills | Status: DC

## 2023-06-13 ENCOUNTER — Ambulatory Visit: Payer: 59 | Admitting: Cardiology

## 2023-06-28 ENCOUNTER — Encounter: Payer: Self-pay | Admitting: Cardiology

## 2023-06-28 ENCOUNTER — Ambulatory Visit: Payer: 59 | Admitting: Cardiology

## 2023-06-28 VITALS — BP 122/77 | HR 69 | Resp 16 | Ht 70.0 in | Wt 197.0 lb

## 2023-06-28 DIAGNOSIS — R0609 Other forms of dyspnea: Secondary | ICD-10-CM

## 2023-06-28 DIAGNOSIS — I2510 Atherosclerotic heart disease of native coronary artery without angina pectoris: Secondary | ICD-10-CM

## 2023-06-28 NOTE — Progress Notes (Signed)
Patient referred by Blair Heys, MD for exertional chest pain  Subjective:   Daniel Browning, male    DOB: Oct 22, 1956, 68 y.o.   MRN: [id number]  Chief Complaint  Patient presents with   Coronary Artery Disease   Follow-up    HPI  67 y.o. Caucasian male with parxysmal Afib, severe MR, CAD (LAD CTO), now s/p complex mitral valvuloplasty, CABGX1 (LIMA-LAD), Maze procedure.  He has noticed recent exertional dyspnea while walking up the hill. He is still able to walk for up to an hour on flat surface without any difficulty. He denies any chest pain.     Current Outpatient Medications:    amoxicillin (AMOXIL) 500 MG tablet, Take 4 tablets (2,000 mg total) by mouth as directed. Take 30-60 min before dental procedure, Disp: 10 tablet, Rfl: 3   aspirin EC 81 MG EC tablet, Take 1 tablet (81 mg total) by mouth daily., Disp: , Rfl:    metoprolol succinate (TOPROL-XL) 50 MG 24 hr tablet, TAKE 1 TABLET BY MOUTH EVERY DAY WITH OR IMMEDIATELY FOLLOWING A MEAL, Disp: 90 tablet, Rfl: 1   nitroGLYCERIN (NITROSTAT) 0.4 MG SL tablet, Place under the tongue., Disp: , Rfl:    omeprazole (PRILOSEC OTC) 20 MG tablet, Take 20 mg by mouth daily., Disp: , Rfl:    rosuvastatin (CRESTOR) 10 MG tablet, TAKE 1 TABLET BY MOUTH EVERY DAY, Disp: 90 tablet, Rfl: 3  Cardiovascular and other pertinent studies:  EKG 06/28/2023: Probable sinus rhythm 69 bpm IVCD  Echocardiogram 08/28/2022:  Normal LV systolic function with EF 61%. Left ventricle cavity is normal  in size. Normal left ventricular wall thickness. Normal global wall  motion. Calculated EF 61%.  Left atrial cavity is mildly dilated.  Valvuloplasty of the mitral valve.  Mild to moderate mitral regurgitation.  Mitral valve ring repair of the mitral annulus. E-wave dominant mitral  inflow.  No change compared to 08/2021.   Mobile cardiac telemetry 7 days 08/17/2021 - 08/24/2021: Dominant rhythm: Sinus. HR 49-120 bpm. Avg HR 72 bpm, in sinus  rhythm. 64 episodes of SVT, fastest at 156 bpm for 10 beats, longest for 9 beats at 126 bpm. <1% isolated SVE, couplet/triplets. 4 episodes of VT, fastest and longest at 160 bpm for 5 beats 10.8% isolated VE-including ventricular bigeminy and trigeminy, 1.5% couplets, <1% triplets.  No atrial fibrillation/atrial flutter//high grade AV block, sinus pause >3sec noted. 0 patient triggered events.   Echocardiogram 08/17/2021:  Left ventricle cavity is normal in size. Moderate concentric hypertrophy  of the left ventricle. Abnormal septal wall motion due to post-operative  coronary artery bypass graft. Normal LV systolic function with visual EF  55-60%. Indeterminate diastolic function.  S/p mitral valve repair. No evidence of mitral stenosis. Moderate (Grade  II) mitral regurgitation.  Normal right atrial pressure.  Compared to previous study on 06/25/2020, LVEF improved from 45-50%.   02/19/2020 (Dr. Cornelius Moras): Mitral Valve Repair             Complex valvuloplasty including artificial Gore-tex neochord placement x8             Decalcification of posterior leaflet and posterior annulus             Sliding leaflet plasty             Suture plication of anterior commissure and P1/P2             Sorin Memo 4D ring annuloplasty (size 34mm, ref #[id number], serial #[id number])   Coronary Artery  Bypass Grafting x 1              Left Internal Mammary Artery to Distal Left Anterior Descending Coronary Artery   Maze Procedure              complete bilateral atrial lesion set using bipolar radiofrequency and cryothermy ablation             clipping of left atrial appendage (Atriclip size 45mm)  TEE 02/03/2020: 1. Left ventricular ejection fraction, by estimation, is 55 to 60%. The left ventricle has normal function. The left ventricle has no regional  wall motion abnormalities. Left ventricular diastolic function could not be evaluated.  2. Right ventricular systolic function is normal. The right ventricular  size is normal.  3. Left atrial size was severely dilated. No left atrial/left atrial appendage thrombus was detected.  4. 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  5. No other significant valvular abnormality.   Coronary angiogram 02/03/2020: LM: Normal LAD: Mid LAD CTO with faint collaterals from Diag and RCA LCx: Prox OM1 focal 40% stenosis RCA: Normal   RA: 6 mmHg RV: 31/2 mmHg PA: 36/14 mmHg, mean PAP 23 mmHg PCW: Mean 16 mmHg with tall V wave LVEDP 7 mmHg CO: 5.4 L/min CI: 2.6 L/min.m2   Impression: Single vessel obstructive CAD (LAD CTO) Nonobstructive prox OM1 disease Tall V waves s/o severe mitral regurgitation  Recent labs: 11/10/2022: Glucose 88, BUN/Cr 17/0.8. EGFR 98. K 4.1 Chol 144, TG 52, HDL 63, LDL 70  11/09/2021: Glucose 84, BUN/Cr 25/1.06. EGFR 78 Na/K 140/4.5. Rest of the CMP normal Chol 142, TG 55, HDL 60, LDL 70   Review of Systems  Constitutional: Negative for malaise/fatigue.  Cardiovascular:  Positive for dyspnea on exertion. Negative for chest pain, leg swelling, palpitations and syncope.    Vitals:   06/28/23 1501  BP: 122/77  Pulse: 69  Resp: 16  SpO2: 99%     Body mass index is 28.27 kg/m. Filed Weights   06/28/23 1501  Weight: 197 lb (89.4 kg)      Objective:   Physical Exam Vitals and nursing note reviewed.  Constitutional:      General: He is not in acute distress. Neck:     Vascular: No JVD.  Cardiovascular:     Rate and Rhythm: Normal rate and regular rhythm.     Pulses: Intact distal pulses.     Heart sounds: Murmur heard.     High-pitched blowing holosystolic murmur is present with a grade of 1/6 at the apex.  Pulmonary:     Effort: Pulmonary effort is normal.     Breath sounds: Normal breath sounds. No wheezing or rales.  Musculoskeletal:     Right lower leg: No edema.     Left lower leg: No edema.    Assessment & Recommendations:   67 y.o. Caucasian male with  parxysmal Afib, severe MR, CAD (LAD CTO), now s/p complex mitral valvuloplasty, CABGX1 (LIMA-LAD), Maze procedure.  S/p mitral valve repair: Continue aspirin 81 mg daily. Will need antibiotic prophylaxis for dental procedures With recent exertional dyspnea without chest pain, recommend echocardiogram and exercise treadmill stress test.   PSVT/NSVT/VE: PVC burden 10.8% (08/2021) Normal EF, grade II MR (08/2021) Continue metoprolol succinate 50 mg daily.    Paroxysmal Afib: CHA2DS2VASc score 1, annual stroke risk 0.6%.  S/p maze procedure.  Low stroke risk, thus not on anticoagulation.  No A. fib recurrence since then.  Continue aspirin 81 mg.  CAD: S/p CABG (LIMA-LAD) No angina symptoms.  Continue Aspirin 81 mg, Crestor 10 mg. Chol 144, TG 52, HDL 63, LDL 70 (12/09/22).  F/u in 1 year, unless any significant abnormalities found on above testing.    Elder Negus, MD Pager: [phone] Office: [phone]

## 2023-07-30 ENCOUNTER — Ambulatory Visit: Payer: 59

## 2023-07-30 DIAGNOSIS — R0609 Other forms of dyspnea: Secondary | ICD-10-CM

## 2023-08-15 ENCOUNTER — Telehealth: Payer: Self-pay

## 2023-08-15 NOTE — Progress Notes (Signed)
Discussed stress test and echocardiogram results with the patient.  Echocardiogram with no worsening of mild to moderate MR, TR.  Equivocal EKG changes albeit with excellent exercise capacity.  With nonlimiting symptoms, continue medical management with metoprolol at this time.  If symptoms were to get worse, will consider exercise nuclear stress test.  Will arrange follow-up in 6 months.  Elder Negus, MD

## 2023-08-15 NOTE — Telephone Encounter (Signed)
Patient called patient asking for you could give him his echo and stress results please.

## 2023-08-16 ENCOUNTER — Other Ambulatory Visit: Payer: Self-pay | Admitting: Cardiology

## 2023-08-16 DIAGNOSIS — I493 Ventricular premature depolarization: Secondary | ICD-10-CM

## 2023-11-15 ENCOUNTER — Encounter: Payer: Self-pay | Admitting: Podiatry

## 2023-11-15 ENCOUNTER — Ambulatory Visit (INDEPENDENT_AMBULATORY_CARE_PROVIDER_SITE_OTHER): Payer: 59

## 2023-11-15 ENCOUNTER — Ambulatory Visit: Payer: 59 | Admitting: Podiatry

## 2023-11-15 VITALS — Ht 70.0 in | Wt 197.0 lb

## 2023-11-15 DIAGNOSIS — M722 Plantar fascial fibromatosis: Secondary | ICD-10-CM | POA: Diagnosis not present

## 2023-11-15 DIAGNOSIS — M79671 Pain in right foot: Secondary | ICD-10-CM

## 2023-11-15 MED ORDER — TRIAMCINOLONE ACETONIDE 10 MG/ML IJ SUSP
10.0000 mg | Freq: Once | INTRAMUSCULAR | Status: AC
Start: 2023-11-15 — End: 2023-11-15
  Administered 2023-11-15: 10 mg via INTRA_ARTICULAR

## 2023-11-15 MED ORDER — MELOXICAM 15 MG PO TABS: 15.0000 mg | ORAL_TABLET | Freq: Every day | ORAL | 2 refills | Status: DC

## 2023-11-15 NOTE — Progress Notes (Signed)
Subjective:   Patient ID: Daniel Browning, male   DOB: 67 y.o.   MRN: [id number]   HPI Patient presents with a lot of pain in the plantar aspect of the right heel and states that it has been very sore and also at times feels numb.  Patient does not smoke likes to be active   Review of Systems  All other systems reviewed and are negative.       Objective:  Physical Exam Vitals and nursing note reviewed.  Constitutional:      Appearance: He is well-developed.  Pulmonary:     Effort: Pulmonary effort is normal.  Musculoskeletal:        General: Normal range of motion.  Skin:    General: Skin is warm.  Neurological:     Mental Status: He is alert.     Neurovascular status intact muscle strength adequate range of motion adequate exquisite discomfort medial fascial band right at the insertional point of the tendon into the calcaneus with inflammation fluid and moderate cavus foot structure.  Good digital perfusion well-oriented x 3     Assessment:  Acute plantar fasciitis right with inflammation with moderate structural change     Plan:  H&P reviewed went ahead today sterile prep injected the plantar fascia at insertion 3 mg Kenalog 5 mg Xylocaine advised to go support therapy and reappoint to recheck may require more aggressive treatment pattern  X-rays indicate small spur no indication of stress fracture

## 2024-04-03 ENCOUNTER — Other Ambulatory Visit: Payer: Self-pay | Admitting: Cardiology

## 2024-04-03 DIAGNOSIS — E782 Mixed hyperlipidemia: Secondary | ICD-10-CM

## 2024-05-01 ENCOUNTER — Other Ambulatory Visit: Payer: Self-pay | Admitting: Cardiology

## 2024-05-01 DIAGNOSIS — I493 Ventricular premature depolarization: Secondary | ICD-10-CM

## 2024-07-21 ENCOUNTER — Other Ambulatory Visit: Payer: Self-pay | Admitting: Cardiology

## 2024-07-21 DIAGNOSIS — I493 Ventricular premature depolarization: Secondary | ICD-10-CM

## 2024-07-28 ENCOUNTER — Telehealth: Payer: Self-pay | Admitting: Cardiology

## 2024-07-28 NOTE — Telephone Encounter (Signed)
*  STAT* If patient is at the pharmacy, call can be transferred to refill team.   1. Which medications need to be refilled? (please list name of each medication and dose if known) metoprolol  succinate (TOPROL -XL) 50 MG 24 hr tablet rosuvastatin  (CRESTOR ) 10 MG tablet   2. Would you like to learn more about the convenience, safety, & potential cost savings by using the [facility]?No   3. Are you open to using the [facility] (Type [facility]. No   4. Which pharmacy/location (including street and city if local pharmacy) is medication to be sent to?[id number] - [place], [place] - [address]    5. Do they need a 30 day or 90 day supply? 90 day  Pt has scheduled appt. 09/25/24

## 2024-07-29 ENCOUNTER — Other Ambulatory Visit: Payer: Self-pay

## 2024-07-29 DIAGNOSIS — I493 Ventricular premature depolarization: Secondary | ICD-10-CM

## 2024-07-29 DIAGNOSIS — E782 Mixed hyperlipidemia: Secondary | ICD-10-CM

## 2024-07-29 MED ORDER — METOPROLOL SUCCINATE ER 50 MG PO TB24
ORAL_TABLET | ORAL | 0 refills | Status: DC
Start: 2024-07-29 — End: 2024-09-25

## 2024-07-29 MED ORDER — ROSUVASTATIN CALCIUM 10 MG PO TABS: 10.0000 mg | ORAL_TABLET | Freq: Every day | ORAL | 0 refills | Status: DC

## 2024-09-25 ENCOUNTER — Ambulatory Visit: Attending: Cardiology | Admitting: Cardiology

## 2024-09-25 ENCOUNTER — Encounter: Payer: Self-pay | Admitting: Cardiology

## 2024-09-25 VITALS — BP 110/80 | HR 73 | Ht 69.0 in | Wt 192.4 lb

## 2024-09-25 DIAGNOSIS — Z9889 Other specified postprocedural states: Secondary | ICD-10-CM | POA: Diagnosis not present

## 2024-09-25 DIAGNOSIS — I493 Ventricular premature depolarization: Secondary | ICD-10-CM

## 2024-09-25 DIAGNOSIS — I25810 Atherosclerosis of coronary artery bypass graft(s) without angina pectoris: Secondary | ICD-10-CM

## 2024-09-25 DIAGNOSIS — I4729 Other ventricular tachycardia: Secondary | ICD-10-CM

## 2024-09-25 DIAGNOSIS — I471 Supraventricular tachycardia, unspecified: Secondary | ICD-10-CM | POA: Diagnosis not present

## 2024-09-25 DIAGNOSIS — E782 Mixed hyperlipidemia: Secondary | ICD-10-CM

## 2024-09-25 MED ORDER — METOPROLOL SUCCINATE ER 50 MG PO TB24
ORAL_TABLET | ORAL | 3 refills | Status: AC
Start: 2024-09-25 — End: ?

## 2024-09-25 MED ORDER — NITROGLYCERIN 0.4 MG SL SUBL: 0.4000 mg | SUBLINGUAL_TABLET | SUBLINGUAL | 3 refills | Status: AC | PRN

## 2024-09-25 MED ORDER — ROSUVASTATIN CALCIUM 10 MG PO TABS: 10.0000 mg | ORAL_TABLET | Freq: Every day | ORAL | 3 refills | Status: AC

## 2024-09-25 NOTE — Progress Notes (Signed)
 Cardiology Office Note:  .   Date:  09/25/2024  ID:  Daniel Browning, DOB [birth year], MRN [id number] PCP: Hugh Charleston, MD (Inactive)  [facility] Providers Cardiologist:  Newman Lawrence, MD PCP: Hugh Charleston, MD (Inactive)  Chief Complaint  Patient presents with   Coronary Artery Disease     Daniel Browning is a 68 y.o. male with parxysmal Afib, severe MR, CAD (LAD CTO), now s/p complex mitral valvuloplasty, CABGX1 (LIMA-LAD), Maze procedure.   History of Present Illness  Patient is doing well.  He continues to walk regularly with only occasional exertional dyspnea with more than usual activity.  His chest pain is completely resolved since he started taking PPI regularly.     Vitals:   09/25/24 1512  BP: 110/80  Pulse: 73  SpO2: 96%      Review of Systems  Cardiovascular:  Positive for dyspnea on exertion. Negative for chest pain, leg swelling, palpitations and syncope.        Studies Reviewed: SABRA        EKG 09/25/2024: Suspect arm lead reversal, interpretation assumes no reversal Sinus rhythm with 1st degree A-V block Rightward axis Non-specific intra-ventricular conduction delay When compared with ECG of 20-Feb-2020 05:56, Questionable change in QRS axis Non-specific change in ST segment in Lateral leads T wave inversion no longer evident in Inferior leads T wave amplitude has decreased in Lateral leads QT has shortened      Exercise treadmill stress test 2024: Exercise treadmill stress test performed using Bruce protocol.  Patient exercised for a total of 9 minutes and 35 seconds, achieving 11.1 METS, and 108% of age predicted maximum heart rate.  Exercise capacity was excellent.  No chest pain reported.  Normal heart rate and hemodynamic response.  Stress EKG showed sinus tachycardia, significant motion artifact limits evaluation of ST-T abnormality. EKG at 00:59 min into recovery showed probable sinus tachycardia, frequent PVCs, 1-1.5 mm T  wave inversion in leads II, III, normalized by 2 min into recovery. These changes are equivocal for ischemia. Recommend clinical correlation.  Echocardiogram 2024:  Left ventricle cavity is normal in size. Normal left ventricular wall  thickness. Abnormal septal wall motion due to post-operative valve. Normal  LV systolic function with EF 52%. Diastolic function not assessed due to  post op valve status.   Left atrial cavity is mild to moderately dilated.  S/p Sorin Memo 4D ring annuloplasty. Mild to moderate mitral  regurgitation.  Mild pulmonic regurgitation.  No evidence of pulmonary hypertension.  No significant change compared to previous study on 08/28/2022.     Labs 11/2023: Chol 146, TG 54, HDL 64, LDL 70 Hb 11.7 Cr 1.1 TSH 1.8  Physical Exam Vitals and nursing note reviewed.  Constitutional:      General: He is not in acute distress. Neck:     Vascular: No JVD.  Cardiovascular:     Rate and Rhythm: Normal rate and regular rhythm.     Heart sounds: Murmur heard.     High-pitched blowing holosystolic murmur is present with a grade of 1/6 at the apex.  Pulmonary:     Effort: Pulmonary effort is normal.     Breath sounds: Normal breath sounds. No wheezing or rales.  Musculoskeletal:     Right lower leg: No edema.     Left lower leg: No edema.      VISIT DIAGNOSES:   ICD-10-CM   1. Coronary artery disease involving coronary bypass graft of native heart without angina pectoris  I25.810 EKG 12-Lead    2. S/P mitral valve repair  Z98.890 EKG 12-Lead    3. PSVT (paroxysmal supraventricular tachycardia)  I47.10 EKG 12-Lead    4. NSVT (nonsustained ventricular tachycardia)  I47.29 EKG 12-Lead    5. PVC (premature ventricular contraction)  I49.3 EKG 12-Lead    metoprolol  succinate (TOPROL -XL) 50 MG 24 hr tablet    6. Mixed hyperlipidemia  E78.2 rosuvastatin  (CRESTOR ) 10 MG tablet       Daniel Browning is a 68 y.o. male with parxysmal Afib, severe MR, CAD (LAD  CTO), now s/p complex mitral valvuloplasty, CABGX1 (LIMA-LAD), Maze procedure.  Assessment & Plan  S/p mitral valve repair: Continue aspirin  81 mg daily. Will need antibiotic prophylaxis for dental procedures  PSVT/NSVT/VE: No symptoms at this time. Continue metoprolol  succinate 50 mg daily.     Paroxysmal Afib: CHA2DS2VASc score 1, annual stroke risk 0.6%.  S/p maze procedure.  Low stroke risk, thus not on anticoagulation.  No A. fib recurrence since then.  Continue aspirin  81 mg.   CAD: S/p CABG (LIMA-LAD) No angina symptoms.  Continue Aspirin  81 mg, Crestor  10 mg. Lipids well-controlled.    Meds ordered this encounter  Medications   metoprolol  succinate (TOPROL -XL) 50 MG 24 hr tablet    Sig: TAKE 1 TABLET BY MOUTH EVERY DAY WITH OR IMMEDIATELY FOLLOWING A MEAL    Dispense:  90 tablet    Refill:  3   nitroGLYCERIN  (NITROSTAT ) 0.4 MG SL tablet    Sig: Place 1 tablet (0.4 mg total) under the tongue every 5 (five) minutes as needed for chest pain.    Dispense:  25 tablet    Refill:  3   rosuvastatin  (CRESTOR ) 10 MG tablet    Sig: Take 1 tablet (10 mg total) by mouth daily.    Dispense:  90 tablet    Refill:  3     F/u in 1 year  Signed, Newman JINNY Lawrence, MD
